# Patient Record
Sex: Female | Born: 1945 | Race: White | Hispanic: No | Smoking: Former smoker
Health system: Southern US, Community
[De-identification: ages and names within clinical notes are randomized; demographics above are authoritative.]

## PROBLEM LIST (undated history)

## (undated) ENCOUNTER — Emergency Department (HOSPITAL_COMMUNITY): Admission: EM | Discharge status: Against Medical Advice | Payer: Managed Care, Other (non HMO)

## (undated) DIAGNOSIS — I509 Heart failure, unspecified: Secondary | ICD-10-CM

## (undated) DIAGNOSIS — Z9889 Other specified postprocedural states: Secondary | ICD-10-CM

## (undated) DIAGNOSIS — E785 Hyperlipidemia, unspecified: Secondary | ICD-10-CM

## (undated) DIAGNOSIS — M1990 Unspecified osteoarthritis, unspecified site: Secondary | ICD-10-CM

## (undated) DIAGNOSIS — E559 Vitamin D deficiency, unspecified: Secondary | ICD-10-CM

## (undated) DIAGNOSIS — Z973 Presence of spectacles and contact lenses: Secondary | ICD-10-CM

## (undated) DIAGNOSIS — R112 Nausea with vomiting, unspecified: Secondary | ICD-10-CM

## (undated) DIAGNOSIS — K219 Gastro-esophageal reflux disease without esophagitis: Secondary | ICD-10-CM

## (undated) DIAGNOSIS — M25562 Pain in left knee: Secondary | ICD-10-CM

## (undated) DIAGNOSIS — Z86718 Personal history of other venous thrombosis and embolism: Principal | ICD-10-CM

## (undated) DIAGNOSIS — I10 Essential (primary) hypertension: Secondary | ICD-10-CM

## (undated) DIAGNOSIS — E538 Deficiency of other specified B group vitamins: Secondary | ICD-10-CM

## (undated) DIAGNOSIS — S83241A Other tear of medial meniscus, current injury, right knee, initial encounter: Secondary | ICD-10-CM

## (undated) DIAGNOSIS — I82409 Acute embolism and thrombosis of unspecified deep veins of unspecified lower extremity: Secondary | ICD-10-CM

## (undated) HISTORY — DX: Acute embolism and thrombosis of unspecified deep veins of unspecified lower extremity: I82.409

## (undated) HISTORY — DX: Heart failure, unspecified: I50.9

## (undated) HISTORY — DX: Hyperlipidemia, unspecified: E78.5

## (undated) HISTORY — DX: Other tear of medial meniscus, current injury, right knee, initial encounter: S83.241A

## (undated) HISTORY — PX: CYSTOSTOMY W/ BLADDER BIOPSY: SHX1431

## (undated) HISTORY — PX: BILATERAL SALPINGECTOMY: SHX5743

## (undated) HISTORY — PX: SKIN CANCER EXCISION: SHX779

## (undated) HISTORY — DX: Deficiency of other specified B group vitamins: E53.8

## (undated) HISTORY — PX: TONSILLECTOMY: SUR1361

## (undated) HISTORY — DX: Essential (primary) hypertension: I10

## (undated) HISTORY — DX: Pain in left knee: M25.562

## (undated) HISTORY — DX: Personal history of other venous thrombosis and embolism: Z86.718

## (undated) HISTORY — DX: Vitamin D deficiency, unspecified: E55.9

---

## 1973-07-05 HISTORY — PX: VAGINAL HYSTERECTOMY: SHX2639

## 1973-07-05 HISTORY — PX: APPENDECTOMY: SHX54

## 1973-07-05 HISTORY — PX: ABDOMINAL HYSTERECTOMY: SHX81

## 1987-07-06 HISTORY — PX: KNEE ARTHROSCOPY: SHX127

## 1997-12-18 ENCOUNTER — Encounter: Admission: RE | Admit: 1997-12-18 | Discharge: 1998-03-18 | Payer: Self-pay | Admitting: *Deleted

## 1998-01-17 ENCOUNTER — Ambulatory Visit (HOSPITAL_COMMUNITY): Admission: RE | Admit: 1998-01-17 | Discharge: 1998-01-17 | Payer: Self-pay | Admitting: Urology

## 1999-05-04 ENCOUNTER — Ambulatory Visit (HOSPITAL_COMMUNITY): Admission: RE | Admit: 1999-05-04 | Discharge: 1999-05-04 | Payer: Self-pay | Admitting: *Deleted

## 1999-12-25 ENCOUNTER — Other Ambulatory Visit: Admission: RE | Admit: 1999-12-25 | Discharge: 1999-12-25 | Payer: Self-pay | Admitting: Family Medicine

## 2000-01-22 ENCOUNTER — Encounter: Payer: Self-pay | Admitting: Family Medicine

## 2000-01-22 ENCOUNTER — Encounter: Admission: RE | Admit: 2000-01-22 | Discharge: 2000-01-22 | Payer: Self-pay

## 2000-03-29 ENCOUNTER — Ambulatory Visit (HOSPITAL_COMMUNITY): Admission: RE | Admit: 2000-03-29 | Discharge: 2000-03-29 | Payer: Self-pay | Admitting: Family Medicine

## 2000-03-29 ENCOUNTER — Encounter: Payer: Self-pay | Admitting: Family Medicine

## 2001-05-05 ENCOUNTER — Encounter: Admission: RE | Admit: 2001-05-05 | Discharge: 2001-05-05 | Payer: Self-pay | Admitting: Family Medicine

## 2001-05-05 ENCOUNTER — Encounter: Payer: Self-pay | Admitting: Family Medicine

## 2002-07-05 HISTORY — PX: REFRACTIVE SURGERY: SHX103

## 2003-12-24 ENCOUNTER — Emergency Department (HOSPITAL_COMMUNITY): Admission: EM | Admit: 2003-12-24 | Discharge: 2003-12-24 | Payer: Self-pay | Admitting: *Deleted

## 2003-12-25 ENCOUNTER — Ambulatory Visit (HOSPITAL_COMMUNITY): Admission: RE | Admit: 2003-12-25 | Discharge: 2003-12-25 | Payer: Self-pay | Admitting: *Deleted

## 2004-02-17 ENCOUNTER — Emergency Department (HOSPITAL_COMMUNITY): Admission: AC | Admit: 2004-02-17 | Discharge: 2004-02-17 | Payer: Self-pay

## 2004-03-03 ENCOUNTER — Ambulatory Visit (HOSPITAL_COMMUNITY): Admission: RE | Admit: 2004-03-03 | Discharge: 2004-03-03 | Payer: Self-pay | Admitting: Orthopedic Surgery

## 2004-12-21 ENCOUNTER — Other Ambulatory Visit: Admission: RE | Admit: 2004-12-21 | Discharge: 2004-12-21 | Payer: Self-pay | Admitting: Family Medicine

## 2005-07-05 DIAGNOSIS — I82409 Acute embolism and thrombosis of unspecified deep veins of unspecified lower extremity: Secondary | ICD-10-CM

## 2005-07-05 HISTORY — DX: Acute embolism and thrombosis of unspecified deep veins of unspecified lower extremity: I82.409

## 2005-07-05 HISTORY — PX: LEG SURGERY: SHX1003

## 2006-03-21 ENCOUNTER — Ambulatory Visit: Payer: Self-pay | Admitting: Gastroenterology

## 2006-12-29 ENCOUNTER — Encounter: Admission: RE | Admit: 2006-12-29 | Discharge: 2006-12-29 | Payer: Self-pay | Admitting: Family Medicine

## 2007-07-06 HISTORY — PX: CARPAL TUNNEL RELEASE: SHX101

## 2008-01-01 ENCOUNTER — Encounter: Admission: RE | Admit: 2008-01-01 | Discharge: 2008-01-01 | Payer: Self-pay | Admitting: Family Medicine

## 2008-07-23 ENCOUNTER — Ambulatory Visit: Payer: Self-pay | Admitting: Gastroenterology

## 2008-08-09 ENCOUNTER — Ambulatory Visit: Payer: Self-pay | Admitting: Gastroenterology

## 2009-12-23 ENCOUNTER — Encounter: Admission: RE | Admit: 2009-12-23 | Discharge: 2009-12-23 | Payer: Self-pay | Admitting: Family Medicine

## 2010-03-30 ENCOUNTER — Encounter
Admission: RE | Admit: 2010-03-30 | Discharge: 2010-06-28 | Payer: Self-pay | Source: Home / Self Care | Attending: Orthopedic Surgery | Admitting: Orthopedic Surgery

## 2010-07-26 ENCOUNTER — Encounter: Payer: Self-pay | Admitting: Family Medicine

## 2010-10-08 ENCOUNTER — Encounter (HOSPITAL_BASED_OUTPATIENT_CLINIC_OR_DEPARTMENT_OTHER)
Admission: RE | Admit: 2010-10-08 | Discharge: 2010-10-08 | Disposition: A | Discharge status: Home / Self Care | Payer: Managed Care, Other (non HMO) | Source: Ambulatory Visit | Attending: Specialist | Admitting: Specialist

## 2010-10-13 ENCOUNTER — Ambulatory Visit (HOSPITAL_BASED_OUTPATIENT_CLINIC_OR_DEPARTMENT_OTHER)
Admission: RE | Admit: 2010-10-13 | Discharge: 2010-10-13 | Disposition: A | Discharge status: Home / Self Care | Payer: Managed Care, Other (non HMO) | Source: Ambulatory Visit | Attending: Specialist | Admitting: Specialist

## 2010-10-13 DIAGNOSIS — E669 Obesity, unspecified: Secondary | ICD-10-CM | POA: Insufficient documentation

## 2010-10-13 DIAGNOSIS — G5600 Carpal tunnel syndrome, unspecified upper limb: Secondary | ICD-10-CM | POA: Insufficient documentation

## 2010-10-13 DIAGNOSIS — Z01812 Encounter for preprocedural laboratory examination: Secondary | ICD-10-CM | POA: Insufficient documentation

## 2010-10-13 LAB — POCT HEMOGLOBIN-HEMACUE: Hemoglobin: 14.7 g/dL (ref 12.0–15.0)

## 2010-10-24 NOTE — Op Note (Signed)
Kaitlin Watkins, Kaitlin Watkins NO.:  [id number]  MEDICAL RECORD NO.:  [id number]          PATIENT TYPE:  REC  LOCATION:  [facility]H                         FACILITY:  [facility]  PHYSICIAN:  Kerrin Champagne, M.D.   DATE OF BIRTH:  [birth year]  DATE OF PROCEDURE:  10/13/2010 DATE OF DISCHARGE:  06/28/2010                              OPERATIVE REPORT   PREOPERATIVE DIAGNOSIS:  Left moderately severe carpal tunnel syndrome.  POSTOPERATIVE DIAGNOSIS:  Left moderately severe carpal tunnel syndrome.  PROCEDURE:  Left open carpal tunnel release.  SURGEON:  Kerrin Champagne, M.D.  ASSISTANT:  None.  ANESTHESIA:  Regional using a Bier block at the upper forearm level. Dr. Krista Blue was the anesthesiologist of record.  Local anesthetic, Marcaine 0.5% plain, 7 mL.  FINDINGS:  Waist-like constriction of left median nerve and transverse carpal ligament.  ESTIMATED BLOOD LOSS:  10 mL.  COMPLICATIONS:  None.  The patient returned to the PACU in good condition.  HISTORY OF PRESENT ILLNESS:  A 65 year old right-handed female.  She has been experiencing worsening severe pain in the left arm, particularly at nighttime.  Pain was repetitive usually to the left hand when driving. She also is experiencing neck pain.  She underwent an evaluation of her neck, left upper extremity EMGs, nerve conduction studies demonstrating moderately severe left carpal tunnel syndrome.  She has not responded to use of splint.  She reports that after a period of 6 weeks of attempts of using the splint, whenever she removes the splint, the pain into her left hand, numbness and paresthesias returned.  She is finding the pain is keeping her awake at nighttime.  She has elected to undergo left open carpal tunnel release.  DESCRIPTION OF PROCEDURE:  This patient was seen in the preoperative holding area.  All questions were answered.  The patient has signed informed consent regarding the left open carpal tunnel  release.  She was taken to the OR via stretcher to OR room #6 at the [facility].  There, she underwent a left upper extremity Bier block at the upper forearm level with tourniquet intact.  Exsanguinating, the left arm was then instilled with the anesthetic agent.  Dr. Krista Blue is the anesthesiologist of record.  Following this, she underwent prep with DuraPrep solution in the left upper extremity from fingertips to the left upper forearm draped in the usual manner.  The loupe magnifications were used during this procedure.  The patient had testing of the skin to ensure that she was insensate over the area of the expected incision, an inch and a half length incision over the left volar wrist crease just ulnar to the thenar crease.  The incision was curved at the wrist creases to prevent scarring.  Incision through the skin and subcu layers using a 15 blade scalpel.  The deeper subcu layers were spread using a Stevens scissors to the volar fascia overlying the distal forearm and the transverse carpal ligament.  Initially, Ragnell retractors were used and then later a small Weitlaner was placed to open the incision widely. The distal forearm fascia was grasped over the ulnar aspect  of the palmaris longus tendon and then incised using the [employer].  The median nerve identified.  Then the volar fascia of the forearm was then released from proximal and distal holding the skin up, elevating away from the fascia while using loupe magnification, the incision of the fascia proximally from the incision of the wrist.  Next, the transverse carpal ligament was identified and a Therapist, nutritional passed beneath the transverse carpal ligament.  The transverse carpal ligament was then divided using a 15 blade scalpel distally.  The transverse carpal ligament was then released.  A tenotomy scissors were then used to further divide the superficial fascia and palmaris fascia distally  until the palmar arch was identified.  Motor branch of the median nerve was identified to be intact.  The median artery filled nicely following release.  The median nerve itself observed to be waist like constricted beneath the transverse carpal ligament that was found to be hypertrophic.  At this point, then a tourniquet was released and small bleeders were cauterized using bipolar electrocautery.  Bleeding was completely hemostased and the incision was closed following irrigation with copious amounts of normal saline solution.  Incision closed with a single layer of 4-0 nylon suture using simple sutures and a horizontal mattress suture flap stitch at the point of the incision.  Adaptic, 4x4's affixed to the skin with sterile Webril, a well-padded volar splint applied, the wrist at 30 degrees dorsiflexion.  All instrument and sponge counts were correct.  Note that prior to our incision, prior to infiltration of the skin with Marcaine 0.5% plain, the patient had standard time-out protocol carried out identifying the patient's side of the procedure, expected length of time, and blood loss.  POSTSURGICAL CARE:  The patient will be discharged from the [facility].  She will elevate the left wrist and hand for a period of 48 hours,  use ice 2 hours on the hand for 1 day.  Norco 5/325, #40 with one free refill, 1 or 2 every 4-6 hours p.r.n. pain.  Will be seen back in my office at [facility] with Dr. Otelia Sergeant, phone number listed at [phone].     Kerrin Champagne, M.D.     JEN/MEDQ  D:  10/13/2010  T:  10/13/2010  Job:  [id number]  Electronically Signed by Vira Browns M.D. on 10/24/2010 07:33:35 AM

## 2010-11-13 ENCOUNTER — Ambulatory Visit: Payer: Managed Care, Other (non HMO) | Attending: Specialist | Admitting: Physical Therapy

## 2010-11-13 DIAGNOSIS — M25569 Pain in unspecified knee: Secondary | ICD-10-CM | POA: Insufficient documentation

## 2010-11-13 DIAGNOSIS — IMO0001 Reserved for inherently not codable concepts without codable children: Secondary | ICD-10-CM | POA: Insufficient documentation

## 2010-11-13 DIAGNOSIS — R5381 Other malaise: Secondary | ICD-10-CM | POA: Insufficient documentation

## 2010-11-13 DIAGNOSIS — M6281 Muscle weakness (generalized): Secondary | ICD-10-CM | POA: Insufficient documentation

## 2010-11-18 ENCOUNTER — Ambulatory Visit: Payer: Managed Care, Other (non HMO) | Admitting: Physical Therapy

## 2010-11-25 ENCOUNTER — Ambulatory Visit: Payer: Managed Care, Other (non HMO) | Admitting: Physical Therapy

## 2010-12-02 ENCOUNTER — Encounter: Payer: Managed Care, Other (non HMO) | Admitting: Physical Therapy

## 2010-12-03 ENCOUNTER — Ambulatory Visit: Payer: Managed Care, Other (non HMO) | Admitting: Physical Therapy

## 2010-12-09 ENCOUNTER — Ambulatory Visit: Payer: Managed Care, Other (non HMO) | Attending: Specialist | Admitting: Physical Therapy

## 2010-12-09 DIAGNOSIS — IMO0001 Reserved for inherently not codable concepts without codable children: Secondary | ICD-10-CM | POA: Insufficient documentation

## 2010-12-09 DIAGNOSIS — M6281 Muscle weakness (generalized): Secondary | ICD-10-CM | POA: Insufficient documentation

## 2010-12-09 DIAGNOSIS — R5381 Other malaise: Secondary | ICD-10-CM | POA: Insufficient documentation

## 2010-12-09 DIAGNOSIS — M25569 Pain in unspecified knee: Secondary | ICD-10-CM | POA: Insufficient documentation

## 2011-06-23 ENCOUNTER — Ambulatory Visit (HOSPITAL_COMMUNITY)
Admission: RE | Admit: 2011-06-23 | Discharge: 2011-06-23 | Disposition: A | Discharge status: Home / Self Care | Payer: Managed Care, Other (non HMO) | Source: Ambulatory Visit | Attending: Family Medicine | Admitting: Family Medicine

## 2011-06-23 DIAGNOSIS — M79609 Pain in unspecified limb: Secondary | ICD-10-CM | POA: Insufficient documentation

## 2011-06-23 DIAGNOSIS — M7989 Other specified soft tissue disorders: Secondary | ICD-10-CM | POA: Insufficient documentation

## 2011-06-23 DIAGNOSIS — R52 Pain, unspecified: Secondary | ICD-10-CM

## 2011-06-23 NOTE — Progress Notes (Signed)
PRELIMINARY  PRELIMINARY  PRELIMINARY  PRELIMINARY  Left lower extremity venous duplex completed.    Preliminary report:  Left:  No evidence of DVT, superficial thrombosis or Baker's cyst.   Terance Hart, RVT 06/23/2011 6:29 PM

## 2011-10-12 ENCOUNTER — Ambulatory Visit (HOSPITAL_COMMUNITY)
Admission: RE | Admit: 2011-10-12 | Discharge: 2011-10-12 | Disposition: A | Discharge status: Home / Self Care | Payer: Managed Care, Other (non HMO) | Source: Ambulatory Visit | Attending: Family Medicine | Admitting: Family Medicine

## 2011-10-12 ENCOUNTER — Other Ambulatory Visit: Payer: Self-pay | Admitting: Family Medicine

## 2011-10-12 DIAGNOSIS — M79661 Pain in right lower leg: Secondary | ICD-10-CM

## 2011-10-12 DIAGNOSIS — M79609 Pain in unspecified limb: Secondary | ICD-10-CM | POA: Insufficient documentation

## 2011-10-12 DIAGNOSIS — M79662 Pain in left lower leg: Secondary | ICD-10-CM

## 2011-10-12 DIAGNOSIS — M7989 Other specified soft tissue disorders: Secondary | ICD-10-CM | POA: Insufficient documentation

## 2012-05-10 ENCOUNTER — Other Ambulatory Visit: Payer: Self-pay | Admitting: Otolaryngology

## 2012-05-10 DIAGNOSIS — J309 Allergic rhinitis, unspecified: Secondary | ICD-10-CM

## 2012-05-10 DIAGNOSIS — R0989 Other specified symptoms and signs involving the circulatory and respiratory systems: Secondary | ICD-10-CM

## 2012-05-10 DIAGNOSIS — J342 Deviated nasal septum: Secondary | ICD-10-CM

## 2012-05-10 DIAGNOSIS — J329 Chronic sinusitis, unspecified: Secondary | ICD-10-CM

## 2012-05-16 ENCOUNTER — Ambulatory Visit
Admission: RE | Admit: 2012-05-16 | Discharge: 2012-05-16 | Disposition: A | Discharge status: Home / Self Care | Payer: Managed Care, Other (non HMO) | Source: Ambulatory Visit | Attending: Otolaryngology | Admitting: Otolaryngology

## 2012-05-16 DIAGNOSIS — J309 Allergic rhinitis, unspecified: Secondary | ICD-10-CM

## 2012-05-16 DIAGNOSIS — R0989 Other specified symptoms and signs involving the circulatory and respiratory systems: Secondary | ICD-10-CM

## 2012-05-16 DIAGNOSIS — J329 Chronic sinusitis, unspecified: Secondary | ICD-10-CM

## 2012-05-16 DIAGNOSIS — J342 Deviated nasal septum: Secondary | ICD-10-CM

## 2012-05-23 ENCOUNTER — Ambulatory Visit (INDEPENDENT_AMBULATORY_CARE_PROVIDER_SITE_OTHER): Payer: Managed Care, Other (non HMO)

## 2012-05-23 ENCOUNTER — Other Ambulatory Visit: Payer: Self-pay | Admitting: Family Medicine

## 2012-05-23 DIAGNOSIS — R928 Other abnormal and inconclusive findings on diagnostic imaging of breast: Secondary | ICD-10-CM

## 2012-05-23 DIAGNOSIS — Z1231 Encounter for screening mammogram for malignant neoplasm of breast: Secondary | ICD-10-CM

## 2012-06-08 ENCOUNTER — Other Ambulatory Visit: Payer: Self-pay | Admitting: Family Medicine

## 2012-06-08 DIAGNOSIS — R928 Other abnormal and inconclusive findings on diagnostic imaging of breast: Secondary | ICD-10-CM

## 2012-06-19 ENCOUNTER — Ambulatory Visit
Admission: RE | Admit: 2012-06-19 | Discharge: 2012-06-19 | Disposition: A | Discharge status: Home / Self Care | Payer: Managed Care, Other (non HMO) | Source: Ambulatory Visit | Attending: Family Medicine | Admitting: Family Medicine

## 2012-06-19 ENCOUNTER — Other Ambulatory Visit: Payer: Self-pay | Admitting: Family Medicine

## 2012-06-19 DIAGNOSIS — R928 Other abnormal and inconclusive findings on diagnostic imaging of breast: Secondary | ICD-10-CM

## 2012-06-19 HISTORY — PX: BREAST BIOPSY: SHX20

## 2013-03-13 ENCOUNTER — Emergency Department (HOSPITAL_BASED_OUTPATIENT_CLINIC_OR_DEPARTMENT_OTHER)
Admission: EM | Admit: 2013-03-13 | Discharge: 2013-03-13 | Disposition: A | Discharge status: Home / Self Care | Payer: Medicare Other | Attending: Emergency Medicine | Admitting: Emergency Medicine

## 2013-03-13 ENCOUNTER — Encounter (HOSPITAL_BASED_OUTPATIENT_CLINIC_OR_DEPARTMENT_OTHER): Payer: Self-pay

## 2013-03-13 ENCOUNTER — Ambulatory Visit: Payer: Managed Care, Other (non HMO) | Admitting: Family Medicine

## 2013-03-13 ENCOUNTER — Emergency Department (HOSPITAL_BASED_OUTPATIENT_CLINIC_OR_DEPARTMENT_OTHER): Payer: Medicare Other

## 2013-03-13 DIAGNOSIS — M25561 Pain in right knee: Secondary | ICD-10-CM

## 2013-03-13 DIAGNOSIS — R52 Pain, unspecified: Secondary | ICD-10-CM | POA: Insufficient documentation

## 2013-03-13 DIAGNOSIS — K219 Gastro-esophageal reflux disease without esophagitis: Secondary | ICD-10-CM | POA: Insufficient documentation

## 2013-03-13 DIAGNOSIS — Z79899 Other long term (current) drug therapy: Secondary | ICD-10-CM | POA: Insufficient documentation

## 2013-03-13 DIAGNOSIS — M25569 Pain in unspecified knee: Secondary | ICD-10-CM | POA: Insufficient documentation

## 2013-03-13 HISTORY — DX: Gastro-esophageal reflux disease without esophagitis: K21.9

## 2013-03-13 MED ORDER — TRAMADOL HCL 50 MG PO TABS: 50.0000 mg | ORAL_TABLET | Freq: Four times a day (QID) | ORAL | Status: DC | PRN

## 2013-03-13 NOTE — ED Notes (Signed)
Pt c/o right knee pain started spontaneously last Thursday

## 2013-03-13 NOTE — ED Provider Notes (Signed)
CSN: [id number]     Arrival date & time 03/13/13  1529 History   First MD Initiated Contact with Patient 03/13/13 1550     Chief Complaint  Patient presents with  . Knee Pain   (Consider location/radiation/quality/duration/timing/severity/associated sxs/prior Treatment) HPI Comments: Patient presents with complaints of right knee pain. Patient reports that the pain began last week. She denies injury. She does have chronic problems with the other knee, however. Patient reports that she has no pain if she is sitting down, when she stands up , the pain is severe. She has not had redness or swelling.  Patient is a 67 y.o. female presenting with knee pain.  Knee Pain   Past Medical History  Diagnosis Date  . GERD (gastroesophageal reflux disease)    Past Surgical History  Procedure Laterality Date  . Knee arthroscopy    . Tonsillectomy    . Abdominal hysterectomy    . Appendectomy     No family history on file. History  Substance Use Topics  . Smoking status: Never Smoker   . Smokeless tobacco: Not on file  . Alcohol Use: No   OB History   Grav Para Term Preterm Abortions TAB SAB Ect Mult Living                 Review of Systems  Musculoskeletal: Positive for arthralgias.  Skin: Negative.     Allergies  Eggs or egg-derived products  Home Medications   Current Outpatient Rx  Name  Route  Sig  Dispense  Refill  . esomeprazole (NEXIUM) 40 MG capsule   Oral   Take 40 mg by mouth daily before breakfast.          BP 185/84  Pulse 75  Temp(Src) 98.5 F (36.9 C) (Oral)  Resp 16  Ht 5\' 6"  (1.676 m)  Wt 210 lb (95.255 kg)  BMI 33.91 kg/m2  SpO2 99% Physical Exam  Musculoskeletal:       Right knee: She exhibits normal range of motion, no swelling, no effusion, no ecchymosis, no deformity and no erythema. Tenderness found. Medial joint line tenderness noted.       Right lower leg: She exhibits no tenderness and no swelling.  Skin: Skin is warm, dry and intact. No  erythema.    ED Course  Procedures (including critical care time) Labs Review Labs Reviewed - No data to display Imaging Review Dg Knee 1-2 Views Right  03/13/2013   *RADIOLOGY REPORT*  Clinical Data: Right knee pain, no known injury  RIGHT KNEE - 1-2 VIEW  Comparison: None.  Findings: Two views of the right knee submitted.  No acute fracture or subluxation.  Minimal spurring of the tibial plateau.  Mild narrowing of patellofemoral joint space.  IMPRESSION: No acute fracture or subluxation.  Mild degenerative changes.   Original Report Authenticated By: Natasha Mead, M.D.    MDM  Diagnosis: Knee pain, arthirtis  Patient presents with complaints of atraumatic right knee pain. There is no appreciable joint effusion and overlying redness or warmth to suggest infection. X-ray shows mild degenerative changes, no acute pathology.    Gilda Crease, MD 03/13/13 [phone]

## 2013-03-19 ENCOUNTER — Ambulatory Visit (INDEPENDENT_AMBULATORY_CARE_PROVIDER_SITE_OTHER): Payer: Medicare Other | Admitting: Family Medicine

## 2013-03-19 ENCOUNTER — Encounter: Payer: Self-pay | Admitting: Family Medicine

## 2013-03-19 VITALS — BP 142/89 | HR 70 | Temp 97.7°F | Ht 66.0 in | Wt 204.2 lb

## 2013-03-19 DIAGNOSIS — M25561 Pain in right knee: Secondary | ICD-10-CM

## 2013-03-19 DIAGNOSIS — K219 Gastro-esophageal reflux disease without esophagitis: Secondary | ICD-10-CM | POA: Insufficient documentation

## 2013-03-19 DIAGNOSIS — M25569 Pain in unspecified knee: Secondary | ICD-10-CM

## 2013-03-19 MED ORDER — MELOXICAM 15 MG PO TABS: 15.0000 mg | ORAL_TABLET | Freq: Every day | ORAL | Status: DC

## 2013-03-19 NOTE — Progress Notes (Signed)
Patient ID: Kaitlin Watkins, female   DOB: 01-06-1946, 67 y.o.   MRN: [id number] SUBJECTIVE: CC: Chief Complaint  Patient presents with  . Acute Visit    riight knee pain was seen at [facility] had xr's     HPI: Right knee has been painful. Used an ACE wrap. Evaluated at [facility]. Xrays done. The Ed doctor thought that hse may need a MRI if not better.  Past Medical History  Diagnosis Date  . GERD (gastroesophageal reflux disease)    Past Surgical History  Procedure Laterality Date  . Knee arthroscopy    . Tonsillectomy    . Abdominal hysterectomy    . Appendectomy     History   Social History  . Marital Status: Married    Spouse Name: N/A    Number of Children: N/A  . Years of Education: N/A   Occupational History  . Not on file.   Social History Main Topics  . Smoking status: Never Smoker   . Smokeless tobacco: Not on file  . Alcohol Use: No  . Drug Use: No  . Sexual Activity: Not on file   Other Topics Concern  . Not on file   Social History Narrative  . No narrative on file   No family history on file. Current Outpatient Prescriptions on File Prior to Visit  Medication Sig Dispense Refill  . esomeprazole (NEXIUM) 40 MG capsule Take 40 mg by mouth daily before breakfast.      . traMADol (ULTRAM) 50 MG tablet Take 1 tablet (50 mg total) by mouth every 6 (six) hours as needed for pain.  15 tablet  0   No current facility-administered medications on file prior to visit.   Allergies  Allergen Reactions  . Eggs Or Egg-Derived Products     Patient can eat eggs but cannot have a flu shot    There is no immunization history on file for this patient. Prior to Admission medications   Medication Sig Start Date End Date Taking? Authorizing Provider  esomeprazole (NEXIUM) 40 MG capsule Take 40 mg by mouth daily before breakfast.    Historical Provider, MD  meloxicam (MOBIC) 15 MG tablet Take 1 tablet (15 mg total) by mouth daily. 03/19/13    Ileana Ladd, MD  traMADol (ULTRAM) 50 MG tablet Take 1 tablet (50 mg total) by mouth every 6 (six) hours as needed for pain. 03/13/13   Gilda Crease, MD     ROS: As above in the HPI. All other systems are stable or negative.  OBJECTIVE: APPEARANCE:  Patient in no acute distress.The patient appeared well nourished and normally developed. Acyanotic. Waist: VITAL SIGNS:BP 142/89  Pulse 70  Temp(Src) 97.7 F (36.5 C) (Oral)  Ht 5\' 6"  (1.676 m)  Wt 204 lb 3.2 oz (92.625 kg)  BMI 32.97 kg/m2 WF Limping obese  SKIN: warm and  Dry without overt rashes, tattoos and scars  HEAD and Neck: without JVD, Head and scalp: normal Eyes:No scleral icterus. Fundi normal, eye movements normal. Ears: Auricle normal, canal normal, Tympanic membranes normal, insufflation normal. Nose: normal Throat: normal Neck & thyroid: normal  CHEST & LUNGS: Chest wall: normal Lungs: Clear  CVS: Reveals the PMI to be normally located. Regular rhythm, First and Second Heart sounds are normal,  absence of murmurs, rubs or gallops. Peripheral vasculature: Radial pulses: normal Dorsal pedis pulses: normal Posterior pulses: normal  ABDOMEN:  Appearance: normal Benign, no organomegaly, no masses, no Abdominal  Aortic enlargement. No Guarding , no rebound. No Bruits. Bowel sounds: normal  RECTAL: N/A GU: N/A  EXTREMETIES: nonedematous.  MUSCULOSKELETAL:  Spine: normal Joints: right knee puffy. painfull on ROM. The grind test is positive and painful to extend. Ligaments intact. Tender along the joint line. Painful to weight bear.   NEUROLOGIC: oriented to time,place and person; nonfocal. Strength is normal Sensory is normal Reflexes are normal Cranial Nerves are normal.  Results for orders placed during the hospital encounter of 10/13/10  POCT HEMOGLOBIN-HEMACUE      Result Value Range   Hemoglobin 14.7  12.0 - 15.0 g/dL    ASSESSMENT:   PLAN:  Orders Placed This  Encounter  Procedures  . MR Knee Right Wo Contrast    Standing Status: Future     Number of Occurrences:      Standing Expiration Date: 05/19/2014    Order Specific Question:  Reason for Exam (SYMPTOM  OR DIAGNOSIS REQUIRED)    Answer:  right knee pain unable to bear weight, possible meniscus injury.    Order Specific Question:  Preferred imaging location?    Answer:  [facility]    Order Specific Question:  Does the patient have a pacemaker, internal devices, implants, aneury    Answer:  No  eviewed Xray report in EPIC.  Meds ordered this encounter  Medications  . meloxicam (MOBIC) 15 MG tablet    Sig: Take 1 tablet (15 mg total) by mouth daily.    Dispense:  30 tablet    Refill:  0    Use knee brace in meantime.  Note for work. Lifestyle changes dietary changes.  Follow up pending MRI  Nichole Neyer P. Modesto Charon, M.D.    Follow up pending MRI. ICe elevate  r

## 2013-03-26 ENCOUNTER — Telehealth: Payer: Self-pay | Admitting: Family Medicine

## 2013-03-26 ENCOUNTER — Encounter: Payer: Self-pay | Admitting: Family Medicine

## 2013-03-26 ENCOUNTER — Ambulatory Visit (INDEPENDENT_AMBULATORY_CARE_PROVIDER_SITE_OTHER): Payer: Medicare Other | Admitting: Family Medicine

## 2013-03-26 VITALS — BP 149/88 | HR 74 | Resp 16 | Ht 65.0 in | Wt 206.0 lb

## 2013-03-26 DIAGNOSIS — I10 Essential (primary) hypertension: Secondary | ICD-10-CM

## 2013-03-26 DIAGNOSIS — S83241D Other tear of medial meniscus, current injury, right knee, subsequent encounter: Secondary | ICD-10-CM

## 2013-03-26 DIAGNOSIS — Z5189 Encounter for other specified aftercare: Secondary | ICD-10-CM

## 2013-03-26 DIAGNOSIS — E669 Obesity, unspecified: Secondary | ICD-10-CM

## 2013-03-26 DIAGNOSIS — K219 Gastro-esophageal reflux disease without esophagitis: Secondary | ICD-10-CM

## 2013-03-26 NOTE — Progress Notes (Signed)
Subjective:    Patient ID: Kaitlin Watkins, female    DOB: [birth year], 67 y.o.   MRN: [id number]  HPI  Kaitlin Watkins is here today to establish care with our office. She was referred to [place] by one of her co-workers Kaitlin Watkins) who did the [facility] Ed program with our office.  She has been having a significant about of pain in her right knee and feels that if she could lose weight, she would feel a lot better.  She was recently seen by a practice in [place] and is awaiting to hear from them about when she can get a MRI.      Review of Systems  Constitutional: Negative.   HENT: Negative.   Eyes: Negative.   Respiratory: Negative.   Cardiovascular: Negative.   Gastrointestinal: Negative.   Endocrine: Negative.   Genitourinary: Negative.   Musculoskeletal:       Right Knee Pain  Skin: Negative.   Allergic/Immunologic: Negative.   Neurological: Negative.   Hematological: Negative.   Psychiatric/Behavioral: Negative.      Past Medical History  Diagnosis Date  . GERD (gastroesophageal reflux disease)   . Hyperlipidemia   . DVT (deep venous thrombosis) 1950    Left Leg     Family History  Problem Relation Age of Onset  . Heart disease Mother     CHF  . Stroke Mother 87  . Cancer Mother 63    Cerivcal Cancer  . Stroke Father 30  . Alcohol abuse Father   . Heart disease Sister 30    MI  . Heart disease Brother   . Stroke Brother   . Heart disease Brother 50    MI  . Cancer Brother 48    Prostate     History   Social History Narrative   Marital Status: Married Brewing technologist)    Children:  Daughter Kaitlin Watkins) Lives in [place]    Pets: None   Living Situation: Lives with husband   Occupation: Teacher, English as a foreign language Engineer, mining)   Education: Engineer, agricultural    Tobacco Use/Exposure:  Former Smoker (She started smoking at the age of 22 and quit at the age of 67).     Alcohol Use:  3-4 drinks / week   Drug Use:  None   Diet:  Regular   Exercise:  None   Hobbies: Motorcycle Riding     Objective:   Physical Exam  Vitals reviewed. Constitutional: She is oriented to person, place, and time.  Eyes: Conjunctivae are normal. No scleral icterus.  Neck: Neck supple. No thyromegaly present.  Cardiovascular: Normal rate, regular rhythm and normal heart sounds.   Pulmonary/Chest: Effort normal and breath sounds normal.  Musculoskeletal: She exhibits edema and tenderness (Right Knee ).  She is on crutches.  Lymphadenopathy:    She has no cervical adenopathy.  Neurological: She is alert and oriented to person, place, and time.  Skin: Skin is warm and dry.  Psychiatric: She has a normal mood and affect. Her behavior is normal. Judgment and thought content normal.      Assessment & Plan:    Kaitlin Watkins was seen today for establish care.  Diagnoses and associated orders for this visit:  Essential hypertension, benign Comments: Her BP is elevatd.  An EKG was checked with was WNL.  She was given samples of Benicar and a handout on the DASH diet.   - EKG 12-Lead  Obesity, unspecified Comments: We discussed the HCG program.    Medial meniscus tear, right, subsequent encounter  Comments: We sent for a MRI of her right knee which showed a medial meniscus tear.    GERD (gastroesophageal reflux disease) Comments: She is to contact [facility] for an appointment.

## 2013-03-26 NOTE — Progress Notes (Deleted)
  Subjective:    Patient ID: Kaitlin Watkins, female    DOB: [birth year], 67 y.o.   MRN: [id number]  HPI    Review of Systems     Objective:   Physical Exam        Assessment & Plan:

## 2013-03-26 NOTE — Patient Instructions (Addendum)
1)  BP - Take 1/2 - 1 tab of the Benicar every morning for your BP.    2)  Weight - Read the book "Pounds & Inches". We'll let you start the diet in a week assuming your BP is WNL.      DASH Diet The DASH diet stands for "Dietary Approaches to Stop Hypertension." It is a healthy eating plan that has been shown to reduce high blood pressure (hypertension) in as little as 14 days, while also possibly providing other significant health benefits. These other health benefits include reducing the risk of breast cancer after menopause and reducing the risk of type 2 diabetes, heart disease, colon cancer, and stroke. Health benefits also include weight loss and slowing kidney failure in patients with chronic kidney disease.  DIET GUIDELINES  Limit salt (sodium). Your diet should contain less than 1500 mg of sodium daily.  Limit refined or processed carbohydrates. Your diet should include mostly whole grains. Desserts and added sugars should be used sparingly.  Include small amounts of heart-healthy fats. These types of fats include nuts, oils, and tub margarine. Limit saturated and trans fats. These fats have been shown to be harmful in the body. CHOOSING FOODS  The following food groups are based on a 2000 calorie diet. See your Registered Dietitian for individual calorie needs. Grains and Grain Products (6 to 8 servings daily)  Eat More Often: Whole-wheat bread, brown rice, whole-grain or wheat pasta, quinoa, popcorn without added fat or salt (air popped).  Eat Less Often: White bread, white pasta, white rice, cornbread. Vegetables (4 to 5 servings daily)  Eat More Often: Fresh, frozen, and canned vegetables. Vegetables may be raw, steamed, roasted, or grilled with a minimal amount of fat.  Eat Less Often/Avoid: Creamed or fried vegetables. Vegetables in a cheese sauce. Fruit (4 to 5 servings daily)  Eat More Often: All fresh, canned (in natural juice), or frozen fruits. Dried fruits without  added sugar. One hundred percent fruit juice ( cup [237 mL] daily).  Eat Less Often: Dried fruits with added sugar. Canned fruit in light or heavy syrup. [employer], Fish, and Poultry (2 servings or less daily. One serving is 3 to 4 oz [85-114 g]).  Eat More Often: Ninety percent or leaner ground beef, tenderloin, sirloin. Round cuts of beef, chicken breast, [place] breast. All fish. Grill, bake, or broil your meat. Nothing should be fried.  Eat Less Often/Avoid: Fatty cuts of meat, [place], or chicken leg, thigh, or wing. Fried cuts of meat or fish. Dairy (2 to 3 servings)  Eat More Often: Low-fat or fat-free milk, low-fat plain or light yogurt, reduced-fat or part-skim cheese.  Eat Less Often/Avoid: Milk (whole, 2%).Whole milk yogurt. Full-fat cheeses. Nuts, Seeds, and Legumes (4 to 5 servings per week)  Eat More Often: All without added salt.  Eat Less Often/Avoid: Salted nuts and seeds, canned beans with added salt. Fats and Sweets (limited)  Eat More Often: Vegetable oils, tub margarines without trans fats, sugar-free gelatin. Mayonnaise and salad dressings.  Eat Less Often/Avoid: Coconut oils, palm oils, butter, stick margarine, cream, half and half, cookies, candy, pie. FOR MORE INFORMATION The Dash Diet Eating Plan: www.dashdiet.org Document Released: 06/10/2011 Document Revised: 09/13/2011 Document Reviewed: 06/10/2011 [facility] Patient Information 2014 [place], [place].

## 2013-03-27 ENCOUNTER — Ambulatory Visit (INDEPENDENT_AMBULATORY_CARE_PROVIDER_SITE_OTHER): Payer: Medicare Other

## 2013-03-27 ENCOUNTER — Other Ambulatory Visit: Payer: Self-pay | Admitting: Family Medicine

## 2013-03-27 DIAGNOSIS — M25561 Pain in right knee: Secondary | ICD-10-CM

## 2013-03-27 DIAGNOSIS — IMO0002 Reserved for concepts with insufficient information to code with codable children: Secondary | ICD-10-CM

## 2013-03-27 DIAGNOSIS — M25569 Pain in unspecified knee: Secondary | ICD-10-CM

## 2013-03-27 DIAGNOSIS — S83209A Unspecified tear of unspecified meniscus, current injury, unspecified knee, initial encounter: Secondary | ICD-10-CM

## 2013-03-27 DIAGNOSIS — M2240 Chondromalacia patellae, unspecified knee: Secondary | ICD-10-CM

## 2013-03-27 DIAGNOSIS — X58XXXA Exposure to other specified factors, initial encounter: Secondary | ICD-10-CM

## 2013-03-27 DIAGNOSIS — M2390 Unspecified internal derangement of unspecified knee: Secondary | ICD-10-CM

## 2013-03-27 NOTE — Telephone Encounter (Signed)
Pt notified and will put work note at front office

## 2013-03-27 NOTE — Progress Notes (Signed)
Quick Note:  Abnormal MRI Meniscus tear. Needs to see orthopedics. Done in EPIC ______

## 2013-03-27 NOTE — Telephone Encounter (Signed)
Okay to take her out until Thursday

## 2013-03-28 ENCOUNTER — Encounter: Payer: Self-pay | Admitting: Family Medicine

## 2013-03-28 DIAGNOSIS — I10 Essential (primary) hypertension: Secondary | ICD-10-CM | POA: Insufficient documentation

## 2013-03-28 DIAGNOSIS — E669 Obesity, unspecified: Secondary | ICD-10-CM | POA: Insufficient documentation

## 2013-03-28 DIAGNOSIS — S83249A Other tear of medial meniscus, current injury, unspecified knee, initial encounter: Secondary | ICD-10-CM | POA: Insufficient documentation

## 2013-03-28 NOTE — Assessment & Plan Note (Signed)
We had a long discussion about the HCG diet.  She understands that if she can follow it exactly she can lose 20 lbs in 30 days.  She is very interested in doing so.  She is to return in a week for a recheck of her BP before starting the plan.

## 2013-03-28 NOTE — Assessment & Plan Note (Signed)
Kaitlin Watkins really just came to me today for her weight but mentioned that she was awaiting word on her MRI.  I told her that I did not think it had to pre-certified if she had Medicare and this was confirmed by radiology so she was sent to [facility] for a MRI of the right knee.  It does show a tear in the medial meniscus.  She previously had knee surgery with Dr. Charlann Boxer.  His office is to contact her for an appointment.

## 2013-03-28 NOTE — Assessment & Plan Note (Addendum)
Kaitlin Watkins's BP is too high today for me to give her an appetite suppressant.  We checked an EKG which showed Q waves in the inferior leads.  I compared it to her EKG from 2012 which was done by Dr. [place] and it looked the same.  I gave her samples of Benicar to take for her BP and let her borrow our Omron machine so she can check her pressure at home.  She is to bring in her readings.  If her BP is down to normal then she can start on the Phendimetrazine.

## 2013-03-28 NOTE — Assessment & Plan Note (Signed)
Her GERD will improve with weight loss.

## 2013-03-29 ENCOUNTER — Telehealth: Payer: Self-pay | Admitting: Family Medicine

## 2013-04-02 ENCOUNTER — Ambulatory Visit (INDEPENDENT_AMBULATORY_CARE_PROVIDER_SITE_OTHER): Payer: Medicare Other | Admitting: Family Medicine

## 2013-04-02 ENCOUNTER — Encounter: Payer: Self-pay | Admitting: Family Medicine

## 2013-04-02 VITALS — BP 110/77 | HR 79 | Resp 16 | Wt 202.0 lb

## 2013-04-02 DIAGNOSIS — R7983 Abnormal findings of blood amino-acid level: Secondary | ICD-10-CM

## 2013-04-02 DIAGNOSIS — M25569 Pain in unspecified knee: Secondary | ICD-10-CM

## 2013-04-02 DIAGNOSIS — E7210 Disorders of sulfur-bearing amino-acid metabolism, unspecified: Secondary | ICD-10-CM

## 2013-04-02 DIAGNOSIS — K219 Gastro-esophageal reflux disease without esophagitis: Secondary | ICD-10-CM

## 2013-04-02 DIAGNOSIS — I10 Essential (primary) hypertension: Secondary | ICD-10-CM

## 2013-04-02 DIAGNOSIS — E559 Vitamin D deficiency, unspecified: Secondary | ICD-10-CM

## 2013-04-02 DIAGNOSIS — E785 Hyperlipidemia, unspecified: Secondary | ICD-10-CM

## 2013-04-02 DIAGNOSIS — R5381 Other malaise: Secondary | ICD-10-CM

## 2013-04-02 DIAGNOSIS — M25562 Pain in left knee: Secondary | ICD-10-CM

## 2013-04-02 LAB — CBC WITH DIFFERENTIAL/PLATELET
Basophils Absolute: 0 10*3/uL (ref 0.0–0.1)
Basophils Relative: 1 % (ref 0–1)
Eosinophils Absolute: 0.2 10*3/uL (ref 0.0–0.7)
Eosinophils Relative: 5 % (ref 0–5)
HCT: 43.7 % (ref 36.0–46.0)
Hemoglobin: 15 g/dL (ref 12.0–15.0)
Lymphocytes Relative: 29 % (ref 12–46)
Lymphs Abs: 1.3 10*3/uL (ref 0.7–4.0)
MCH: 28.8 pg (ref 26.0–34.0)
MCHC: 34.3 g/dL (ref 30.0–36.0)
MCV: 83.9 fL (ref 78.0–100.0)
Monocytes Absolute: 0.6 10*3/uL (ref 0.1–1.0)
Monocytes Relative: 12 % (ref 3–12)
Neutro Abs: 2.4 10*3/uL (ref 1.7–7.7)
Neutrophils Relative %: 53 % (ref 43–77)
Platelets: 276 10*3/uL (ref 150–400)
RBC: 5.21 MIL/uL — ABNORMAL HIGH (ref 3.87–5.11)
RDW: 14.8 % (ref 11.5–15.5)
WBC: 4.6 10*3/uL (ref 4.0–10.5)

## 2013-04-02 LAB — LIPID PANEL
Cholesterol: 369 mg/dL — ABNORMAL HIGH (ref 0–200)
HDL: 48 mg/dL (ref >39)
LDL Cholesterol: 294 mg/dL — ABNORMAL HIGH (ref 0–99)
Total CHOL/HDL Ratio: 7.7 Ratio
Triglycerides: 133 mg/dL (ref <150)
VLDL: 27 mg/dL (ref 0–40)

## 2013-04-02 LAB — COMPLETE METABOLIC PANEL WITH GFR
ALT: 13 U/L (ref 0–35)
AST: 18 U/L (ref 0–37)
Albumin: 4.4 g/dL (ref 3.5–5.2)
Alkaline Phosphatase: 85 U/L (ref 39–117)
BUN: 19 mg/dL (ref 6–23)
CO2: 28 mEq/L (ref 19–32)
Calcium: 9.9 mg/dL (ref 8.4–10.5)
Chloride: 103 mEq/L (ref 96–112)
Creat: 0.81 mg/dL (ref 0.50–1.10)
GFR, Est African American: 86 mL/min
GFR, Est Non African American: 74 mL/min
Glucose, Bld: 92 mg/dL (ref 70–99)
Potassium: 5 mEq/L (ref 3.5–5.3)
Sodium: 139 mEq/L (ref 135–145)
Total Bilirubin: 0.6 mg/dL (ref 0.3–1.2)
Total Protein: 7 g/dL (ref 6.0–8.3)

## 2013-04-02 MED ORDER — TRAMADOL HCL 50 MG PO TABS: 50.0000 mg | ORAL_TABLET | Freq: Four times a day (QID) | ORAL | Status: DC | PRN

## 2013-04-02 MED ORDER — RANITIDINE HCL 150 MG PO TABS: 150.0000 mg | ORAL_TABLET | Freq: Two times a day (BID) | ORAL | Status: DC

## 2013-04-02 MED ORDER — LOSARTAN POTASSIUM-HCTZ 50-12.5 MG PO TABS: 1.0000 | ORAL_TABLET | Freq: Every day | ORAL | Status: DC

## 2013-04-02 MED ORDER — SUCRALFATE 1 G PO TABS: 1.0000 g | ORAL_TABLET | Freq: Four times a day (QID) | ORAL | Status: DC

## 2013-04-02 MED ORDER — DEXLANSOPRAZOLE 60 MG PO CPDR: 60.0000 mg | DELAYED_RELEASE_CAPSULE | Freq: Every day | ORAL | Status: DC

## 2013-04-02 NOTE — Patient Instructions (Addendum)
1)  GERD - We are checking you for the bacteria that can cause ulcers etc in your stomach (H. Pylori).  I would recommend an EGD with Dr. Russella Dar to make sure you don't have an ulcer vs Barrett's esophagus.  In the meantime, try some Dexilant in place of Nexium and if that doesn't work then add it to the Nexium. You can also add the following:  Ranitidine 150 mg twice a day; 1 teaspoon of baking soda mixed in 8 oz of water 2 times per day and also add some Mylanta 2-3 times per day.   You may also try some Carafate for your stomach pain.     2)  BP - Take 1/2 of the Benicar till gone then switch over to the prescription I gave you.    3)  Weight - Take 1 -2 of the appetite suppressants daily.  Whenever your have recovered from your surgery then you can do the HCG diet.   Check in the junk box of your husband's email to see if the manuscript is there.  If not then here is the link:   RomanticInfo.[email or web address]   Diet for Gastroesophageal Reflux Disease, Adult Reflux (acid reflux) is when acid from your stomach flows up into the esophagus. When acid comes in contact with the esophagus, the acid causes irritation and soreness (inflammation) in the esophagus. When reflux happens often or so severely that it causes damage to the esophagus, it is called gastroesophageal reflux disease (GERD). Nutrition therapy can help ease the discomfort of GERD. FOODS OR DRINKS TO AVOID OR LIMIT  Smoking or chewing tobacco. Nicotine is one of the most potent stimulants to acid production in the gastrointestinal tract.  Caffeinated and decaffeinated coffee and black tea.  Regular or low-calorie carbonated beverages or energy drinks (caffeine-free carbonated beverages are allowed).   Strong spices, such as black pepper, white pepper, red pepper, cayenne, curry powder, and chili powder.  Peppermint or spearmint.  Chocolate.  High-fat foods, including meats and fried  foods. Extra added fats including oils, butter, salad dressings, and nuts. Limit these to less than 8 tsp per day.  Fruits and vegetables if they are not tolerated, such as citrus fruits or tomatoes.  Alcohol.  Any food that seems to aggravate your condition. If you have questions regarding your diet, call your caregiver or a registered dietitian. OTHER THINGS THAT MAY HELP GERD INCLUDE:   Eating your meals slowly, in a relaxed setting.  Eating 5 to 6 small meals per day instead of 3 large meals.  Eliminating food for a period of time if it causes distress.  Not lying down until 3 hours after eating a meal.  Keeping the head of your bed raised 6 to 9 inches (15 to 23 cm) by using a foam wedge or blocks under the legs of the bed. Lying flat may make symptoms worse.  Being physically active. Weight loss may be helpful in reducing reflux in overweight or obese adults.  Wear loose fitting clothing EXAMPLE MEAL PLAN This meal plan is approximately 2,000 calories based on https://www.bernard.org/ meal planning guidelines. Breakfast   cup cooked oatmeal.  1 cup strawberries.  1 cup low-fat milk.  1 oz almonds. Snack  1 cup cucumber slices.  6 oz yogurt (made from low-fat or fat-free milk). Lunch  2 slice whole-wheat bread.  2 oz sliced [place].  2 tsp mayonnaise.  1 cup blueberries.  1 cup snap peas. Snack  6 whole-wheat crackers.  1 oz  string cheese. Dinner   cup brown rice.  1 cup mixed veggies.  1 tsp olive oil.  3 oz grilled fish. Document Released: 06/21/2005 Document Revised: 09/13/2011 Document Reviewed: 05/07/2011 [facility] Patient Information 2014 [place], [place].  Helicobacter Pylori and Ulcer Disease An ulcer may be in your stomach (gastric ulcer) or in the first part of your small bowel, which is called the duodenum (duodenal ulcer). An ulcer is a break in the stomach or duodenum lining. The break wears down into the deeper tissue. Helicobacter  pylori (H. pylori) is a type of germ (bacteria) that may cause the majority of gastric or duodenal ulcers. CAUSES   A germ (bacterium). H. pylori can weaken the protective mucous coating of the stomach and duodenum. This allows acid to get through to the sensitive lining of the stomach or duodenum and an ulcer can then form.  Certain medications.  Using substances that can bother the lining of the stomach (alcohol, tobacco or medications such as Advil or Motrin) in the presence of H.pylori infection. This can increase the chances of getting an ulcer.  Cancer (rarely). Most people infected with H. pylori do not get ulcers. It is not known how people catch H. pylori. It may be through food or water. H. pylori has been found in the saliva of some infected people. Therefore, the bacteria may also spread through mouth-to-mouth contact such as kissing. SYMPTOMS  The problems (symptoms) of ulcer disease are usually:  A burning or gnawing of the mid-upper belly (abdomen). This is often worse on an empty stomach. It may get better with food. This may be associated with feeling sick to your stomach (nausea), bloating and vomiting.  If the ulcer results in bleeding, it can cause:  Black, tarry stools.  Throwing up bright red blood.  Throwing up coffee ground looking materials. With severe bleeding, there may be loss of consciousness and shock. Besides ulcer disease, H. pylori can also cause chronic gastritis (irritation of the lining of the stomach without ulcer) or stomach acid-type discomfort. You may not have symptoms even though you have an H. pylori infection. Although this is an infection, you may not have usual infection symptoms (such as fever). DIAGNOSIS  Ulcer disease can be diagnosed in many different ways. If you have an ulcer, it is important to know whether or not it is caused by H. Pylori. Treatment for an ulcer caused by H. pylori is different from that for an ulcer with other causes.  The best way to detect H. pylori is taking tissue directly from the ulcer during an endoscopy test.   An endoscopy is an exam that uses an endoscope. This is a thin, lighted tube with a small camera on the end. It is like a flexible telescope. The patient is given a drug to make them calm (sedative). The caregiver eases the endoscope into the mouth and down the throat to the stomach and duodenum. This allows the doctor to see the lining of the esophagus, stomach and duodenum.  If an endoscopy is not needed, then H. pylori can be detected with tests of the blood, stool or even breath. TREATMENT   H. pylori peptic ulcer treatment usually involves a combination of:  Medicines that kill germs (antibiotics).  Acid suppressors.  Stomach protectors.  The use of only one medication to treat H. pylori is not recommended. The most proven treatment is a 2 week course of treatment called triple therapy. It involves taking two antibiotics to kill the bacteria  and either an acid suppressor or stomach-lining shield. Two-week triple therapy reduces ulcer symptoms, kills the bacteria, and prevents ulcers from coming back in many patients.  Unfortunately, patients may find triple therapy hard to do. This is because it involves taking as many as 20 pills a day. Also, the antibiotics used in triple therapy may cause mild side effects. These include nausea, vomiting, diarrhea, dark stools, a metallic taste in the mouth, dizziness, headache and yeast infections in women. Talk to your caregiver if you have any of these side effects. HOME CARE INSTRUCTIONS   Take your medications as directed and for as long as prescribed. Contact your caregiver if you have problems or side effects from your medications.  Continue regular work and usual activities unless told otherwise by your caregiver.  Avoid tobacco, alcohol and caffeine. Tobacco use will decrease and slow healing.  Avoid medications that are harmful. This  includes aspirin and NSAIDS such as ibuprofen and naproxen.  Avoid foods that seem to aggravate or cause discomfort.  There are many over-the-counter products available to control stomach acid and other symptoms. Discuss these with your caregiver before using them. Do not  stop taking prescription medications for over-the-counter medications without talking with your caregiver.  Special diets are not usually needed.  Keep any follow-up appointments and blood tests as directed. SEEK MEDICAL CARE IF:   Your pain or other ulcer symptoms do not improve within a few days of starting treatment.  You develop diarrhea. This can be a problem related to certain treatments.  You have ongoing indigestion or heartburn even if your main ulcer symptoms are improved.  You think you have any side effects from your medications or if you do not understand how to use your medications right. SEEK IMMEDIATE MEDICAL CARE IF:  Any of the following happen:  You develop bright red, rectal bleeding.  You develop dark black, tarry stools.  You throw up (vomit) blood.  You become light-headed, weak, have fainting episodes, or become sweaty, cold and clammy.  You have severe abdominal pain not controlled by medications. Do not take pain medications unless ordered by your caregiver. MAKE SURE YOU:   Understand these instructions.  Will watch your condition.  Will get help right away if you are not doing well or get worse. Document Released: 09/11/2003 Document Revised: 09/13/2011 Document Reviewed: 02/08/2008 [facility] Patient Information 2013 [place], [place].

## 2013-04-02 NOTE — Progress Notes (Signed)
Subjective:    Patient ID: Kaitlin Watkins, female    DOB: May 29, 1946, 67 y.o.   MRN: [id number]  HPI  Kaitlin Watkins is here today to follow up on the conditions listed below:   1)  Knee Pain:  Kaitlin Watkins is still struggling with this problem.  Kaitlin Watkins needs a refill of her tramadol 50 mg. Kaitlin Watkins has an appointment to see her orthopedist Dr. Charlann Boxer. Kaitlin Watkins needs a work note.    2)  Weight:  Kaitlin Watkins does not feel that this is a good time to start her weight loss problem.    3)  GERD:  Kaitlin Watkins has been having increased trouble with her GERD.     Review of Systems  Constitutional: Negative.   HENT: Negative.   Eyes: Negative.   Respiratory: Negative.   Cardiovascular: Negative.   Gastrointestinal: Positive for abdominal pain (GERD ).  Endocrine: Negative.   Genitourinary: Negative.   Musculoskeletal:       Right knee pain.    Skin: Negative.   Allergic/Immunologic: Negative.   Neurological: Negative.   Hematological: Negative.   Psychiatric/Behavioral: Negative.     Past Medical History  Diagnosis Date  . GERD (gastroesophageal reflux disease)   . Hyperlipidemia   . DVT (deep venous thrombosis) 2007    Left Leg  . Hypertension   . Knee pain, left anterior   . Tear of medial meniscus of right knee   . Arthritis   . PONV (postoperative nausea and vomiting)     Family History  Problem Relation Age of Onset  . Heart disease Mother     CHF  . Stroke Mother 81  . Cancer Mother 74    Cerivcal Cancer  . Stroke Father 78  . Alcohol abuse Father   . Heart disease Sister 55    MI  . Heart disease Brother   . Stroke Brother   . Heart disease Brother 50    MI  . Cancer Brother 58    Prostate    History   Social History Narrative   Marital Status: Married Brewing technologist)    Children:  Daughter Britta Mccreedy) Lives in [place]    Pets: None   Living Situation: Lives with husband   Occupation: Teacher, English as a foreign language Engineer, mining)   Education: Engineer, agricultural    Tobacco Use/Exposure:  Former Smoker (Kaitlin Watkins  started smoking at the age of 60 and quit at the age of 51).     Alcohol Use:  3-4 drinks / week   Drug Use:  None   Diet:  Regular   Exercise:  None   Hobbies: Motorcycle Riding       Objective:   Physical Exam  Vitals reviewed. Constitutional: Kaitlin Watkins is oriented to person, place, and time. Kaitlin Watkins appears well-developed and well-nourished. No distress.  HENT:  Head: Normocephalic.  Eyes: No scleral icterus.  Neck: No thyromegaly present.  Cardiovascular: Normal rate, regular rhythm and normal heart sounds.   Pulmonary/Chest: Effort normal and breath sounds normal.  Abdominal: There is no tenderness.  Musculoskeletal: Kaitlin Watkins exhibits no edema and no tenderness.  Neurological: Kaitlin Watkins is alert and oriented to person, place, and time.  Skin: Skin is warm and dry.  Psychiatric: Kaitlin Watkins has a normal mood and affect. Her behavior is normal. Judgment and thought content normal.      Assessment & Plan:    Kaitlin Watkins was seen today to discuss several medical issues:    Diagnoses and associated orders for this visit:  GERD (gastroesophageal reflux disease) Kaitlin Watkins was  given several medications to try for her worsening GERD symptoms.   - H. pylori antibody, IgG - ranitidine (ZANTAC) 150 MG tablet; Take 1 tablet (150 mg total) by mouth 2 (two) times daily. - dexlansoprazole (DEXILANT) 60 MG capsule; Take 1 capsule (60 mg total) by mouth daily. - sucralfate (CARAFATE) 1 G tablet; Take 1 tablet (1 g total) by mouth 4 (four) times daily.  Other malaise and fatigue - CBC w/Diff - TSH  Unspecified vitamin D deficiency  Homocysteinemia - Homocysteine  Other and unspecified hyperlipidemia - COMPLETE METABOLIC PANEL WITH GFR - Lipid panel  Knee pain, acute, left - traMADol (ULTRAM) 50 MG tablet; Take 1 tablet (50 mg total) by mouth every 6 (six) hours as needed for pain.  Essential hypertension, benign -    losartan-hydrochlorothiazide (HYZAAR) 50-12.5 MG per tablet; Take 1 tablet by mouth  daily.

## 2013-04-03 LAB — H. PYLORI ANTIBODY, IGG: H Pylori IgG: 0.73 {ISR}

## 2013-04-03 LAB — TSH: TSH: 1.105 u[IU]/mL (ref 0.350–4.500)

## 2013-04-03 LAB — HOMOCYSTEINE: Homocysteine: 11.8 umol/L (ref 4.0–15.4)

## 2013-04-04 ENCOUNTER — Ambulatory Visit (INDEPENDENT_AMBULATORY_CARE_PROVIDER_SITE_OTHER): Payer: Medicare Other | Admitting: Gastroenterology

## 2013-04-04 ENCOUNTER — Encounter: Payer: Self-pay | Admitting: Gastroenterology

## 2013-04-04 VITALS — BP 100/62 | HR 80 | Ht 65.0 in | Wt 202.0 lb

## 2013-04-04 DIAGNOSIS — K5900 Constipation, unspecified: Secondary | ICD-10-CM

## 2013-04-04 DIAGNOSIS — R1013 Epigastric pain: Secondary | ICD-10-CM

## 2013-04-04 NOTE — Patient Instructions (Addendum)
Please start a daily fiber supplement and Milk of Magnesia as needed for constipation as needed.  Stop taking Carafate.  You have been scheduled for an endoscopy with propofol. Please follow written instructions given to you at your visit today. If you use inhalers (even only as needed), please bring them with you on the day of your procedure. Your physician has requested that you go to www.startemmi.com and enter the access code given to you at your visit today. This web site gives a general overview about your procedure. However, you should still follow specific instructions given to you by our office regarding your preparation for the procedure.  Thank you for choosing me and [facility].  Venita Lick. Pleas Koch., MD., Clementeen Graham  cc: Birdena Jubilee, MD

## 2013-04-04 NOTE — Progress Notes (Signed)
History of Present Illness: This is a 67 year old female with a history of chronic epigastric pain that has worsened over the past 2 weeks. She states she has had epigastric pain for 40 years. Pain improved with meals. Chronically on Nexium. Changed from Nexium to Dexilant 2 days ago with no help. Chronic constipation has worsened. Notes black stools for 2 weeks. Denies Fe or Peptobismol use. Injured her knee 2 weeks ago and she has been less mobile. She was started on tramadol for knee pain. CBC, CMET, TSH, H. pylori Ab all normal this week. Denies weight loss, diarrhea, change in stool caliber, melena, hematochezia, nausea, vomiting, dysphagia, reflux symptoms, chest pain.  Review of Systems: Pertinent positive and negative review of systems were noted in the above HPI section. All other review of systems were otherwise negative.  Current Medications, Allergies, Past Medical History, Past Surgical History, Family History and Social History were reviewed in [employer] record.  Physical Exam: General: Well developed , well nourished, in a wheelchair, no acute distress Head: Normocephalic and atraumatic Eyes:  sclerae anicteric, EOMI Ears: Normal auditory acuity Mouth: No deformity or lesions Neck: Supple, no masses or thyromegaly Lungs: Clear throughout to auscultation Heart: Regular rate and rhythm; no murmurs, rubs or bruits Abdomen: Soft, non tender and non distended. No masses, hepatosplenomegaly or hernias noted. Normal Bowel sounds Rectal: dark heme negative stool, external hemorrhoids, no other lesions Musculoskeletal: Symmetrical with no gross deformities  Skin: No lesions on visible extremities Pulses:  Normal pulses noted Extremities: No clubbing, cyanosis, edema or deformities noted Neurological: Alert oriented x 4, grossly nonfocal Cervical Nodes:  No significant cervical adenopathy Inguinal Nodes: No significant inguinal adenopathy Psychological:  Alert  and cooperative. Normal mood and affect  Assessment and Recommendations:  1. Chronic epigastric pain, worsening over the past 2 weeks. No tenderness on exam. Dark stool that is heme negative. R/O ulcer, gastritis, neoplasm. Schedule EGD and if it is not diagnostic proceed with CT. The risks, benefits, and alternatives to endoscopy with possible biopsy and possible dilation were discussed with the patient and they consent to proceed.   2. Chronic constipation, worsening. Add a daily fiber supplement, increased daily water intake and take MOM daily as needed.  3. Colorectal cancer screening, average risk. 10 year screening colonoscopy is due in 08/2018.

## 2013-04-05 ENCOUNTER — Encounter: Payer: Self-pay | Admitting: Gastroenterology

## 2013-04-05 ENCOUNTER — Ambulatory Visit (AMBULATORY_SURGERY_CENTER): Payer: Medicare Other | Admitting: Gastroenterology

## 2013-04-05 VITALS — BP 125/79 | HR 81 | Temp 97.5°F | Resp 18 | Ht 65.0 in | Wt 202.0 lb

## 2013-04-05 DIAGNOSIS — R1013 Epigastric pain: Secondary | ICD-10-CM

## 2013-04-05 DIAGNOSIS — K2960 Other gastritis without bleeding: Secondary | ICD-10-CM

## 2013-04-05 DIAGNOSIS — K2970 Gastritis, unspecified, without bleeding: Secondary | ICD-10-CM

## 2013-04-05 MED ORDER — SODIUM CHLORIDE 0.9 % IV SOLN: 500.0000 mL | INTRAVENOUS | Status: DC

## 2013-04-05 NOTE — Progress Notes (Signed)
Patient did not experience any of the following events: a burn prior to discharge; a fall within the facility; wrong site/side/patient/procedure/implant event; or a hospital transfer or hospital admission upon discharge from the facility. (G8907) Patient did not have preoperative order for IV antibiotic SSI prophylaxis. (G8918)  

## 2013-04-05 NOTE — Patient Instructions (Signed)
YOU HAD AN ENDOSCOPIC PROCEDURE TODAY AT THE [facility]: Refer to the procedure report that was given to you for any specific questions about what was found during the examination.  If the procedure report does not answer your questions, please call your gastroenterologist to clarify.  If you requested that your care partner not be given the details of your procedure findings, then the procedure report has been included in a sealed envelope for you to review at your convenience later.  YOU SHOULD EXPECT: Some feelings of bloating in the abdomen. Passage of more gas than usual.  Walking can help get rid of the air that was put into your GI tract during the procedure and reduce the bloating. If you had a lower endoscopy (such as a colonoscopy or flexible sigmoidoscopy) you may notice spotting of blood in your stool or on the toilet paper. If you underwent a bowel prep for your procedure, then you may not have a normal bowel movement for a few days.  DIET: Your first meal following the procedure should be a light meal and then it is ok to progress to your normal diet.  A half-sandwich or bowl of soup is an example of a good first meal.  Heavy or fried foods are harder to digest and may make you feel nauseous or bloated.  Likewise meals heavy in dairy and vegetables can cause extra gas to form and this can also increase the bloating.  Drink plenty of fluids but you should avoid alcoholic beverages for 24 hours.  ACTIVITY: Your care partner should take you home directly after the procedure.  You should plan to take it easy, moving slowly for the rest of the day.  You can resume normal activity the day after the procedure however you should NOT DRIVE or use heavy machinery for 24 hours (because of the sedation medicines used during the test).    SYMPTOMS TO REPORT IMMEDIATELY: A gastroenterologist can be reached at any hour.  During normal business hours, 8:30 AM to 5:00 PM Monday through Friday,  call [phone].  After hours and on weekends, please call the GI answering service at [phone] who will take a message and have the physician on call contact you.     Following upper endoscopy (EGD)  Vomiting of blood or coffee ground material  New chest pain or pain under the shoulder blades  Painful or persistently difficult swallowing  New shortness of breath  Fever of 100F or higher  Black, tarry-looking stools  FOLLOW UP: If any biopsies were taken you will be contacted by phone or by letter within the next 1-3 weeks.  Call your gastroenterologist if you have not heard about the biopsies in 3 weeks.  Our staff will call the home number listed on your records the next business day following your procedure to check on you and address any questions or concerns that you may have at that time regarding the information given to you following your procedure. This is a courtesy call and so if there is no answer at the home number and we have not heard from you through the emergency physician on call, we will assume that you have returned to your regular daily activities without incident.  SIGNATURES/CONFIDENTIALITY: You and/or your care partner have signed paperwork which will be entered into your electronic medical record.  These signatures attest to the fact that that the information above on your After Visit Summary has been reviewed and is understood.    Full responsibility of the confidentiality of this discharge information lies with you and/or your care-partner.   Information on gastritis given to you today  Dr. Ardell Isaacs office nurse will call you to give you time & date & details of CT scan he is ordering for you to have done. Contrast was given to you today and that nurse will give you instructions on how to use this whrn she calls you.

## 2013-04-05 NOTE — Op Note (Signed)
[facility] [address] [place], [address]   ENDOSCOPY PROCEDURE REPORT  PATIENT: Kaitlin Watkins, Kaitlin Watkins  MR#: [id number] BIRTHDATE: 1946/05/05 , 67  yrs. old GENDER: Female ENDOSCOPIST: Meryl Dare, MD, Clementeen Graham REFERRED BY:  Birdena Jubilee, M.D. PROCEDURE DATE:  04/05/2013 PROCEDURE:  EGD, diagnostic ASA CLASS:     Class II INDICATIONS:  Epigastric pain. MEDICATIONS: MAC sedation, administered by CRNA and propofol (Diprivan) 60mg  IV TOPICAL ANESTHETIC: Cetacaine Spray DESCRIPTION OF PROCEDURE: After the risks benefits and alternatives of the procedure were thoroughly explained, informed consent was obtained.  The LB [id number] endoscope was introduced through the mouth and advanced to the second portion of the duodenum  without limitations.  The instrument was slowly withdrawn as the mucosa was fully examined.  STOMACH: Mild gastritis (inflammation) was found in the gastric antrum and gastric body.  Multiple biopsies were performed.   The stomach otherwise appeared normal. ESOPHAGUS: The mucosa of the esophagus appeared normal. DUODENUM: The duodenal mucosa showed no abnormalities in the bulb and second portion of the duodenum.  Retroflexed views revealed no abnormalities.   The scope was then withdrawn from the patient and the procedure completed.  COMPLICATIONS: There were no complications.  ENDOSCOPIC IMPRESSION: 1.   Mild gastritis in the gastric antrum and gastric body; multiple biopsies 2.   The EGD otherwise appeared normal  RECOMMENDATIONS: 1.  Await pathology results 2.  No findings to explain epigastric pain 2.  My office will arrange for you to have a CT scan of your abdomen and pelvis.   eSigned:  Meryl Dare, MD, [facility] 04/05/2013 8:21 AM

## 2013-04-05 NOTE — Progress Notes (Signed)
Lidocaine-40mg IV prior to Propofol InductionPropofol given over incremental dosages 

## 2013-04-05 NOTE — Progress Notes (Signed)
Called to room to assist during endoscopic procedure.  Patient ID and intended procedure confirmed with present staff. Received instructions for my participation in the procedure from the performing physician.  

## 2013-04-06 ENCOUNTER — Telehealth: Payer: Self-pay

## 2013-04-06 ENCOUNTER — Encounter: Payer: Self-pay | Admitting: Family Medicine

## 2013-04-06 ENCOUNTER — Ambulatory Visit (INDEPENDENT_AMBULATORY_CARE_PROVIDER_SITE_OTHER): Payer: Medicare Other | Admitting: Family Medicine

## 2013-04-06 VITALS — BP 121/83 | HR 69 | Resp 16 | Wt 207.0 lb

## 2013-04-06 DIAGNOSIS — S83241S Other tear of medial meniscus, current injury, right knee, sequela: Secondary | ICD-10-CM

## 2013-04-06 DIAGNOSIS — E785 Hyperlipidemia, unspecified: Secondary | ICD-10-CM

## 2013-04-06 DIAGNOSIS — R109 Unspecified abdominal pain: Secondary | ICD-10-CM

## 2013-04-06 DIAGNOSIS — K219 Gastro-esophageal reflux disease without esophagitis: Secondary | ICD-10-CM

## 2013-04-06 DIAGNOSIS — IMO0002 Reserved for concepts with insufficient information to code with codable children: Secondary | ICD-10-CM

## 2013-04-06 MED ORDER — ATORVASTATIN CALCIUM 80 MG PO TABS: 80.0000 mg | ORAL_TABLET | Freq: Every day | ORAL | Status: DC

## 2013-04-06 NOTE — Progress Notes (Signed)
Subjective:    Patient ID: Kaitlin Watkins, female    DOB: 04-25-1946, 67 y.o.   MRN: [id number]  HPI  Kaitlin Watkins is here today to discuss her most recent lab results and to get her surgical clearance form completed.     1)  Knee Pain:  She continues to have severe pain in her right knee and is going to have surgery with Dr. Shelle Iron with [facility].  She needs her surgical clearance form completed.   2)  GERD:  She has seen Dr. Russella Dar since her last visit and has had an EGD.  She was told that she does not have an ulcer but her stomach lining is irritated.  Dr. Melene Muller did biopsies.  She thinks that he wants her to gave an abdominal CT.    3)  Hyperlipidemia:  She has had high cholesterol for a long time.  She used to take Baycol many years ago.        Review of Systems  Constitutional: Negative.   HENT: Negative.   Eyes: Negative.   Respiratory: Negative.   Cardiovascular: Negative.   Gastrointestinal:       Acid reflux  Endocrine: Negative.   Genitourinary: Negative.   Musculoskeletal: Positive for arthralgias.       Right knee pain  Allergic/Immunologic: Negative.   Neurological: Negative.   Hematological: Negative.   Psychiatric/Behavioral: Negative.     Past Medical History  Diagnosis Date  . GERD (gastroesophageal reflux disease)   . Hyperlipidemia   . DVT (deep venous thrombosis) 1950    Left Leg  . Hypertension   . Knee pain, left anterior   . Tear of medial meniscus of right knee     Past Surgical History  Procedure Laterality Date  . Knee arthroscopy Left 1989  . Tonsillectomy      Third Grade   . Appendectomy  1975  . Carpal tunnel release Left 2009  . Cystostomy w/ bladder biopsy    . Refractive surgery Bilateral 2004  . Vaginal hysterectomy  1975    Pain  . Bilateral salpingectomy      Family History  Problem Relation Age of Onset  . Heart disease Mother     CHF  . Stroke Mother 19  . Cancer Mother 94    Cerivcal Cancer  . Stroke  Father 26  . Alcohol abuse Father   . Heart disease Sister 74    MI  . Heart disease Brother   . Stroke Brother   . Heart disease Brother 50    MI  . Cancer Brother 78    Prostate    History   Social History Narrative   Marital Status: Married Brewing technologist)    Children:  Daughter Britta Mccreedy) Lives in [place]    Pets: None   Living Situation: Lives with husband   Occupation: Teacher, English as a foreign language Engineer, mining)   Education: Engineer, agricultural    Tobacco Use/Exposure:  Former Smoker (She started smoking at the age of 72 and quit at the age of 33).     Alcohol Use:  3-4 drinks / week   Drug Use:  None   Diet:  Regular   Exercise:  None   Hobbies: Motorcycle Riding       Objective:   Physical Exam  Vitals reviewed. Constitutional: She is oriented to person, place, and time.  Eyes: Conjunctivae are normal. No scleral icterus.  Neck: Neck supple. No thyromegaly present.  Cardiovascular: Normal rate, regular rhythm and normal heart  sounds.   Pulmonary/Chest: Effort normal and breath sounds normal.  Musculoskeletal: She exhibits no edema and no tenderness.  Lymphadenopathy:    She has no cervical adenopathy.  Neurological: She is alert and oriented to person, place, and time.  Skin: Skin is warm and dry.  Psychiatric: She has a normal mood and affect. Her behavior is normal. Judgment and thought content normal.     Assessment & Plan:

## 2013-04-06 NOTE — Telephone Encounter (Signed)
  Follow up Call-  Call back number 04/05/2013  Post procedure Call Back phone  # [phone]  Permission to leave phone message Yes     Patient questions:  Do you have a fever, pain , or abdominal swelling? no Pain Score  0 *  Have you tolerated food without any problems? yes  Have you been able to return to your normal activities? yes  Do you have any questions about your discharge instructions: Diet   no Medications  no Follow up visit  no  Do you have questions or concerns about your Care? no  Actions: * If pain score is 4 or above: No action needed, pain <4.

## 2013-04-06 NOTE — Telephone Encounter (Signed)
Patient advised of CT scan abd/pelvis scheduled for 04/09/13 3:00.  She verbalized understanding of all written and verbal instructions

## 2013-04-06 NOTE — Patient Instructions (Addendum)
1)  High Cholesterol - Take 1 Lipitor 80 mg daily.  Fat and Cholesterol Control Diet Cholesterol levels in your body are determined significantly by your diet. Cholesterol levels may also be related to heart disease. The following material helps to explain this relationship and discusses what you can do to help keep your heart healthy. Not all cholesterol is bad. Low-density lipoprotein (LDL) cholesterol is the "bad" cholesterol. It may cause fatty deposits to build up inside your arteries. High-density lipoprotein (HDL) cholesterol is "good." It helps to remove the "bad" LDL cholesterol from your blood. Cholesterol is a very important risk factor for heart disease. Other risk factors are high blood pressure, smoking, stress, heredity, and weight. The heart muscle gets its supply of blood through the coronary arteries. If your LDL cholesterol is high and your HDL cholesterol is low, you are at risk for having fatty deposits build up in your coronary arteries. This leaves less room through which blood can flow. Without sufficient blood and oxygen, the heart muscle cannot function properly and you may feel chest pains (angina pectoris). When a coronary artery closes up entirely, a part of the heart muscle may die causing a heart attack (myocardial infarction). CHECKING CHOLESTEROL When your caregiver sends your blood to a lab to be examined for cholesterol, a complete lipid (fat) profile may be done. With this test, the total amount of cholesterol and levels of LDL and HDL are determined. Triglycerides are a type of fat that circulates in the blood. They can also be used to determine heart disease risk. The list below describes what the numbers should be: Test: Total Cholesterol.  Less than 200 mg/dl. Test: LDL "bad cholesterol."  Less than 100 mg/dl.  Less than 70 mg/dl if you are at very high risk of a heart attack or sudden cardiac death. Test: HDL "good cholesterol."  Greater than 50 mg/dl for  women.  Greater than 40 mg/dl for men. Test: Triglycerides.  Less than 150 mg/dl. CONTROLLING CHOLESTEROL WITH DIET Although exercise and lifestyle factors are important, your diet is key. That is because certain foods are known to raise cholesterol and others to lower it. The goal is to balance foods for their effect on cholesterol and more importantly, to replace saturated and trans fat with other types of fat, such as monounsaturated fat, polyunsaturated fat, and omega-3 fatty acids. On average, a person should consume no more than 15 to 17 g of saturated fat daily. Saturated and trans fats are considered "bad" fats, and they will raise LDL cholesterol. Saturated fats are primarily found in animal products such as meats, butter, and cream. However, that does not mean you need to give up all your favorite foods. Today, there are good tasting, low-fat, low-cholesterol substitutes for most of the things you like to eat. Choose low-fat or nonfat alternatives. Choose round or loin cuts of red meat. These types of cuts are lowest in fat and cholesterol. Chicken (without the skin), fish, veal, and ground [place] breast are great choices. Eliminate fatty meats, such as hot dogs and salami. Even shellfish have little or no saturated fat. Have a 3 oz (85 g) portion when you eat lean meat, poultry, or fish. Trans fats are also called "partially hydrogenated oils." They are oils that have been scientifically manipulated so that they are solid at room temperature resulting in a longer shelf life and improved taste and texture of foods in which they are added. Trans fats are found in stick margarine, some tub  margarines, cookies, crackers, and baked goods.  When baking and cooking, oils are a great substitute for butter. The monounsaturated oils are especially beneficial since it is believed they lower LDL and raise HDL. The oils you should avoid entirely are saturated tropical oils, such as coconut and palm.   Remember to eat a lot from food groups that are naturally free of saturated and trans fat, including fish, fruit, vegetables, beans, grains (barley, rice, couscous, bulgur wheat), and pasta (without cream sauces).  IDENTIFYING FOODS THAT LOWER CHOLESTEROL  Soluble fiber may lower your cholesterol. This type of fiber is found in fruits such as apples, vegetables such as broccoli, potatoes, and carrots, legumes such as beans, peas, and lentils, and grains such as barley. Foods fortified with plant sterols (phytosterol) may also lower cholesterol. You should eat at least 2 g per day of these foods for a cholesterol lowering effect.  Read package labels to identify low-saturated fats, trans fat free, and low-fat foods at the supermarket. Select cheeses that have only 2 to 3 g saturated fat per ounce. Use a heart-healthy tub margarine that is free of trans fats or partially hydrogenated oil. When buying baked goods (cookies, crackers), avoid partially hydrogenated oils. Breads and muffins should be made from whole grains (whole-wheat or whole oat flour, instead of "flour" or "enriched flour"). Buy non-creamy canned soups with reduced salt and no added fats.  FOOD PREPARATION TECHNIQUES  Never deep-fry. If you must fry, either stir-fry, which uses very little fat, or use non-stick cooking sprays. When possible, broil, bake, or roast meats, and steam vegetables. Instead of putting butter or margarine on vegetables, use lemon and herbs, applesauce, and cinnamon (for squash and sweet potatoes), nonfat yogurt, salsa, and low-fat dressings for salads.  LOW-SATURATED FAT / LOW-FAT FOOD SUBSTITUTES Meats / Saturated Fat (g)  Avoid: Steak, marbled (3 oz/85 g) / 11 g  Choose: Steak, lean (3 oz/85 g) / 4 g  Avoid: Hamburger (3 oz/85 g) / 7 g  Choose: Hamburger, lean (3 oz/85 g) / 5 g  Avoid: Ham (3 oz/85 g) / 6 g  Choose: Ham, lean cut (3 oz/85 g) / 2.4 g  Avoid: Chicken, with skin, dark meat (3 oz/85 g) / 4  g  Choose: Chicken, skin removed, dark meat (3 oz/85 g) / 2 g  Avoid: Chicken, with skin, light meat (3 oz/85 g) / 2.5 g  Choose: Chicken, skin removed, light meat (3 oz/85 g) / 1 g Dairy / Saturated Fat (g)  Avoid: Whole milk (1 cup) / 5 g  Choose: Low-fat milk, 2% (1 cup) / 3 g  Choose: Low-fat milk, 1% (1 cup) / 1.5 g  Choose: Skim milk (1 cup) / 0.3 g  Avoid: Hard cheese (1 oz/28 g) / 6 g  Choose: Skim milk cheese (1 oz/28 g) / 2 to 3 g  Avoid: Cottage cheese, 4% fat (1 cup) / 6.5 g  Choose: Low-fat cottage cheese, 1% fat (1 cup) / 1.5 g  Avoid: Ice cream (1 cup) / 9 g  Choose: Sherbet (1 cup) / 2.5 g  Choose: Nonfat frozen yogurt (1 cup) / 0.3 g  Choose: Frozen fruit bar / trace  Avoid: Whipped cream (1 tbs) / 3.5 g  Choose: Nondairy whipped topping (1 tbs) / 1 g Condiments / Saturated Fat (g)  Avoid: Mayonnaise (1 tbs) / 2 g  Choose: Low-fat mayonnaise (1 tbs) / 1 g  Avoid: Butter (1 tbs) / 7 g  Choose: Extra  light margarine (1 tbs) / 1 g  Avoid: Coconut oil (1 tbs) / 11.8 g  Choose: Olive oil (1 tbs) / 1.8 g  Choose: Corn oil (1 tbs) / 1.7 g  Choose: Safflower oil (1 tbs) / 1.2 g  Choose: Sunflower oil (1 tbs) / 1.4 g  Choose: Soybean oil (1 tbs) / 2.4 g  Choose: Canola oil (1 tbs) / 1 g Document Released: 06/21/2005 Document Revised: 09/13/2011 Document Reviewed: 12/10/2010 Exit[employer] Patient Information 2014 [place], [place].

## 2013-04-07 ENCOUNTER — Encounter: Payer: Self-pay | Admitting: Family Medicine

## 2013-04-07 DIAGNOSIS — E785 Hyperlipidemia, unspecified: Secondary | ICD-10-CM | POA: Insufficient documentation

## 2013-04-07 NOTE — Assessment & Plan Note (Signed)
She was negative for H. Pylori.  She is going to take the Ranitidine along with the Carafate to see if it will help her symptoms.

## 2013-04-07 NOTE — Assessment & Plan Note (Signed)
She is going to proceed with surgery.

## 2013-04-07 NOTE — Assessment & Plan Note (Signed)
She is going to start on Lipitor 80 mg.  F/U in 3 months for a recheck of labs.

## 2013-04-09 ENCOUNTER — Ambulatory Visit (INDEPENDENT_AMBULATORY_CARE_PROVIDER_SITE_OTHER)
Admission: RE | Admit: 2013-04-09 | Discharge: 2013-04-09 | Disposition: A | Discharge status: Home / Self Care | Payer: Medicare Other | Source: Ambulatory Visit | Attending: Gastroenterology | Admitting: Gastroenterology

## 2013-04-09 DIAGNOSIS — R109 Unspecified abdominal pain: Secondary | ICD-10-CM

## 2013-04-09 MED ORDER — IOHEXOL 300 MG/ML  SOLN
100.0000 mL | Freq: Once | INTRAMUSCULAR | Status: AC | PRN
  Administered 2013-04-09: 80 mL via INTRAVENOUS

## 2013-04-10 ENCOUNTER — Telehealth: Payer: Self-pay | Admitting: Gastroenterology

## 2013-04-10 NOTE — Telephone Encounter (Signed)
Patient advised that no acute findings on the CT.  Will call with final results when Dr. Russella Dar has reviewed

## 2013-04-11 ENCOUNTER — Other Ambulatory Visit: Payer: Self-pay | Admitting: Orthopedic Surgery

## 2013-04-11 NOTE — Progress Notes (Signed)
Need orders please - pt coming for preop TUES 04/17/13 - Thank You

## 2013-04-12 ENCOUNTER — Encounter (HOSPITAL_COMMUNITY): Payer: Self-pay | Admitting: Pharmacy Technician

## 2013-04-17 ENCOUNTER — Ambulatory Visit (HOSPITAL_COMMUNITY)
Admission: RE | Admit: 2013-04-17 | Discharge: 2013-04-17 | Disposition: A | Discharge status: Home / Self Care | Payer: Medicare Other | Source: Ambulatory Visit | Attending: Specialist | Admitting: Specialist

## 2013-04-17 ENCOUNTER — Encounter (INDEPENDENT_AMBULATORY_CARE_PROVIDER_SITE_OTHER): Payer: Self-pay

## 2013-04-17 ENCOUNTER — Encounter (HOSPITAL_COMMUNITY)
Admission: RE | Admit: 2013-04-17 | Discharge: 2013-04-17 | Disposition: A | Discharge status: Home / Self Care | Payer: Medicare Other | Source: Ambulatory Visit | Attending: Specialist | Admitting: Specialist

## 2013-04-17 ENCOUNTER — Encounter (HOSPITAL_COMMUNITY): Payer: Self-pay

## 2013-04-17 DIAGNOSIS — M2240 Chondromalacia patellae, unspecified knee: Secondary | ICD-10-CM | POA: Insufficient documentation

## 2013-04-17 DIAGNOSIS — Z01818 Encounter for other preprocedural examination: Secondary | ICD-10-CM | POA: Insufficient documentation

## 2013-04-17 DIAGNOSIS — Z01812 Encounter for preprocedural laboratory examination: Secondary | ICD-10-CM | POA: Insufficient documentation

## 2013-04-17 DIAGNOSIS — IMO0002 Reserved for concepts with insufficient information to code with codable children: Secondary | ICD-10-CM | POA: Insufficient documentation

## 2013-04-17 DIAGNOSIS — X58XXXA Exposure to other specified factors, initial encounter: Secondary | ICD-10-CM | POA: Insufficient documentation

## 2013-04-17 HISTORY — DX: Other specified postprocedural states: Z98.890

## 2013-04-17 HISTORY — DX: Nausea with vomiting, unspecified: R11.2

## 2013-04-17 HISTORY — DX: Unspecified osteoarthritis, unspecified site: M19.90

## 2013-04-17 LAB — CBC
HCT: 42.1 % (ref 36.0–46.0)
MCHC: 34 g/dL (ref 30.0–36.0)
MCV: 85.4 fL (ref 78.0–100.0)
Platelets: 235 10*3/uL (ref 150–400)
RBC: 4.93 MIL/uL (ref 3.87–5.11)
RDW: 13.5 % (ref 11.5–15.5)
WBC: 5.6 10*3/uL (ref 4.0–10.5)

## 2013-04-17 LAB — BASIC METABOLIC PANEL
CO2: 27 mEq/L (ref 19–32)
Calcium: 9.6 mg/dL (ref 8.4–10.5)
Chloride: 102 mEq/L (ref 96–112)
GFR calc Af Amer: 72 mL/min — ABNORMAL LOW (ref >90)
GFR calc non Af Amer: 62 mL/min — ABNORMAL LOW (ref >90)
Potassium: 4.5 mEq/L (ref 3.5–5.1)
Sodium: 138 mEq/L (ref 135–145)

## 2013-04-17 NOTE — Progress Notes (Signed)
Pt states that she needs a blood thinner after surgery to make sure to avoid blood clots.

## 2013-04-17 NOTE — Patient Instructions (Addendum)
20 BRENLYNN FAKE  04/17/2013   Your procedure is scheduled on: 04/19/13  Report to [facility] at 7:30 AM.  Call this number if you have problems the morning of surgery 336-: [phone]   Remember:   Do not eat food or drink liquids After Midnight.     Take these medicines the morning of surgery with A SIP OF WATER: nexium, zantac, dexilant   Do not wear jewelry, make-up or nail polish.  Do not wear lotions, powders, or perfumes. You may wear deodorant.  Do not shave 48 hours prior to surgery. Men may shave face and neck.  Do not bring valuables to the hospital.  Contacts, dentures or bridgework may not be worn into surgery.    Patients discharged the day of surgery will not be allowed to drive home.  Name and phone number of your driver: Maurine Minister (husband) [phone]    Please read over the following fact sheets that you were given: incentive spirometry fact sheet Birdie Sons, RN  pre op nurse call if needed [phone]    FAILURE TO FOLLOW THESE INSTRUCTIONS MAY RESULT IN CANCELLATION OF YOUR SURGERY   Patient Signature: ___________________________________________

## 2013-04-17 NOTE — Progress Notes (Signed)
EKG 03/26/13 on chart

## 2013-04-18 ENCOUNTER — Encounter: Payer: Self-pay | Admitting: Gastroenterology

## 2013-04-18 ENCOUNTER — Other Ambulatory Visit: Payer: Self-pay | Admitting: Orthopedic Surgery

## 2013-04-18 NOTE — H&P (Signed)
Kaitlin Watkins is an 67 y.o. female.   Chief Complaint: right knee pain HPI: The patient is a 67 year old female who presents with knee complaints which began 6 weeks ago without any known injury. The patient describes the severity of the symptoms as severe. The patient feels that the symptoms are unchanging. Prior to being seen today the patient was previously evaluated by a primary care provider. Previous work-up for this problem has included knee x-rays and knee MRI. Past treatment for this problem has included application of ice, restricted activities, knee immobilizer, crutches (partial weight bearing), nonsteroidal anti-inflammatory drugs and non-opioid analgesics (ultram). Symptoms are reported to be located in the right medial knee and include knee pain, swelling, stiffness, decreased range of motion, difficulty bearing weight and difficulty ambulating, while symptoms reported today do not include redness, ecchymosis or locking. The patient does not report any radiation of symptoms. The patient describes their pain as sharp. Symptoms are exacerbated by motion at the knee, weight bearing, walking and squatting. Symptoms are relieved by rest and ice, while symptoms are not relieved by non-opioid analgesics. She denies prior hx of R knee pain, in fact, she has prior hx of L knee injury with post-traumatic arthritis, is s/p prior L knee scope by Dr Charlann Boxer in the past with post-op DVT requiring anticoagulation for a year. She reports her right knee had been doing well when suddenly a month ago she started having pain, swelling, difficulty walking. She has now had to rely more on the left knee which has increased its pain, although she has been c/o pain and giving way in the left for some time now. She does note improvement of the right knee with rest but reports pain with any flexion or weightbearing activity. She denies instability but reports a constant pain. She is very limited in her activity due to this.  She has tried taking NSAIDs and ultram without significant relief.  Past Medical History  Diagnosis Date  . GERD (gastroesophageal reflux disease)   . Hyperlipidemia   . DVT (deep venous thrombosis) 2007    Left Leg  . Hypertension   . Knee pain, left anterior   . Tear of medial meniscus of right knee   . Arthritis   . PONV (postoperative nausea and vomiting)     Past Surgical History  Procedure Laterality Date  . Knee arthroscopy Left 1989  . Appendectomy  1975  . Carpal tunnel release Left 2009  . Cystostomy w/ bladder biopsy    . Refractive surgery Bilateral 2004  . Vaginal hysterectomy  1975    Pain  . Bilateral salpingectomy    . Leg surgery  2007    left leg DVT  . Abdominal hysterectomy  1975  . Skin cancer excision Left     foot  . Breast biopsy Left   . Tonsillectomy      Third Grade     Family History  Problem Relation Age of Onset  . Heart disease Mother     CHF  . Stroke Mother 22  . Cancer Mother 49    Cerivcal Cancer  . Stroke Father 5  . Alcohol abuse Father   . Heart disease Sister 79    MI  . Heart disease Brother   . Stroke Brother   . Heart disease Brother 50    MI  . Cancer Brother 19    Prostate   Social History:  reports that she quit smoking about 48 years ago.  Her smoking use included Cigarettes. She has a 10 pack-year smoking history. She has never used smokeless tobacco. She reports that she drinks alcohol. She reports that she does not use illicit drugs.  Allergies:  Allergies  Allergen Reactions  . Eggs Or Egg-Derived Products     Patient can eat eggs but cannot have a flu shot     (Not in a hospital admission)  Results for orders placed during the hospital encounter of 04/17/13 (from the past 48 hour(s))  CBC     Status: None   Collection Time    04/17/13  2:20 PM      Result Value Range   WBC 5.6  4.0 - 10.5 K/uL   RBC 4.93  3.87 - 5.11 MIL/uL   Hemoglobin 14.3  12.0 - 15.0 g/dL   HCT 16.1  09.6 - 04.5 %   MCV  85.4  78.0 - 100.0 fL   MCH 29.0  26.0 - 34.0 pg   MCHC 34.0  30.0 - 36.0 g/dL   RDW 40.9  81.1 - 91.4 %   Platelets 235  150 - 400 K/uL  BASIC METABOLIC PANEL     Status: Abnormal   Collection Time    04/17/13  2:20 PM      Result Value Range   Sodium 138  135 - 145 mEq/L   Potassium 4.5  3.5 - 5.1 mEq/L   Chloride 102  96 - 112 mEq/L   CO2 27  19 - 32 mEq/L   Glucose, Bld 97  70 - 99 mg/dL   BUN 13  6 - 23 mg/dL   Creatinine, Ser 7.82  0.50 - 1.10 mg/dL   Calcium 9.6  8.4 - 95.6 mg/dL   GFR calc non Af Amer 62 (*) >90 mL/min   GFR calc Af Amer 72 (*) >90 mL/min   Comment: (NOTE)     The eGFR has been calculated using the CKD EPI equation.     This calculation has not been validated in all clinical situations.     eGFR's persistently <90 mL/min signify possible Chronic Kidney     Disease.   Dg Chest 2 View  04/17/2013   CLINICAL DATA:  Preoperative respiratory films.  EXAM: CHEST  2 VIEW  COMPARISON:  None.  FINDINGS: Heart size and mediastinal contours are within normal limits. Both lungs are clear. Visualized skeletal structures are unremarkable.  IMPRESSION: No active cardiopulmonary disease.   Electronically Signed   By: Drusilla Kanner M.D.   On: 04/17/2013 14:43    Review of Systems  Constitutional: Negative.   HENT: Negative.   Eyes: Negative.   Respiratory: Negative.   Cardiovascular: Negative.   Gastrointestinal: Negative.   Genitourinary: Negative.   Musculoskeletal: Positive for joint pain.  Skin: Negative.   Neurological: Negative.   Psychiatric/Behavioral: Negative.     There were no vitals taken for this visit. Physical Exam  Constitutional: She is oriented to person, place, and time. She appears well-developed and well-nourished.  HENT:  Head: Normocephalic and atraumatic.  Eyes: Conjunctivae and EOM are normal. Pupils are equal, round, and reactive to light.  Neck: Normal range of motion. Neck supple.  Cardiovascular: Normal rate and regular  rhythm.   Respiratory: Effort normal and breath sounds normal.  GI: Soft. Bowel sounds are normal.  Musculoskeletal:  Right Knee: Inspection and Palpation:Tenderness- mid 1/3 of the medial joint line tender to palpation and anterior 1/3 of the lateral joint line tender to palpation. no tenderness  to palpation of the superior calf, no tenderness to palpation of the pes anserine bursa, no tenderness to palpation of the quadriceps tendon, no tenderness to palpation of the patellar tendon, no tenderness to palpation of the fibular head and no tenderness to palpation of the peroneal nerve. Effusion- trace. Tissue tension/texture is - soft. Pulses- 2+. Sensation- intact to light touch. Skin- Color- no ecchymosis and no erythema. Strength and Tone:Quadriceps- 5/5. Hamstrings- 5/5. ROM: Flexion:AROM- 110 . Note: painful Extension:AROM- 0 . Stability- Valgus Laxity at 30- None. Valgus Laxity at 0- None. Varus Laxity at 30- None. Varus Laxity at 0- None. Lachman- Negative. Anterior Drawer Test - Negative. Posterior Drawer Test- Negative. Deformities/Malalignments/Discrepancies- no deformities noted. Special Tests:McMurray Test (lateral) - negative. McMurray Test (medial)- painful. Patellar Compression Pain- no pain.  Neurological: She is alert and oriented to person, place, and time. She has normal reflexes.  Skin: Skin is warm and dry.  Psychiatric: She has a normal mood and affect.    xrays ordered, obtained, reviewed today by Dr. Shelle Iron with no fx, subluxation, dislocation, lytic or blastic lesions. There are mild degenerative changes noted in the right knee but her joint space is maintained without collapse. The left knee shows severe medial degenerative changes, bone-on-bone end-stage arthrosis with periarticular osteophytes.  Prior R knee NWB xrays with mild degenerative changes ([place] system)  Prior R knee MRI 9/23 report and images reviewed today by Dr.  Shelle Iron with complex MMT posterior horn, degeneration lateral meniscus with no distinct tear. 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 There is a full-thickness oblique fissure medial femoral condyle, grade 2 changes patella, grade 4 changes in small focal areas posterolateral lateral compartment.  Assessment/Plan R knee MMT, chondromalacia  Pt with R knee pain x 1 month, ongoing swelling and pain without injury, MRI positive for MMT which is likely causing her symptoms. She does have mild degenerative changes as well noted both on the xray and the MRI. We discussed tx options. Given her pain and meniscal tear on MRI would recommend proceeding with R knee arthroscopy, partial medial meniscectomy, and debridement. We discussed the procedure itself as well as risks, complications, alternatives including but not limited to DVT, PE, infx, bleeding, failure of procedure, need for secondary procedure, anesthesia risk, even death. We discussed possible exacerbation of arthritic symptoms, increased stiffness and pain. Discussed possible need for future steroid injections, viscosupplementation, repeat arthroscopy, even total knee. Reviewed post-op protocols, activity limitations, ice and elevation, expected outcome. She desires to proceed with right knee arthroscopy and all her questions were answered. She does have prior hx of DVT LLE following L knee scope and reportedly was anticoagulated for a year following that. She also has a family hx of DVT. Given her hx we will plan to place her on Xarelto post-operatively as DVT ppx for 2 weeks before switching to ASA 325mg  for continued DVT ppx. I discussed the importance of ice and elevation to reduce swelling as well as compression stockings, mechanical DVT ppx post-op. She does have a job where she can sit or stand but last knee scope she returned immediately to work and noted increased swelling and calf pain. I did advise her that a short time out of  work would be more appropriate to keep swelling under control.  Preop clearance obtained by Dr. Alberteen Sam.  Plan right knee arthroscopy, partial medial meniscectomy, debridement   Makaleigh Reinard M. for Dr. Shelle Iron 04/18/2013, 9:37 PM

## 2013-04-19 ENCOUNTER — Ambulatory Visit (HOSPITAL_COMMUNITY)
Admission: RE | Admit: 2013-04-19 | Discharge: 2013-04-19 | Disposition: A | Discharge status: Home / Self Care | Payer: Medicare Other | Source: Ambulatory Visit | Attending: Specialist | Admitting: Specialist

## 2013-04-19 ENCOUNTER — Ambulatory Visit (HOSPITAL_COMMUNITY): Payer: Medicare Other | Admitting: Anesthesiology

## 2013-04-19 ENCOUNTER — Encounter (HOSPITAL_COMMUNITY): Payer: Self-pay | Admitting: *Deleted

## 2013-04-19 ENCOUNTER — Encounter (HOSPITAL_COMMUNITY)
Admission: RE | Disposition: A | Discharge status: Home / Self Care | Payer: Self-pay | Source: Ambulatory Visit | Attending: Specialist

## 2013-04-19 ENCOUNTER — Encounter (HOSPITAL_COMMUNITY): Payer: Medicare Other | Admitting: Anesthesiology

## 2013-04-19 DIAGNOSIS — M2240 Chondromalacia patellae, unspecified knee: Secondary | ICD-10-CM | POA: Insufficient documentation

## 2013-04-19 DIAGNOSIS — M1710 Unilateral primary osteoarthritis, unspecified knee: Secondary | ICD-10-CM | POA: Insufficient documentation

## 2013-04-19 DIAGNOSIS — Z87891 Personal history of nicotine dependence: Secondary | ICD-10-CM | POA: Insufficient documentation

## 2013-04-19 DIAGNOSIS — M94261 Chondromalacia, right knee: Secondary | ICD-10-CM

## 2013-04-19 DIAGNOSIS — IMO0002 Reserved for concepts with insufficient information to code with codable children: Secondary | ICD-10-CM | POA: Insufficient documentation

## 2013-04-19 DIAGNOSIS — Z86718 Personal history of other venous thrombosis and embolism: Secondary | ICD-10-CM | POA: Insufficient documentation

## 2013-04-19 DIAGNOSIS — E785 Hyperlipidemia, unspecified: Secondary | ICD-10-CM | POA: Insufficient documentation

## 2013-04-19 DIAGNOSIS — X58XXXA Exposure to other specified factors, initial encounter: Secondary | ICD-10-CM | POA: Insufficient documentation

## 2013-04-19 DIAGNOSIS — K219 Gastro-esophageal reflux disease without esophagitis: Secondary | ICD-10-CM | POA: Insufficient documentation

## 2013-04-19 DIAGNOSIS — S83241A Other tear of medial meniscus, current injury, right knee, initial encounter: Secondary | ICD-10-CM

## 2013-04-19 DIAGNOSIS — I10 Essential (primary) hypertension: Secondary | ICD-10-CM | POA: Insufficient documentation

## 2013-04-19 DIAGNOSIS — S83289A Other tear of lateral meniscus, current injury, unspecified knee, initial encounter: Secondary | ICD-10-CM | POA: Insufficient documentation

## 2013-04-19 HISTORY — PX: KNEE ARTHROSCOPY WITH LATERAL MENISECTOMY: SHX6193

## 2013-04-19 SURGERY — ARTHROSCOPY, KNEE, WITH LATERAL MENISCECTOMY
Anesthesia: General | Site: Knee | Laterality: Right | Wound class: Clean

## 2013-04-19 MED ORDER — MIDAZOLAM HCL 5 MG/5ML IJ SOLN
INTRAMUSCULAR | Status: DC | PRN
  Administered 2013-04-19: 2 mg via INTRAVENOUS

## 2013-04-19 MED ORDER — OXYCODONE HCL 5 MG PO TABS: 5.0000 mg | ORAL_TABLET | Freq: Once | ORAL | Status: DC | PRN

## 2013-04-19 MED ORDER — MEPERIDINE HCL 50 MG/ML IJ SOLN: 6.2500 mg | INTRAMUSCULAR | Status: DC | PRN

## 2013-04-19 MED ORDER — HYDROCODONE-ACETAMINOPHEN 7.5-325 MG PO TABS: 1.0000 | ORAL_TABLET | ORAL | Status: DC | PRN

## 2013-04-19 MED ORDER — ONDANSETRON HCL 4 MG/2ML IJ SOLN
INTRAMUSCULAR | Status: DC | PRN
  Administered 2013-04-19: 4 mg via INTRAMUSCULAR

## 2013-04-19 MED ORDER — DEXAMETHASONE SODIUM PHOSPHATE 10 MG/ML IJ SOLN
INTRAMUSCULAR | Status: DC | PRN
  Administered 2013-04-19: 10 mg via INTRAVENOUS

## 2013-04-19 MED ORDER — PROMETHAZINE HCL 25 MG/ML IJ SOLN
6.2500 mg | INTRAMUSCULAR | Status: DC | PRN
  Administered 2013-04-19: 6.25 mg via INTRAVENOUS
  Filled 2013-04-19: qty 1

## 2013-04-19 MED ORDER — CEFAZOLIN SODIUM-DEXTROSE 2-3 GM-% IV SOLR
INTRAVENOUS | Status: AC
  Filled 2013-04-19: qty 50

## 2013-04-19 MED ORDER — OXYCODONE HCL 5 MG/5ML PO SOLN
5.0000 mg | Freq: Once | ORAL | Status: DC | PRN
  Filled 2013-04-19: qty 5

## 2013-04-19 MED ORDER — ONDANSETRON HCL 4 MG/2ML IJ SOLN
4.0000 mg | Freq: Once | INTRAMUSCULAR | Status: AC
  Administered 2013-04-19: 4 mg via INTRAVENOUS

## 2013-04-19 MED ORDER — LACTATED RINGERS IV SOLN
INTRAVENOUS | Status: DC
  Administered 2013-04-19: 1000 mL via INTRAVENOUS

## 2013-04-19 MED ORDER — LACTATED RINGERS IR SOLN
Status: DC | PRN
  Administered 2013-04-19: 1000 mL

## 2013-04-19 MED ORDER — HYDROMORPHONE HCL PF 1 MG/ML IJ SOLN: 0.2500 mg | INTRAMUSCULAR | Status: DC | PRN

## 2013-04-19 MED ORDER — EPINEPHRINE HCL 1 MG/ML IJ SOLN
INTRAMUSCULAR | Status: AC
  Filled 2013-04-19: qty 2

## 2013-04-19 MED ORDER — LACTATED RINGERS IV SOLN
INTRAVENOUS | Status: DC
  Administered 2013-04-19 (×2): via INTRAVENOUS

## 2013-04-19 MED ORDER — CEFAZOLIN SODIUM-DEXTROSE 2-3 GM-% IV SOLR
2.0000 g | INTRAVENOUS | Status: AC
  Administered 2013-04-19: 2 g via INTRAVENOUS

## 2013-04-19 MED ORDER — FENTANYL CITRATE 0.05 MG/ML IJ SOLN
INTRAMUSCULAR | Status: DC | PRN
  Administered 2013-04-19 (×2): 50 ug via INTRAVENOUS

## 2013-04-19 MED ORDER — EPINEPHRINE HCL 1 MG/ML IJ SOLN
INTRAMUSCULAR | Status: DC | PRN
  Administered 2013-04-19: 2 mL

## 2013-04-19 MED ORDER — BUPIVACAINE-EPINEPHRINE 0.5% -1:200000 IJ SOLN
INTRAMUSCULAR | Status: DC | PRN
  Administered 2013-04-19: 15 mL

## 2013-04-19 MED ORDER — RIVAROXABAN 10 MG PO TABS: 10.0000 mg | ORAL_TABLET | Freq: Every day | ORAL | Status: DC

## 2013-04-19 MED ORDER — EPHEDRINE SULFATE 50 MG/ML IJ SOLN
INTRAMUSCULAR | Status: DC | PRN
  Administered 2013-04-19: 10 mg via INTRAVENOUS
  Administered 2013-04-19: 5 mg via INTRAVENOUS

## 2013-04-19 MED ORDER — BUPIVACAINE-EPINEPHRINE PF 0.5-1:200000 % IJ SOLN
INTRAMUSCULAR | Status: AC
  Filled 2013-04-19: qty 30

## 2013-04-19 SURGICAL SUPPLY — 22 items
BANDAGE ELASTIC 6 VELCRO ST LF (GAUZE/BANDAGES/DRESSINGS) ×1 IMPLANT
BLADE 4.2CUDA (BLADE) IMPLANT
BLADE CUDA SHAVER 3.5 (BLADE) ×2 IMPLANT
CLOTH 2% CHLOROHEXIDINE 3PK (PERSONAL CARE ITEMS) ×2 IMPLANT
CLOTH BEACON ORANGE TIMEOUT ST (SAFETY) ×1 IMPLANT
DRSG ADAPTIC 3X8 NADH LF (GAUZE/BANDAGES/DRESSINGS) ×1 IMPLANT
DRSG EMULSION OIL 3X3 NADH (GAUZE/BANDAGES/DRESSINGS) ×2 IMPLANT
DRSG PAD ABDOMINAL 8X10 ST (GAUZE/BANDAGES/DRESSINGS) ×1 IMPLANT
DURAFORM SPONGE 2X2 SINGLE (Neuro Prosthesis/Implant) ×2 IMPLANT
DURAPREP 26ML APPLICATOR (WOUND CARE) ×2 IMPLANT
GLOVE BIOGEL PI IND STRL 8 (GLOVE) ×1 IMPLANT
GLOVE BIOGEL PI INDICATOR 8 (GLOVE) ×1
GLOVE SURG SS PI 8.0 STRL IVOR (GLOVE) ×4 IMPLANT
GOWN STRL REIN XL XLG (GOWN DISPOSABLE) ×4 IMPLANT
MANIFOLD NEPTUNE II (INSTRUMENTS) ×3 IMPLANT
PACK ARTHROSCOPY WL (CUSTOM PROCEDURE TRAY) ×2 IMPLANT
PADDING CAST COTTON 6X4 STRL (CAST SUPPLIES) ×2 IMPLANT
SET ARTHROSCOPY TUBING (MISCELLANEOUS) ×2
SET ARTHROSCOPY TUBING LN (MISCELLANEOUS) ×1 IMPLANT
SPONGE GAUZE 4X4 12PLY (GAUZE/BANDAGES/DRESSINGS) ×1 IMPLANT
SUT ETHILON 4 0 PS 2 18 (SUTURE) ×2 IMPLANT
WRAP KNEE MAXI GEL POST OP (GAUZE/BANDAGES/DRESSINGS) ×2 IMPLANT

## 2013-04-19 NOTE — Anesthesia Postprocedure Evaluation (Signed)
Anesthesia Post Note  Patient: Kaitlin Watkins  Procedure(s) Performed: Procedure(s) (LRB): ARTHROSCOPY RITH KNEE BILATERAL PARTIAL MENISECTOMY  (Right)  Anesthesia type: General  Patient location: PACU  Post pain: Pain level controlled  Post assessment: Post-op Vital signs reviewed  Last Vitals: BP 143/91  Pulse 62  Temp(Src) 35.9 C (Oral)  Resp 16  SpO2 100%  Post vital signs: Reviewed  Level of consciousness: sedated  Complications: No apparent anesthesia complications

## 2013-04-19 NOTE — Interval H&P Note (Signed)
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 After consideration of risks, benefits and other options for treatment, the patient has consented to  Procedure(s): KNEE ARTHROSCOPY WITH PARTIAL MEDIAL MENISECTOMY AND DEBRIDMENT (Right) as a surgical intervention .  The patient's history has been reviewed, patient examined, no change in status, stable for surgery.  I have reviewed the patient's chart and labs.  Questions were answered to the patient's satisfaction.     Kaitlin Watkins C

## 2013-04-19 NOTE — Interval H&P Note (Signed)
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 After consideration of risks, benefits and other options for treatment, the patient has consented to  Procedure(s): KNEE ARTHROSCOPY WITH PARTIAL MEDIAL MENISECTOMY AND DEBRIDMENT (Right) as a surgical intervention .  The patient's history has been reviewed, patient examined, no change in status, stable for surgery.  I have reviewed the patient's chart and labs.  Questions were answered to the patient's satisfaction.     [name]

## 2013-04-19 NOTE — Brief Op Note (Signed)
04/19/2013  12:50 PM  PATIENT:  Charissa Bash  67 y.o. 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  PROCEDURE:  Procedure(s): ARTHROSCOPY RITH KNEE BILATERAL PARTIAL MENISECTOMY  (Right)  SURGEON:  Surgeon(s) and Role:    * Javier Docker, MD - Primary  PHYSICIAN ASSISTANT:   ASSISTANTS: Bissell   ANESTHESIA:   general  EBL:  Total I/O In: 1000 [I.V.:1000] Out: -   BLOOD ADMINISTERED:none  DRAINS: none   LOCAL MEDICATIONS USED:  MARCAINE     SPECIMEN:  No Specimen  DISPOSITION OF SPECIMEN:  N/A  COUNTS:  YES  TOURNIQUET:  * No tourniquets in log *  DICTATION: .Other Dictation: Dictation Number [id number]  PLAN OF CARE: Discharge to home after PACU  PATIENT DISPOSITION:  PACU - hemodynamically stable.   Delay start of Pharmacological VTE agent (>24hrs) due to surgical blood loss or risk of bleeding: no

## 2013-04-19 NOTE — Anesthesia Preprocedure Evaluation (Addendum)
Anesthesia Evaluation  Patient identified by MRN, date of birth, ID band Patient awake    Reviewed: Allergy & Precautions, H&P , NPO status , Patient's Chart, lab work & pertinent test results  History of Anesthesia Complications (+) PONV and history of anesthetic complications  Airway       Dental  (+) Dental Advisory Given   Pulmonary neg pulmonary ROS,          Cardiovascular hypertension, Pt. on medications     Neuro/Psych negative neurological ROS  negative psych ROS   GI/Hepatic Neg liver ROS, GERD-  Medicated,  Endo/Other  negative endocrine ROS  Renal/GU negative Renal ROS     Musculoskeletal negative musculoskeletal ROS (+)   Abdominal (+) + obese,   Peds  Hematology negative hematology ROS (+)   Anesthesia Other Findings   Reproductive/Obstetrics negative OB ROS                          Anesthesia Physical Anesthesia Plan  ASA: II  Anesthesia Plan: General   Post-op Pain Management:    Induction: Intravenous  Airway Management Planned: LMA  Additional Equipment:   Intra-op Plan:   Post-operative Plan: Extubation in OR  Informed Consent: I have reviewed the patients History and Physical, chart, labs and discussed the procedure including the risks, benefits and alternatives for the proposed anesthesia with the patient or authorized representative who has indicated his/her understanding and acceptance.   Dental advisory given  Plan Discussed with: CRNA  Anesthesia Plan Comments:         Anesthesia Quick Evaluation

## 2013-04-19 NOTE — H&P (View-Only) (Signed)
Kaitlin Watkins is an 67 y.o. female.   Chief Complaint: right knee pain HPI: The patient is a 67 year old female who presents with knee complaints which began 6 weeks ago without any known injury. The patient describes the severity of the symptoms as severe. The patient feels that the symptoms are unchanging. Prior to being seen today the patient was previously evaluated by a primary care provider. Previous work-up for this problem has included knee x-rays and knee MRI. Past treatment for this problem has included application of ice, restricted activities, knee immobilizer, crutches (partial weight bearing), nonsteroidal anti-inflammatory drugs and non-opioid analgesics (ultram). Symptoms are reported to be located in the right medial knee and include knee pain, swelling, stiffness, decreased range of motion, difficulty bearing weight and difficulty ambulating, while symptoms reported today do not include redness, ecchymosis or locking. The patient does not report any radiation of symptoms. The patient describes their pain as sharp. Symptoms are exacerbated by motion at the knee, weight bearing, walking and squatting. Symptoms are relieved by rest and ice, while symptoms are not relieved by non-opioid analgesics. She denies prior hx of R knee pain, in fact, she has prior hx of L knee injury with post-traumatic arthritis, is s/p prior L knee scope by Dr Olin in the past with post-op DVT requiring anticoagulation for a year. She reports her right knee had been doing well when suddenly a month ago she started having pain, swelling, difficulty walking. She has now had to rely more on the left knee which has increased its pain, although she has been c/o pain and giving way in the left for some time now. She does note improvement of the right knee with rest but reports pain with any flexion or weightbearing activity. She denies instability but reports a constant pain. She is very limited in her activity due to this.  She has tried taking NSAIDs and ultram without significant relief.  Past Medical History  Diagnosis Date  . GERD (gastroesophageal reflux disease)   . Hyperlipidemia   . DVT (deep venous thrombosis) 2007    Left Leg  . Hypertension   . Knee pain, left anterior   . Tear of medial meniscus of right knee   . Arthritis   . PONV (postoperative nausea and vomiting)     Past Surgical History  Procedure Laterality Date  . Knee arthroscopy Left 1989  . Appendectomy  1975  . Carpal tunnel release Left 2009  . Cystostomy w/ bladder biopsy    . Refractive surgery Bilateral 2004  . Vaginal hysterectomy  1975    Pain  . Bilateral salpingectomy    . Leg surgery  2007    left leg DVT  . Abdominal hysterectomy  1975  . Skin cancer excision Left     foot  . Breast biopsy Left   . Tonsillectomy      Third Grade     Family History  Problem Relation Age of Onset  . Heart disease Mother     CHF  . Stroke Mother 75  . Cancer Mother 50    Cerivcal Cancer  . Stroke Father 58  . Alcohol abuse Father   . Heart disease Sister 59    MI  . Heart disease Brother   . Stroke Brother   . Heart disease Brother 50    MI  . Cancer Brother 55    Prostate   Social History:  reports that she quit smoking about 48 years ago.   Her smoking use included Cigarettes. She has a 10 pack-year smoking history. She has never used smokeless tobacco. She reports that she drinks alcohol. She reports that she does not use illicit drugs.  Allergies:  Allergies  Allergen Reactions  . Eggs Or Egg-Derived Products     Patient can eat eggs but cannot have a flu shot     (Not in a hospital admission)  Results for orders placed during the hospital encounter of 04/17/13 (from the past 48 hour(s))  CBC     Status: None   Collection Time    04/17/13  2:20 PM      Result Value Range   WBC 5.6  4.0 - 10.5 K/uL   RBC 4.93  3.87 - 5.11 MIL/uL   Hemoglobin 14.3  12.0 - 15.0 g/dL   HCT 42.1  36.0 - 46.0 %   MCV  85.4  78.0 - 100.0 fL   MCH 29.0  26.0 - 34.0 pg   MCHC 34.0  30.0 - 36.0 g/dL   RDW 13.5  11.5 - 15.5 %   Platelets 235  150 - 400 K/uL  BASIC METABOLIC PANEL     Status: Abnormal   Collection Time    04/17/13  2:20 PM      Result Value Range   Sodium 138  135 - 145 mEq/L   Potassium 4.5  3.5 - 5.1 mEq/L   Chloride 102  96 - 112 mEq/L   CO2 27  19 - 32 mEq/L   Glucose, Bld 97  70 - 99 mg/dL   BUN 13  6 - 23 mg/dL   Creatinine, Ser 0.92  0.50 - 1.10 mg/dL   Calcium 9.6  8.4 - 10.5 mg/dL   GFR calc non Af Amer 62 (*) >90 mL/min   GFR calc Af Amer 72 (*) >90 mL/min   Comment: (NOTE)     The eGFR has been calculated using the CKD EPI equation.     This calculation has not been validated in all clinical situations.     eGFR's persistently <90 mL/min signify possible Chronic Kidney     Disease.   Dg Chest 2 View  04/17/2013   CLINICAL DATA:  Preoperative respiratory films.  EXAM: CHEST  2 VIEW  COMPARISON:  None.  FINDINGS: Heart size and mediastinal contours are within normal limits. Both lungs are clear. Visualized skeletal structures are unremarkable.  IMPRESSION: No active cardiopulmonary disease.   Electronically Signed   By: Thomas  Dalessio M.D.   On: 04/17/2013 14:43    Review of Systems  Constitutional: Negative.   HENT: Negative.   Eyes: Negative.   Respiratory: Negative.   Cardiovascular: Negative.   Gastrointestinal: Negative.   Genitourinary: Negative.   Musculoskeletal: Positive for joint pain.  Skin: Negative.   Neurological: Negative.   Psychiatric/Behavioral: Negative.     There were no vitals taken for this visit. Physical Exam  Constitutional: She is oriented to person, place, and time. She appears well-developed and well-nourished.  HENT:  Head: Normocephalic and atraumatic.  Eyes: Conjunctivae and EOM are normal. Pupils are equal, round, and reactive to light.  Neck: Normal range of motion. Neck supple.  Cardiovascular: Normal rate and regular  rhythm.   Respiratory: Effort normal and breath sounds normal.  GI: Soft. Bowel sounds are normal.  Musculoskeletal:  Right Knee: Inspection and Palpation:Tenderness- mid 1/3 of the medial joint line tender to palpation and anterior 1/3 of the lateral joint line tender to palpation. no tenderness   to palpation of the superior calf, no tenderness to palpation of the pes anserine bursa, no tenderness to palpation of the quadriceps tendon, no tenderness to palpation of the patellar tendon, no tenderness to palpation of the fibular head and no tenderness to palpation of the peroneal nerve. Effusion- trace. Tissue tension/texture is - soft. Pulses- 2+. Sensation- intact to light touch. Skin- Color- no ecchymosis and no erythema. Strength and Tone:Quadriceps- 5/5. Hamstrings- 5/5. ROM: Flexion:AROM- 110 . Note: painful Extension:AROM- 0 . Stability- Valgus Laxity at 30- None. Valgus Laxity at 0- None. Varus Laxity at 30- None. Varus Laxity at 0- None. Lachman- Negative. Anterior Drawer Test - Negative. Posterior Drawer Test- Negative. Deformities/Malalignments/Discrepancies- no deformities noted. Special Tests:McMurray Test (lateral) - negative. McMurray Test (medial)- painful. Patellar Compression Pain- no pain.  Neurological: She is alert and oriented to person, place, and time. She has normal reflexes.  Skin: Skin is warm and dry.  Psychiatric: She has a normal mood and affect.    xrays ordered, obtained, reviewed today by Dr. Beane with no fx, subluxation, dislocation, lytic or blastic lesions. There are mild degenerative changes noted in the right knee but her joint space is maintained without collapse. The left knee shows severe medial degenerative changes, bone-on-bone end-stage arthrosis with periarticular osteophytes.  Prior R knee NWB xrays with mild degenerative changes ([place] system)  Prior R knee MRI 9/23 report and images reviewed today by Dr.  Beane with complex MMT posterior horn, degeneration lateral meniscus with no distinct tear. 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 There is a full-thickness oblique fissure medial femoral condyle, grade 2 changes patella, grade 4 changes in small focal areas posterolateral lateral compartment.  Assessment/Plan R knee MMT, chondromalacia  Pt with R knee pain x 1 month, ongoing swelling and pain without injury, MRI positive for MMT which is likely causing her symptoms. She does have mild degenerative changes as well noted both on the xray and the MRI. We discussed tx options. Given her pain and meniscal tear on MRI would recommend proceeding with R knee arthroscopy, partial medial meniscectomy, and debridement. We discussed the procedure itself as well as risks, complications, alternatives including but not limited to DVT, PE, infx, bleeding, failure of procedure, need for secondary procedure, anesthesia risk, even death. We discussed possible exacerbation of arthritic symptoms, increased stiffness and pain. Discussed possible need for future steroid injections, viscosupplementation, repeat arthroscopy, even total knee. Reviewed post-op protocols, activity limitations, ice and elevation, expected outcome. She desires to proceed with right knee arthroscopy and all her questions were answered. She does have prior hx of DVT LLE following L knee scope and reportedly was anticoagulated for a year following that. She also has a family hx of DVT. Given her hx we will plan to place her on Xarelto post-operatively as DVT ppx for 2 weeks before switching to ASA 325mg for continued DVT ppx. I discussed the importance of ice and elevation to reduce swelling as well as compression stockings, mechanical DVT ppx post-op. She does have a job where she can sit or stand but last knee scope she returned immediately to work and noted increased swelling and calf pain. I did advise her that a short time out of  work would be more appropriate to keep swelling under control.  Preop clearance obtained by Dr. Zanard.  Plan right knee arthroscopy, partial medial meniscectomy, debridement   Nicholl Onstott M. for Dr. Beane 04/18/2013, 9:37 PM    

## 2013-04-19 NOTE — Transfer of Care (Signed)
Immediate Anesthesia Transfer of Care Note  Patient: Kaitlin Watkins  Procedure(s) Performed: Procedure(s): ARTHROSCOPY RITH KNEE BILATERAL PARTIAL MENISECTOMY  (Right)  Patient Location: PACU  Anesthesia Type:General  Level of Consciousness: awake, alert , sedated and patient cooperative  Airway & Oxygen Therapy: Patient Spontanous Breathing and Patient connected to face mask oxygen  Post-op Assessment: Report given to PACU RN and Post -op Vital signs reviewed and stable  Post vital signs: Reviewed and stable  Complications: No apparent anesthesia complications

## 2013-04-20 ENCOUNTER — Encounter (HOSPITAL_COMMUNITY): Payer: Self-pay | Admitting: Specialist

## 2013-04-20 NOTE — Op Note (Signed)
NAMEJOANY, KHATIB NO.:  [id number]  MEDICAL RECORD NO.:  [id number]  LOCATION:  [facility]                         FACILITY:  [facility]  PHYSICIAN:  Jene Every, M.D.    DATE OF BIRTH:  [birth year]  DATE OF PROCEDURE:  04/19/2013 DATE OF DISCHARGE:  04/19/2013                              OPERATIVE REPORT   PREOPERATIVE DIAGNOSES:  Medial meniscus tear.  Degenerative joint disease, right knee.  POSTOPERATIVE DIAGNOSES: 1. Grade 4 medial femoral condyle arthrosis. 2. Complex tear of the medial meniscus. 3. Complex tear of the lateral meniscus. 4. Grade 3 chondromalacia of patella.  ANESTHESIA:  General.  PROCEDURE:  Partial medial and lateral meniscectomies, chondroplasty of patella, medial and lateral femoral condyles.  BRIEF HISTORY:  A 67 year old locking popping giving way.  MRI indicating meniscal tears, DJD, indicated for arthroscopic debridement. Failing conservative treatment.  Risks and benefits were discussed including bleeding, infection, damage to neurovascular structures, DVT, PE, anesthetic complications, etc.  TECHNIQUE:  With the patient in supine position, after induction of adequate general anesthesia and 2 g Kefzol, the right lower extremity was prepped and draped in usual sterile fashion.  A lateral parapatellar portal was fashioned with a #11 blade.  Ingress cannula was atraumatically placed.  Irrigant was utilized to insufflate the joint. Under direct visualization, medial parapatellar portal was fashioned with a #11 blade after localization with 18-gauge needle sparing the medial meniscus.  Noted there was extensive tearing of the posterior third, but the half of the medial meniscus with a radial tear and the junction of the middle and posterior third.  Introduced a Runner, broadcasting/film/video and resected approximately half of that involved segment to a stable base, further contoured with 3.5 Cuda shaver.  Larger grade 4 lesion of the femoral condyle,  light chondroplasty was performed with a grade 3 changes of the femoral condyle and of the tibial plateau.  ACL was unremarkable.  Lateral compartment revealed extensive tearing of the lateral meniscus.  I introduced a basket and resected approximately 1/3 of the medial aspect of the lateral meniscus, further contoured with 3.5 Cuda shaver.  Grade 3 changes of the femoral condyle, light chondroplasty performed here.  Remnant was stable to probe palpation.  Suprapatellar pouch, grade 3 changes of the patella and normal patellofemoral tracking.  Light chondroplasty performed here.  Gutters were unremarkable.  We re-visited all compartments, no further pathology amenable to arthroscopic intervention.  I, therefore, removed all instrumentations.  Portals were closed with 4-0 nylon simple sutures.  Marcaine 0.25% with epinephrine was infiltrated at the joint. The wound was dressed sterilely, woken without difficulty and transported to the recovery room in satisfactory condition.  The patient tolerated the procedure well.  No complications.  No assistant.  Minimal blood loss.     Jene Every, M.D.     Cordelia Pen  D:  04/19/2013  T:  04/20/2013  Job:  [id number]

## 2013-05-02 ENCOUNTER — Encounter: Payer: Self-pay | Admitting: Gastroenterology

## 2013-05-02 ENCOUNTER — Ambulatory Visit (INDEPENDENT_AMBULATORY_CARE_PROVIDER_SITE_OTHER): Payer: Medicare Other | Admitting: Gastroenterology

## 2013-05-02 VITALS — BP 96/60 | HR 80 | Ht 64.75 in | Wt 208.0 lb

## 2013-05-02 DIAGNOSIS — R1013 Epigastric pain: Secondary | ICD-10-CM

## 2013-05-02 DIAGNOSIS — K5900 Constipation, unspecified: Secondary | ICD-10-CM

## 2013-05-02 MED ORDER — GLYCOPYRROLATE 1 MG PO TABS: 1.0000 mg | ORAL_TABLET | Freq: Two times a day (BID) | ORAL | Status: DC

## 2013-05-02 NOTE — Progress Notes (Signed)
    History of Present Illness: This is a 67 year old female accompanied by her husband complains of ongoing epigastric pain and frequent abdominal bloating. She has constipation that is relieved using daily Colace and milk of magnesia. 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 She is currently taking 2 different PPIs and Carafate with no change in symptoms. Abdominal pelvic CT scan was unremarkable. Her symptoms do not appear to change with meals bowel movements or time of day.   Current Medications, Allergies, Past Medical History, Past Surgical History, Family History and Social History were reviewed in [employer] record.  Physical Exam: General: Well developed , well nourished, frustrated, no acute distress Head: Normocephalic and atraumatic Eyes:  sclerae anicteric, EOMI Ears: Normal auditory acuity Mouth: No deformity or lesions Lungs: Clear throughout to auscultation Heart: Regular rate and rhythm; no murmurs, rubs or bruits Abdomen: Soft, minimal epigastric tenderness and non distended. No masses, hepatosplenomegaly or hernias noted. Normal Bowel sounds Musculoskeletal: Symmetrical with no gross deformities  Pulses:  Normal pulses noted Extremities: No clubbing, cyanosis, edema or deformities noted Neurological: Alert oriented x 4, grossly nonfocal Psychological:  Alert and cooperative. Normal mood and affect  Assessment and Recommendations:  1. Epigastric pain. Gastritis with intestinal metaplasia noted however first pain has not responded at all to PPIs and Carafate. I am concerned about musculoskeletal and/or functional etiologies being the primary problem given that her pain is constant and does not change with any digestive function. Her symptoms are not typical for a biliary etiology or irritable bowel however will proceed with further evaluation to include a CCK HIDA and a trial of glycopyrrolate. Advised to take either Nexium or  Dexilant daily. Advised to discontinue black cohosh. Advised to discontinue Carafate if her symptoms worsen she can resume it.

## 2013-05-02 NOTE — Patient Instructions (Addendum)
Please pick either Nexium or Dexilant to take one tablet by mouth once daily.  Stop take your [facility] [place] for 7 days to see if this helps improve your symptoms.  Change Carafate to 4 times daily as needed  We have sent the following medications to your pharmacy for you to pick up at your convenience: Robinul.  You have been scheduled for a HIDA scan at [facility] (1st floor) on 05/14/13. Please arrive 15 minutes prior to your scheduled appointment on 1:19JY. Make certain not to have anything to eat or drink at least 6 hours prior to your test. Should this appointment date or time not work well for you, please call radiology scheduling at [phone]. Do not take your Norco atleast 6 hours prior to your test. __________________________________ ___________________________________ hepatobiliary (HIDA) scan is an imaging procedure used to diagnose problems in the liver, gallbladder and bile ducts. In the HIDA scan, a radioactive chemical or tracer is injected into a vein in your arm. The tracer is handled by the liver like bile. Bile is a fluid produced and excreted by your liver that helps your digestive system break down fats in the foods you eat. Bile is stored in your gallbladder and the gallbladder releases the bile when you eat a meal. A special nuclear medicine scanner (gamma camera) tracks the flow of the tracer from your liver into your gallbladder and small intestine.  During your HIDA scan  You'll be asked to change into a hospital gown before your HIDA scan begins. Your health care team will position you on a table, usually on your back. The radioactive tracer is then injected into a vein in your arm.The tracer travels through your bloodstream to your liver, where it's taken up by the bile-producing cells. The radioactive tracer travels with the bile from your liver into your gallbladder and through your bile ducts to your small intestine.You may feel some pressure while the  radioactive tracer is injected into your vein. As you lie on the table, a special gamma camera is positioned over your abdomen taking pictures of the tracer as it moves through your body. The gamma camera takes pictures continually for about an hour. You'll need to keep still during the HIDA scan. This can become uncomfortable, but you may find that you can lessen the discomfort by taking deep breaths and thinking about other things. Tell your health care team if you're uncomfortable. The radiologist will watch on a computer the progress of the radioactive tracer through your body. The HIDA scan may be stopped when the radioactive tracer is seen in the gallbladder and enters your small intestine. This typically takes about an hour. In some cases extra imaging will be performed if original images aren't satisfactory, if morphine is given to help visualize the gallbladder or if the medication CCK is given to look at the contraction of the gallbladder. This test typically takes 2 hours to complete. ________________________________________________________________________   Thank you for choosing me and [facility].  Venita Lick. Pleas Koch., MD., Clementeen Graham

## 2013-05-10 ENCOUNTER — Other Ambulatory Visit: Payer: Self-pay

## 2013-05-14 ENCOUNTER — Encounter (HOSPITAL_COMMUNITY)
Admission: RE | Admit: 2013-05-14 | Discharge: 2013-05-14 | Disposition: A | Discharge status: Home / Self Care | Payer: Medicare Other | Source: Ambulatory Visit | Attending: Gastroenterology | Admitting: Gastroenterology

## 2013-05-14 DIAGNOSIS — R109 Unspecified abdominal pain: Secondary | ICD-10-CM | POA: Insufficient documentation

## 2013-05-14 DIAGNOSIS — R1013 Epigastric pain: Secondary | ICD-10-CM

## 2013-05-14 MED ORDER — TECHNETIUM TC 99M MEBROFENIN IV KIT
5.1000 | PACK | Freq: Once | INTRAVENOUS | Status: AC | PRN
  Administered 2013-05-14: 5 via INTRAVENOUS

## 2013-06-04 ENCOUNTER — Encounter: Payer: Self-pay | Admitting: Family Medicine

## 2013-06-10 DIAGNOSIS — R5381 Other malaise: Secondary | ICD-10-CM | POA: Insufficient documentation

## 2013-06-10 DIAGNOSIS — M25569 Pain in unspecified knee: Secondary | ICD-10-CM | POA: Insufficient documentation

## 2013-06-10 DIAGNOSIS — E559 Vitamin D deficiency, unspecified: Secondary | ICD-10-CM | POA: Insufficient documentation

## 2013-06-10 DIAGNOSIS — R7983 Abnormal findings of blood amino-acid level: Secondary | ICD-10-CM | POA: Insufficient documentation

## 2013-06-22 ENCOUNTER — Encounter: Payer: Self-pay | Admitting: *Deleted

## 2013-07-06 ENCOUNTER — Other Ambulatory Visit: Payer: Self-pay | Admitting: *Deleted

## 2013-07-06 DIAGNOSIS — E785 Hyperlipidemia, unspecified: Secondary | ICD-10-CM

## 2013-07-10 ENCOUNTER — Other Ambulatory Visit: Payer: Medicare Other

## 2013-07-10 LAB — LIPID PANEL
Cholesterol: 191 mg/dL (ref 0–200)
HDL: 54 mg/dL (ref >39)
LDL Cholesterol: 102 mg/dL — ABNORMAL HIGH (ref 0–99)
Total CHOL/HDL Ratio: 3.5 Ratio
Triglycerides: 177 mg/dL — ABNORMAL HIGH (ref <150)
VLDL: 35 mg/dL (ref 0–40)

## 2013-07-10 LAB — COMPLETE METABOLIC PANEL WITH GFR
ALT: 28 U/L (ref 0–35)
AST: 26 U/L (ref 0–37)
Albumin: 3.9 g/dL (ref 3.5–5.2)
Alkaline Phosphatase: 109 U/L (ref 39–117)
BUN: 12 mg/dL (ref 6–23)
CO2: 30 mEq/L (ref 19–32)
Calcium: 8.8 mg/dL (ref 8.4–10.5)
Chloride: 103 mEq/L (ref 96–112)
Creat: 0.72 mg/dL (ref 0.50–1.10)
GFR, Est African American: >89 mL/min
GFR, Est Non African American: 86 mL/min
Glucose, Bld: 114 mg/dL — ABNORMAL HIGH (ref 70–99)
Potassium: 4.1 mEq/L (ref 3.5–5.3)
Sodium: 138 mEq/L (ref 135–145)
Total Bilirubin: 0.4 mg/dL (ref 0.3–1.2)
Total Protein: 6.5 g/dL (ref 6.0–8.3)

## 2013-07-12 ENCOUNTER — Ambulatory Visit (INDEPENDENT_AMBULATORY_CARE_PROVIDER_SITE_OTHER): Payer: Commercial Indemnity | Admitting: Family Medicine

## 2013-07-12 ENCOUNTER — Encounter: Payer: Self-pay | Admitting: Family Medicine

## 2013-07-12 VITALS — BP 123/82 | HR 74 | Resp 16 | Wt 218.0 lb

## 2013-07-12 DIAGNOSIS — E669 Obesity, unspecified: Secondary | ICD-10-CM

## 2013-07-12 DIAGNOSIS — E785 Hyperlipidemia, unspecified: Secondary | ICD-10-CM

## 2013-07-12 DIAGNOSIS — M25569 Pain in unspecified knee: Secondary | ICD-10-CM

## 2013-07-12 DIAGNOSIS — R7301 Impaired fasting glucose: Secondary | ICD-10-CM

## 2013-07-12 DIAGNOSIS — K219 Gastro-esophageal reflux disease without esophagitis: Secondary | ICD-10-CM

## 2013-07-12 LAB — POCT GLYCOSYLATED HEMOGLOBIN (HGB A1C): Hemoglobin A1C: 5.5

## 2013-07-12 MED ORDER — DICLOFENAC SODIUM 1 % TD GEL: 4.0000 g | Freq: Four times a day (QID) | TRANSDERMAL | Status: AC

## 2013-07-12 NOTE — Progress Notes (Signed)
Subjective:    Patient ID: Kaitlin Watkins, female    DOB: 1946-04-02, 68 y.o.   MRN: [id number]  HPI  Kaitlin Watkins is here today to go over her most recent lab results and to discuss the conditions listed below:   1)  Hyperlipidemia:  She is taking Lipitor daily.  She has done well on it and needs to have it refilled.     2)  Weight:  She continues to struggle with this issue and would like to do the HCG diet.    3)  Knees:  Her right knee is improved since her surgery.  Her left knee is what is bothering her the most now.  She is about to get what sounds like Synvisc injections into her left knee.    4)  GERD:  Her stomach problems seem to have improved.  She was previously taking OTC medications with [facility] for her hot flashes and was told by her GI doctor that this might aggravate her stomach so she stopped taking anything with this compound.  Her stomach problems did improve and she did not need any of the other medications for her stomach.  Recently, she has had some increased GERD which may be due to the holidays so she started taking OTC Nexium occasionally which she says works fine for her.     Review of Systems  Constitutional: Positive for unexpected weight change.  Respiratory: Negative for chest tightness and shortness of breath.   Cardiovascular: Negative for chest pain and palpitations.  Musculoskeletal: Positive for arthralgias (Left Knee Pain ). Negative for myalgias.  All other systems reviewed and are negative.    Past Medical History  Diagnosis Date  . GERD (gastroesophageal reflux disease)   . Hyperlipidemia   . DVT (deep venous thrombosis) 2007    Left Leg  . Hypertension   . Knee pain, left anterior   . Tear of medial meniscus of right knee   . Arthritis   . PONV (postoperative nausea and vomiting)      Past Surgical History  Procedure Laterality Date  . Knee arthroscopy Left 1989  . Appendectomy  1975  . Carpal tunnel release Left 2009  .  Cystostomy w/ bladder biopsy    . Refractive surgery Bilateral 2004  . Vaginal hysterectomy  1975    Pain  . Bilateral salpingectomy    . Leg surgery  2007    left leg DVT  . Abdominal hysterectomy  1975  . Skin cancer excision Left     foot  . Breast biopsy Left   . Tonsillectomy      Third Grade   . 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     Alcohol Use:  3-4 drinks / week   Drug Use:  None   Diet:  Regular   Exercise:  None   Hobbies: Motorcycle Riding     Family  History  Problem Relation Age of Onset  . Heart disease Mother     CHF  . Stroke Mother 40  . Cancer Mother 20    Cerivcal Cancer  . Stroke Father 61  . Alcohol abuse Father   . Heart disease Sister 22    MI  . Heart disease Brother   . Stroke Brother   . Heart disease Brother 29    MI  . Cancer Brother 69    Prostate     Current Outpatient Prescriptions on File Prior to Visit  Medication Sig Dispense Refill  . atorvastatin (LIPITOR) 80 MG tablet Take 80 mg by mouth every evening.      Marland Kitchen b complex vitamins tablet Take 1 tablet by mouth daily.      . Coenzyme Q10 (CO Q-10) 200 MG CAPS Take 200 mg by mouth daily.      Marland Kitchen docusate sodium (COLACE) 100 MG capsule Take 400 mg by mouth daily at 12 noon.       Marland Kitchen HYDROcodone-acetaminophen (NORCO) 7.5-325 MG per tablet Take 1-2 tablets by mouth every 4 (four) hours as needed for pain.  40 tablet  0  .  losartan-hydrochlorothiazide (HYZAAR) 50-12.5 MG per tablet Take 1 tablet by mouth every morning.       No current facility-administered medications on file prior to visit.     Allergies  Allergen Reactions  . Eggs Or Egg-Derived Products     Patient can eat eggs but cannot have a flu shot     Immunization History  Administered Date(s) Administered  . Zoster 07/05/2008      Objective:   Physical Exam  Constitutional: She appears well-nourished. No distress.  HENT:  Head: Normocephalic.  Eyes: No scleral icterus.  Neck: No thyromegaly present.  Cardiovascular: Normal rate, regular rhythm and normal heart sounds.   Pulmonary/Chest: Effort normal and breath sounds normal.  Musculoskeletal: She exhibits no edema and no tenderness.  Neurological: She is alert.  Skin: Skin is warm and dry.  Psychiatric: She has a normal mood and affect. Her behavior is normal. Judgment and thought content normal.      Assessment & Plan:    Kaitlin Watkins was seen today for medication management.  Diagnoses and associated orders for this visit:  IFG (impaired fasting glucose) Comments: Her FBS was elevated (114) so we checked an A1c which was perfect at 5.5%.   - POCT glycosylated hemoglobin (Hb A1C)  Other and unspecified hyperlipidemia Comments: She had a huge drop in her LDL (102 down from 294)!  She has done fine on the Lipitor 80 mg so she will remain on this medication.  She is getting her Lipitor from [facility].    GERD (gastroesophageal reflux disease) Comments: If she continues to need to take the OTC Nexium, she might elect to get omeprazole from [facility] for $37.    Knee pain, acute, unspecified laterality - diclofenac sodium (VOLTAREN) 1 % GEL; Apply 4 g topically 4 (four) times daily.  Obesity, unspecified Comments: Once she gets her left knee straight, she will come in for the HCG shots.  TIME SPENT "FACE TO FACE" WITH PATIENT -  30 MINS

## 2013-07-15 NOTE — Patient Instructions (Signed)
1)  Cholesterol - Your numbers are great on Lipitor so stay on it.    2)  Knee Pain - Apply the Voltaren Gel as needed.  3)  Weight - Return for the HCG diet once you are completed with your knee treatment and you have 60 days to commit to the program (500 calories then 1000 calories).    4)  GERD - If you continue to need the OTC Nexium, you might consider getting omeprazole 20 mg vs pantoprazole 40 mg from [facility] for $37.     Diet for Gastroesophageal Reflux Disease, Adult Reflux (acid reflux) is when acid from your stomach flows up into the esophagus. When acid comes in contact with the esophagus, the acid causes irritation and soreness (inflammation) in the esophagus. When reflux happens often or so severely that it causes damage to the esophagus, it is called gastroesophageal reflux disease (GERD). Nutrition therapy can help ease the discomfort of GERD. FOODS OR DRINKS TO AVOID OR LIMIT  Smoking or chewing tobacco. Nicotine is one of the most potent stimulants to acid production in the gastrointestinal tract.  Caffeinated and decaffeinated coffee and black tea.  Regular or low-calorie carbonated beverages or energy drinks (caffeine-free carbonated beverages are allowed).   Strong spices, such as black pepper, white pepper, red pepper, cayenne, curry powder, and chili powder.  Peppermint or spearmint.  Chocolate.  High-fat foods, including meats and fried foods. Extra added fats including oils, butter, salad dressings, and nuts. Limit these to less than 8 tsp per day.  Fruits and vegetables if they are not tolerated, such as citrus fruits or tomatoes.  Alcohol.  Any food that seems to aggravate your condition. If you have questions regarding your diet, call your caregiver or a registered dietitian. OTHER THINGS THAT MAY HELP GERD INCLUDE:   Eating your meals slowly, in a relaxed setting.  Eating 5 to 6 small meals per day instead of 3 large meals.  Eliminating food for  a period of time if it causes distress.  Not lying down until 3 hours after eating a meal.  Keeping the head of your bed raised 6 to 9 inches (15 to 23 cm) by using a foam wedge or blocks under the legs of the bed. Lying flat may make symptoms worse.  Being physically active. Weight loss may be helpful in reducing reflux in overweight or obese adults.  Wear loose fitting clothing EXAMPLE MEAL PLAN This meal plan is approximately 2,000 calories based on [email or web address] meal planning guidelines. Breakfast   cup cooked oatmeal.  1 cup strawberries.  1 cup low-fat milk.  1 oz almonds. Snack  1 cup cucumber slices.  6 oz yogurt (made from low-fat or fat-free milk). Lunch  2 slice whole-wheat bread.  2 oz sliced [place].  2 tsp mayonnaise.  1 cup blueberries.  1 cup snap peas. Snack  6 whole-wheat crackers.  1 oz string cheese. Dinner   cup brown rice.  1 cup mixed veggies.  1 tsp olive oil.  3 oz grilled fish. Document Released: 06/21/2005 Document Revised: 09/13/2011 Document Reviewed: 05/07/2011 [facility] Patient Information 2014 [place], [place].

## 2013-08-15 ENCOUNTER — Ambulatory Visit: Payer: Commercial Indemnity

## 2013-08-16 ENCOUNTER — Other Ambulatory Visit: Payer: Self-pay | Admitting: Family Medicine

## 2013-08-16 MED ORDER — PHENDIMETRAZINE TARTRATE 35 MG PO TABS: 1.0000 | ORAL_TABLET | Freq: Three times a day (TID) | ORAL | Status: DC

## 2013-08-22 ENCOUNTER — Other Ambulatory Visit: Payer: Commercial Indemnity

## 2013-09-11 ENCOUNTER — Ambulatory Visit (INDEPENDENT_AMBULATORY_CARE_PROVIDER_SITE_OTHER): Payer: Managed Care, Other (non HMO) | Admitting: Family Medicine

## 2013-09-11 ENCOUNTER — Ambulatory Visit: Payer: Commercial Indemnity | Admitting: Family Medicine

## 2013-09-11 ENCOUNTER — Encounter: Payer: Self-pay | Admitting: Family Medicine

## 2013-09-11 VITALS — BP 106/75 | HR 75 | Resp 16 | Ht 64.75 in | Wt 204.0 lb

## 2013-09-11 DIAGNOSIS — M7989 Other specified soft tissue disorders: Secondary | ICD-10-CM

## 2013-09-11 DIAGNOSIS — Z23 Encounter for immunization: Secondary | ICD-10-CM

## 2013-09-11 DIAGNOSIS — E669 Obesity, unspecified: Secondary | ICD-10-CM

## 2013-09-11 MED ORDER — PHENTERMINE HCL 37.5 MG PO TABS: 37.5000 mg | ORAL_TABLET | Freq: Every day | ORAL | Status: DC
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
## 2013-09-11 NOTE — Patient Instructions (Signed)
1)  Weight - You have  done well on Phase II of the HCG diet. After 5 more days of 500 calories, move on to Phase III where you will limit her intake of starch and sugar and calories to 1000.     Exercise to Lose Weight Exercise and a healthy diet may help you lose weight. Your doctor may suggest specific exercises. EXERCISE IDEAS AND TIPS  Choose low-cost things you enjoy doing, such as walking, bicycling, or exercising to workout videos.  Take stairs instead of the elevator.  Walk during your lunch break.  Park your car further away from work or school.  Go to a gym or an exercise class.  Start with 5 to 10 minutes of exercise each day. Build up to 30 minutes of exercise 4 to 6 days a week.  Wear shoes with good support and comfortable clothes.  Stretch before and after working out.  Work out until you breathe harder and your heart beats faster.  Drink extra water when you exercise.  Do not do so much that you hurt yourself, feel dizzy, or get very short of breath. Exercises that burn about 150 calories:  Running 1  miles in 15 minutes.  Playing volleyball for 45 to 60 minutes.  Washing and waxing a car for 45 to 60 minutes.  Playing touch football for 45 minutes.  Walking 1  miles in 35 minutes.  Pushing a stroller 1  miles in 30 minutes.  Playing basketball for 30 minutes.  Raking leaves for 30 minutes.  Bicycling 5 miles in 30 minutes.  Walking 2 miles in 30 minutes.  Dancing for 30 minutes.  Shoveling snow for 15 minutes.  Swimming laps for 20 minutes.  Walking up stairs for 15 minutes.  Bicycling 4 miles in 15 minutes.  Gardening for 30 to 45 minutes.  Jumping rope for 15 minutes.  Washing windows or floors for 45 to 60 minutes. Document Released: 07/24/2010 Document Revised: 09/13/2011 Document Reviewed: 07/24/2010 [facility] Patient Information 2014 [place], [place].

## 2013-09-13 MED ORDER — FUROSEMIDE 40 MG PO TABS: ORAL_TABLET | ORAL | Status: DC

## 2013-09-26 ENCOUNTER — Telehealth (INDEPENDENT_AMBULATORY_CARE_PROVIDER_SITE_OTHER): Payer: Self-pay

## 2013-09-26 NOTE — Telephone Encounter (Signed)
Called to request medical records for patient appointment on 09/27/13 w/Dr. Hassell Done.  Unable to leave a message.

## 2013-09-27 ENCOUNTER — Encounter (INDEPENDENT_AMBULATORY_CARE_PROVIDER_SITE_OTHER): Payer: Self-pay | Admitting: Surgery

## 2013-09-27 ENCOUNTER — Ambulatory Visit (INDEPENDENT_AMBULATORY_CARE_PROVIDER_SITE_OTHER): Payer: Commercial Indemnity | Admitting: Surgery

## 2013-09-27 VITALS — BP 110/70 | HR 88 | Temp 98.5°F | Resp 12 | Ht 66.0 in | Wt 197.6 lb

## 2013-09-27 DIAGNOSIS — D1779 Benign lipomatous neoplasm of other sites: Secondary | ICD-10-CM

## 2013-09-27 DIAGNOSIS — D171 Benign lipomatous neoplasm of skin and subcutaneous tissue of trunk: Secondary | ICD-10-CM | POA: Insufficient documentation

## 2013-09-27 NOTE — Progress Notes (Signed)
Chief Complaint:  Mass on the right of the midline  History of Present Illness:  Kaitlin Watkins is an 68 y.o. female with a mass in the middle of her back referred by Dr. Allyn Kenner.  This feels like a lipoma of the mid back.  Would excise under general at [facility].  Procedure explained to patient.   Past Medical History  Diagnosis Date  . GERD (gastroesophageal reflux disease)   . Hyperlipidemia   . DVT (deep venous thrombosis) 2007    Left Leg  . Hypertension   . Knee pain, left anterior   . Tear of medial meniscus of right knee   . Arthritis   . PONV (postoperative nausea and vomiting)     Past Surgical History  Procedure Laterality Date  . Knee arthroscopy Left 1989  . Appendectomy  1975  . Carpal tunnel release Left 2009  . Cystostomy w/ bladder biopsy    . Refractive surgery Bilateral 2004  . Vaginal hysterectomy  1975    Pain  . Bilateral salpingectomy    . Leg surgery  2007    left leg DVT  . Abdominal hysterectomy  1975  . Skin cancer excision Left     foot  . Breast biopsy Left   . Tonsillectomy      Third Grade   . Knee arthroscopy with lateral menisectomy Right 04/19/2013    Procedure: ARTHROSCOPY RITH KNEE BILATERAL PARTIAL MENISECTOMY ;  Surgeon: Johnn Hai, MD;  Location: [facility];  Service: Orthopedics;  Laterality: Right;    Current Outpatient Prescriptions  Medication Sig Dispense Refill  . atorvastatin (LIPITOR) 80 MG tablet Take 80 mg by mouth every evening.      . Coenzyme Q10 (CO Q-10) 200 MG CAPS Take 200 mg by mouth daily.      Marland Kitchen losartan-hydrochlorothiazide (HYZAAR) 50-12.5 MG per tablet Take 1 tablet by mouth every morning.      . Ascorbic Acid (VITAMIN C) 1000 MG tablet Take 1,000 mg by mouth daily.      Marland Kitchen b complex vitamins tablet Take 1 tablet by mouth daily.      Marland Kitchen BIOTIN 5000 PO Take by mouth.      . docusate sodium (COLACE) 100 MG capsule Take 400 mg by mouth daily at 12 noon.       Marland Kitchen esomeprazole (NEXIUM) 20 MG capsule Take 40 mg by  mouth daily at 12 noon.      . furosemide (LASIX) 40 MG tablet Take 1/2 - 1 tab daily for increased fluid  90 tablet  3  . HYDROcodone-acetaminophen (NORCO) 7.5-325 MG per tablet Take 1-2 tablets by mouth every 4 (four) hours as needed for pain.  40 tablet  0  . Misc Natural Products (LUTEIN 20 PO) Take by mouth.      . Misc Natural Products (OSTEO BI-FLEX JOINT SHIELD PO) Take by mouth.      . Phendimetrazine Tartrate 35 MG TABS Take 1 tablet (35 mg total) by mouth 3 (three) times daily before meals.  90 each  0  . phentermine (ADIPEX-P) 37.5 MG tablet Take 1 tablet (37.5 mg total) by mouth daily before breakfast.  30 tablet  0   No current facility-administered medications for this visit.   Eggs or egg-derived products Family History  Problem Relation Age of Onset  . Heart disease Mother     CHF  . Stroke Mother 68  . Cancer Mother 82    Cerivcal Cancer  . Stroke  Father 14  . Alcohol abuse Father   . Heart disease Sister 73    MI  . Heart disease Brother   . Stroke Brother   . Heart disease Brother 7    MI  . Cancer Brother 39    Prostate   Social History:   reports that she quit smoking about 49 years ago. Her smoking use included Cigarettes. She has a 10 pack-year smoking history. She has never used smokeless tobacco. She reports that she drinks alcohol. She reports that she does not use illicit drugs.   REVIEW OF SYSTEMS - PERTINENT POSITIVES ONLY: Neg for DVT-just getting over gi bug  Physical Exam:   Blood pressure 110/70, pulse 88, temperature 98.5 F (36.9 C), temperature source Oral, resp. rate 12, height 5\' 6"  (1.676 m), weight 197 lb 9.6 oz (89.631 kg). Body mass index is 31.91 kg/(m^2).  Gen:  WDWN WF NAD  Neurological: Alert and oriented to person, place, and time. Motor and sensory function is grossly intact  Head: Normocephalic and atraumatic.  Eyes: Conjunctivae are normal. Pupils are equal, round, and reactive to light. No scleral icterus.  Neck: Normal  range of motion. Neck supple. No tracheal deviation or thyromegaly present.  Cardiovascular:  SR without murmurs or gallops.  No carotid bruits Respiratory: Effort normal.  No respiratory distress. No chest wall tenderness. Breath sounds normal.  No wheezes, rales or rhonchi.  Back:  3 cm soft mobile mass to the right of the midline about the mid back adjacent the scapula.   Abdomen:  nontender GU: Musculoskeletal: Normal range of motion. Extremities are nontender. No cyanosis, edema or clubbing noted Lymphadenopathy: No cervical, preauricular, postauricular or axillary adenopathy is present Skin: Skin is warm and dry. No rash noted. No diaphoresis. No erythema. No pallor. Pscyh: Normal mood and affect. Behavior is normal. Judgment and thought content normal.   LABORATORY RESULTS: No results found for this or any previous visit (from the past 48 hour(s)).  RADIOLOGY RESULTS: No results found.  Problem List: Patient Active Problem List   Diagnosis Date Noted  . Other malaise and fatigue 06/10/2013  . Unspecified vitamin D deficiency 06/10/2013  . 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 Knee pain, acute 06/10/2013  . 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 Tear of medial meniscus of right knee 04/19/2013  . Other and unspecified hyperlipidemia 04/07/2013  . Essential hypertension, benign 03/28/2013  . Obesity, unspecified 03/28/2013  . Medial meniscus tear 03/28/2013  . GERD (gastroesophageal reflux disease)     Assessment & Plan: Mid back lipoma-for excision under general    Matt B. Hassell Done, MD, [facility] [phone] beeper [phone]  09/27/2013 12:05 PM

## 2013-09-27 NOTE — Patient Instructions (Signed)

## 2013-10-11 ENCOUNTER — Other Ambulatory Visit (HOSPITAL_COMMUNITY): Payer: Self-pay | Admitting: Specialist

## 2013-10-11 DIAGNOSIS — Z86718 Personal history of other venous thrombosis and embolism: Secondary | ICD-10-CM

## 2013-10-11 DIAGNOSIS — I82409 Acute embolism and thrombosis of unspecified deep veins of unspecified lower extremity: Secondary | ICD-10-CM

## 2013-10-12 ENCOUNTER — Ambulatory Visit (HOSPITAL_COMMUNITY)
Admission: RE | Admit: 2013-10-12 | Discharge: 2013-10-12 | Disposition: A | Discharge status: Home / Self Care | Payer: Managed Care, Other (non HMO) | Source: Ambulatory Visit | Attending: Specialist | Admitting: Specialist

## 2013-10-12 DIAGNOSIS — Z01810 Encounter for preprocedural cardiovascular examination: Secondary | ICD-10-CM

## 2013-10-12 DIAGNOSIS — Z86718 Personal history of other venous thrombosis and embolism: Secondary | ICD-10-CM | POA: Insufficient documentation

## 2013-10-12 DIAGNOSIS — I82409 Acute embolism and thrombosis of unspecified deep veins of unspecified lower extremity: Secondary | ICD-10-CM

## 2013-10-12 DIAGNOSIS — Z01818 Encounter for other preprocedural examination: Secondary | ICD-10-CM | POA: Insufficient documentation

## 2013-10-12 NOTE — Progress Notes (Signed)
VASCULAR LAB PRELIMINARY  PRELIMINARY  PRELIMINARY  PRELIMINARY  Left lower extremity venous duplex completed.    Preliminary report:  Left:  No evidence of DVT, superficial thrombosis, or Baker's cyst.  Nani Ravens, RVT 10/12/2013, 9:56 AM

## 2013-10-22 ENCOUNTER — Encounter (HOSPITAL_BASED_OUTPATIENT_CLINIC_OR_DEPARTMENT_OTHER): Payer: Self-pay | Admitting: *Deleted

## 2013-10-22 NOTE — Progress Notes (Signed)
Will come in for bmet-told to stop phentermine, Hx dvt just had doppler studies-neg

## 2013-10-23 ENCOUNTER — Encounter (HOSPITAL_BASED_OUTPATIENT_CLINIC_OR_DEPARTMENT_OTHER)
Admission: RE | Admit: 2013-10-23 | Discharge: 2013-10-23 | Disposition: A | Discharge status: Home / Self Care | Payer: Managed Care, Other (non HMO) | Source: Ambulatory Visit | Attending: Surgery | Admitting: Surgery

## 2013-10-23 DIAGNOSIS — Z87891 Personal history of nicotine dependence: Secondary | ICD-10-CM | POA: Diagnosis not present

## 2013-10-23 DIAGNOSIS — Z86718 Personal history of other venous thrombosis and embolism: Secondary | ICD-10-CM | POA: Diagnosis not present

## 2013-10-23 DIAGNOSIS — D1779 Benign lipomatous neoplasm of other sites: Secondary | ICD-10-CM | POA: Diagnosis not present

## 2013-10-23 DIAGNOSIS — E785 Hyperlipidemia, unspecified: Secondary | ICD-10-CM | POA: Diagnosis not present

## 2013-10-23 DIAGNOSIS — I10 Essential (primary) hypertension: Secondary | ICD-10-CM | POA: Diagnosis not present

## 2013-10-23 DIAGNOSIS — R229 Localized swelling, mass and lump, unspecified: Secondary | ICD-10-CM | POA: Diagnosis present

## 2013-10-23 DIAGNOSIS — K219 Gastro-esophageal reflux disease without esophagitis: Secondary | ICD-10-CM | POA: Diagnosis not present

## 2013-10-23 DIAGNOSIS — Z79899 Other long term (current) drug therapy: Secondary | ICD-10-CM | POA: Diagnosis not present

## 2013-10-23 LAB — BASIC METABOLIC PANEL
BUN: 15 mg/dL (ref 6–23)
CALCIUM: 9.6 mg/dL (ref 8.4–10.5)
CO2: 26 mEq/L (ref 19–32)
CREATININE: 0.83 mg/dL (ref 0.50–1.10)
Chloride: 103 mEq/L (ref 96–112)
GFR calc Af Amer: 82 mL/min — ABNORMAL LOW (ref >90)
GFR, EST NON AFRICAN AMERICAN: 70 mL/min — AB (ref >90)
Glucose, Bld: 97 mg/dL (ref 70–99)
Potassium: 4.2 mEq/L (ref 3.7–5.3)
SODIUM: 142 meq/L (ref 137–147)

## 2013-10-26 ENCOUNTER — Encounter (HOSPITAL_BASED_OUTPATIENT_CLINIC_OR_DEPARTMENT_OTHER): Payer: Managed Care, Other (non HMO) | Admitting: Certified Registered"

## 2013-10-26 ENCOUNTER — Ambulatory Visit (HOSPITAL_BASED_OUTPATIENT_CLINIC_OR_DEPARTMENT_OTHER)
Admission: RE | Admit: 2013-10-26 | Discharge: 2013-10-26 | Disposition: A | Discharge status: Home / Self Care | Payer: Managed Care, Other (non HMO) | Source: Ambulatory Visit | Attending: Surgery | Admitting: Surgery

## 2013-10-26 ENCOUNTER — Encounter (HOSPITAL_BASED_OUTPATIENT_CLINIC_OR_DEPARTMENT_OTHER): Payer: Self-pay | Admitting: Certified Registered"

## 2013-10-26 ENCOUNTER — Encounter (HOSPITAL_BASED_OUTPATIENT_CLINIC_OR_DEPARTMENT_OTHER)
Admission: RE | Disposition: A | Discharge status: Home / Self Care | Payer: Self-pay | Source: Ambulatory Visit | Attending: Surgery

## 2013-10-26 ENCOUNTER — Ambulatory Visit (HOSPITAL_BASED_OUTPATIENT_CLINIC_OR_DEPARTMENT_OTHER): Payer: Managed Care, Other (non HMO) | Admitting: Certified Registered"

## 2013-10-26 DIAGNOSIS — Z87891 Personal history of nicotine dependence: Secondary | ICD-10-CM | POA: Insufficient documentation

## 2013-10-26 DIAGNOSIS — K219 Gastro-esophageal reflux disease without esophagitis: Secondary | ICD-10-CM | POA: Insufficient documentation

## 2013-10-26 DIAGNOSIS — D1739 Benign lipomatous neoplasm of skin and subcutaneous tissue of other sites: Secondary | ICD-10-CM

## 2013-10-26 DIAGNOSIS — E785 Hyperlipidemia, unspecified: Secondary | ICD-10-CM | POA: Insufficient documentation

## 2013-10-26 DIAGNOSIS — I10 Essential (primary) hypertension: Secondary | ICD-10-CM | POA: Insufficient documentation

## 2013-10-26 DIAGNOSIS — D1779 Benign lipomatous neoplasm of other sites: Secondary | ICD-10-CM | POA: Diagnosis not present

## 2013-10-26 DIAGNOSIS — Z79899 Other long term (current) drug therapy: Secondary | ICD-10-CM | POA: Insufficient documentation

## 2013-10-26 DIAGNOSIS — Z86718 Personal history of other venous thrombosis and embolism: Secondary | ICD-10-CM | POA: Insufficient documentation

## 2013-10-26 HISTORY — DX: Presence of spectacles and contact lenses: Z97.3

## 2013-10-26 HISTORY — PX: LIPOMA EXCISION: SHX5283

## 2013-10-26 LAB — POCT HEMOGLOBIN-HEMACUE: HEMOGLOBIN: 14.3 g/dL (ref 12.0–15.0)

## 2013-10-26 SURGERY — EXCISION LIPOMA
Anesthesia: General | Site: Back

## 2013-10-26 MED ORDER — BUPIVACAINE HCL (PF) 0.5 % IJ SOLN
INTRAMUSCULAR | Status: DC | PRN
Start: 2013-10-26 — End: 2013-10-26
  Administered 2013-10-26: 7 mL

## 2013-10-26 MED ORDER — SUCCINYLCHOLINE CHLORIDE 20 MG/ML IJ SOLN
INTRAMUSCULAR | Status: DC | PRN
  Administered 2013-10-26: 80 mg via INTRAVENOUS

## 2013-10-26 MED ORDER — FENTANYL CITRATE 0.05 MG/ML IJ SOLN
INTRAMUSCULAR | Status: DC | PRN
  Administered 2013-10-26 (×2): 25 ug via INTRAVENOUS
  Administered 2013-10-26: 50 ug via INTRAVENOUS

## 2013-10-26 MED ORDER — KETOROLAC TROMETHAMINE 30 MG/ML IJ SOLN: 15.0000 mg | Freq: Once | INTRAMUSCULAR | Status: DC | PRN

## 2013-10-26 MED ORDER — BUPIVACAINE HCL (PF) 0.5 % IJ SOLN
INTRAMUSCULAR | Status: AC
  Filled 2013-10-26: qty 30

## 2013-10-26 MED ORDER — CEFAZOLIN SODIUM-DEXTROSE 2-3 GM-% IV SOLR
INTRAVENOUS | Status: AC
  Filled 2013-10-26: qty 50

## 2013-10-26 MED ORDER — FENTANYL CITRATE 0.05 MG/ML IJ SOLN: 50.0000 ug | INTRAMUSCULAR | Status: DC | PRN

## 2013-10-26 MED ORDER — ONDANSETRON HCL 4 MG/2ML IJ SOLN
INTRAMUSCULAR | Status: DC | PRN
  Administered 2013-10-26: 4 mg via INTRAVENOUS

## 2013-10-26 MED ORDER — LACTATED RINGERS IV SOLN
INTRAVENOUS | Status: DC
  Administered 2013-10-26 (×2): via INTRAVENOUS

## 2013-10-26 MED ORDER — LIDOCAINE HCL (CARDIAC) 20 MG/ML IV SOLN
INTRAVENOUS | Status: DC | PRN
  Administered 2013-10-26: 30 mg via INTRAVENOUS

## 2013-10-26 MED ORDER — MIDAZOLAM HCL 2 MG/2ML IJ SOLN: 0.5000 mg | Freq: Once | INTRAMUSCULAR | Status: DC | PRN

## 2013-10-26 MED ORDER — PROMETHAZINE HCL 25 MG/ML IJ SOLN: 6.2500 mg | INTRAMUSCULAR | Status: DC | PRN

## 2013-10-26 MED ORDER — HYDROCODONE-ACETAMINOPHEN 5-325 MG PO TABS: 1.0000 | ORAL_TABLET | ORAL | Status: DC | PRN

## 2013-10-26 MED ORDER — MEPERIDINE HCL 25 MG/ML IJ SOLN: 6.2500 mg | INTRAMUSCULAR | Status: DC | PRN

## 2013-10-26 MED ORDER — DEXAMETHASONE SODIUM PHOSPHATE 4 MG/ML IJ SOLN
INTRAMUSCULAR | Status: DC | PRN
  Administered 2013-10-26: 5 mg via INTRAVENOUS

## 2013-10-26 MED ORDER — MIDAZOLAM HCL 2 MG/2ML IJ SOLN: 1.0000 mg | INTRAMUSCULAR | Status: DC | PRN

## 2013-10-26 MED ORDER — FENTANYL CITRATE 0.05 MG/ML IJ SOLN: 25.0000 ug | INTRAMUSCULAR | Status: DC | PRN

## 2013-10-26 MED ORDER — EPHEDRINE SULFATE 50 MG/ML IJ SOLN
INTRAMUSCULAR | Status: DC | PRN
  Administered 2013-10-26 (×2): 10 mg via INTRAVENOUS

## 2013-10-26 MED ORDER — PROPOFOL 10 MG/ML IV BOLUS
INTRAVENOUS | Status: DC | PRN
  Administered 2013-10-26: 110 mg via INTRAVENOUS

## 2013-10-26 MED ORDER — FENTANYL CITRATE 0.05 MG/ML IJ SOLN
INTRAMUSCULAR | Status: AC
  Filled 2013-10-26: qty 2

## 2013-10-26 SURGICAL SUPPLY — 47 items
ADH SKN CLS APL DERMABOND .7 (GAUZE/BANDAGES/DRESSINGS) ×1
APL SKNCLS STERI-STRIP NONHPOA (GAUZE/BANDAGES/DRESSINGS)
BENZOIN TINCTURE PRP APPL 2/3 (GAUZE/BANDAGES/DRESSINGS) IMPLANT
BLADE 15 SAFETY STRL DISP (BLADE) ×3 IMPLANT
BLADE SURG ROTATE 9660 (MISCELLANEOUS) IMPLANT
CANISTER SUCT 1200ML W/VALVE (MISCELLANEOUS) IMPLANT
CLEANER CAUTERY TIP 5X5 PAD (MISCELLANEOUS) ×1 IMPLANT
CLOSURE WOUND 1/2 X4 (GAUZE/BANDAGES/DRESSINGS)
COVER MAYO STAND STRL (DRAPES) ×3 IMPLANT
COVER TABLE BACK 60X90 (DRAPES) ×3 IMPLANT
DECANTER SPIKE VIAL GLASS SM (MISCELLANEOUS) ×1 IMPLANT
DERMABOND ADVANCED (GAUZE/BANDAGES/DRESSINGS) ×2
DERMABOND ADVANCED .7 DNX12 (GAUZE/BANDAGES/DRESSINGS) IMPLANT
DRAPE PED LAPAROTOMY (DRAPES) ×2 IMPLANT
DRAPE U-SHAPE 76X120 STRL (DRAPES) IMPLANT
ELECT REM PT RETURN 9FT ADLT (ELECTROSURGICAL) ×3
ELECTRODE REM PT RTRN 9FT ADLT (ELECTROSURGICAL) ×1 IMPLANT
GLOVE BIO SURGEON STRL SZ8 (GLOVE) ×3 IMPLANT
GLOVE BIOGEL PI IND STRL 7.0 (GLOVE) IMPLANT
GLOVE BIOGEL PI INDICATOR 7.0 (GLOVE) ×2
GLOVE ECLIPSE 6.5 STRL STRAW (GLOVE) ×2 IMPLANT
GOWN STRL REUS W/ TWL LRG LVL3 (GOWN DISPOSABLE) ×1 IMPLANT
GOWN STRL REUS W/ TWL XL LVL3 (GOWN DISPOSABLE) ×1 IMPLANT
GOWN STRL REUS W/TWL LRG LVL3 (GOWN DISPOSABLE) ×3
GOWN STRL REUS W/TWL XL LVL3 (GOWN DISPOSABLE) ×3
NDL HYPO 25X1 1.5 SAFETY (NEEDLE) ×1 IMPLANT
NEEDLE 27GAX1X1/2 (NEEDLE) IMPLANT
NEEDLE HYPO 25X1 1.5 SAFETY (NEEDLE) ×3 IMPLANT
NS IRRIG 1000ML POUR BTL (IV SOLUTION) IMPLANT
PACK BASIN DAY SURGERY FS (CUSTOM PROCEDURE TRAY) ×3 IMPLANT
PAD CLEANER CAUTERY TIP 5X5 (MISCELLANEOUS) ×2
PENCIL BUTTON HOLSTER BLD 10FT (ELECTRODE) ×3 IMPLANT
SPONGE GAUZE 4X4 12PLY STER LF (GAUZE/BANDAGES/DRESSINGS) ×3 IMPLANT
STRIP CLOSURE SKIN 1/2X4 (GAUZE/BANDAGES/DRESSINGS) IMPLANT
SUT ETHILON 3 0 FSL (SUTURE) IMPLANT
SUT ETHILON 5 0 PS 2 18 (SUTURE) IMPLANT
SUT VIC AB 4-0 SH 18 (SUTURE) ×3 IMPLANT
SUT VIC AB 5-0 PS2 18 (SUTURE) IMPLANT
SUT VICRYL 3-0 CR8 SH (SUTURE) IMPLANT
SYR BULB 3OZ (MISCELLANEOUS) ×3 IMPLANT
SYR CONTROL 10ML LL (SYRINGE) ×3 IMPLANT
TOWEL OR 17X24 6PK STRL BLUE (TOWEL DISPOSABLE) ×3 IMPLANT
TRAY DSU PREP LF (CUSTOM PROCEDURE TRAY) ×3 IMPLANT
TUBE CONNECTING 20'X1/4 (TUBING)
TUBE CONNECTING 20X1/4 (TUBING) IMPLANT
UNDERPAD 30X30 INCONTINENT (UNDERPADS AND DIAPERS) IMPLANT
YANKAUER SUCT BULB TIP NO VENT (SUCTIONS) IMPLANT

## 2013-10-26 NOTE — Op Note (Signed)
Surgeon: Kaylyn Lim, MD, FACS  Asst:  none  Anes:  general  Procedure: Excision of lipoma from back3  Diagnosis: Lipoma on back  Complications: none  EBL:   minimal cc  Description of Procedure:  The patient was taken to OR 8 at [facility] and given general anesthesia and placed in bean bag on her side.  The previously marked area was prepped and draped and after a timeout, the incision was made.  This was carried down to the capsule of a lipoma that was just off of the midline.  A multilobulated lipoma was excised from the muscle fascia and sent to path.  Bleeding was controlled with electrocautery and the wound closed with 4-0 vicryl and Dermabond.  The patient was taken to PACU in stable condition.    Matt B. Hassell Done, [place], [facility], [place]

## 2013-10-26 NOTE — Interval H&P Note (Signed)
History and Physical Interval Note:  10/26/2013 1:18 PM  Kaitlin Watkins  has presented today for surgery, with the diagnosis of LIPOMA  The various methods of treatment have been discussed with the patient and family. After consideration of risks, benefits and other options for treatment, the patient has consented to  Procedure(s): EXCISION LIPOMA (N/A) as a surgical intervention .  The patient's history has been reviewed, patient examined, no change in status, stable for surgery.  I have reviewed the patient's chart and labs.  Questions were answered to the patient's satisfaction.     [name]

## 2013-10-26 NOTE — Transfer of Care (Signed)
Immediate Anesthesia Transfer of Care Note  Patient: Kaitlin Watkins  Procedure(s) Performed: Procedure(s): EXCISION LIPOMA OF MID BACK (N/A)  Patient Location: PACU  Anesthesia Type:General  Level of Consciousness: awake, alert , oriented and patient cooperative  Airway & Oxygen Therapy: Patient Spontanous Breathing and Patient connected to face mask oxygen  Post-op Assessment: Report given to PACU RN and Post -op Vital signs reviewed and stable  Post vital signs: Reviewed and stable  Complications: No apparent anesthesia complications

## 2013-10-26 NOTE — H&P (View-Only) (Signed)
Chief Complaint:  Mass on the right of the midline  History of Present Illness:  Kaitlin Watkins is an 68 y.o. female with a mass in the middle of her back referred by Dr. Allyn Kenner.  This feels like a lipoma of the mid back.  Would excise under general at [facility].  Procedure explained to patient.   Past Medical History  Diagnosis Date  . GERD (gastroesophageal reflux disease)   . Hyperlipidemia   . DVT (deep venous thrombosis) 2007    Left Leg  . Hypertension   . Knee pain, left anterior   . Tear of medial meniscus of right knee   . Arthritis   . PONV (postoperative nausea and vomiting)     Past Surgical History  Procedure Laterality Date  . Knee arthroscopy Left 1989  . Appendectomy  1975  . Carpal tunnel release Left 2009  . Cystostomy w/ bladder biopsy    . Refractive surgery Bilateral 2004  . Vaginal hysterectomy  1975    Pain  . Bilateral salpingectomy    . Leg surgery  2007    left leg DVT  . Abdominal hysterectomy  1975  . Skin cancer excision Left     foot  . Breast biopsy Left   . Tonsillectomy      [employer]   . Knee arthroscopy with lateral menisectomy Right 04/19/2013    Procedure: ARTHROSCOPY RITH KNEE BILATERAL PARTIAL MENISECTOMY ;  Surgeon: Johnn Hai, MD;  Location: [facility];  Service: Orthopedics;  Laterality: Right;    Current Outpatient Prescriptions  Medication Sig Dispense Refill  . atorvastatin (LIPITOR) 80 MG tablet Take 80 mg by mouth every evening.      . Coenzyme Q10 (CO Q-10) 200 MG CAPS Take 200 mg by mouth daily.      Marland Kitchen losartan-hydrochlorothiazide (HYZAAR) 50-12.5 MG per tablet Take 1 tablet by mouth every morning.      . Ascorbic Acid (VITAMIN C) 1000 MG tablet Take 1,000 mg by mouth daily.      Marland Kitchen b complex vitamins tablet Take 1 tablet by mouth daily.      Marland Kitchen BIOTIN 5000 PO Take by mouth.      . docusate sodium (COLACE) 100 MG capsule Take 400 mg by mouth daily at 12 noon.       Marland Kitchen esomeprazole (NEXIUM) 20 MG capsule Take 40 mg by  mouth daily at 12 noon.      . furosemide (LASIX) 40 MG tablet Take 1/2 - 1 tab daily for increased fluid  90 tablet  3  . HYDROcodone-acetaminophen (NORCO) 7.5-325 MG per tablet Take 1-2 tablets by mouth every 4 (four) hours as needed for pain.  40 tablet  0  . Misc Natural Products (LUTEIN 20 PO) Take by mouth.      . Misc Natural Products (OSTEO BI-FLEX JOINT SHIELD PO) Take by mouth.      . Phendimetrazine Tartrate 35 MG TABS Take 1 tablet (35 mg total) by mouth 3 (three) times daily before meals.  90 each  0  . phentermine (ADIPEX-P) 37.5 MG tablet Take 1 tablet (37.5 mg total) by mouth daily before breakfast.  30 tablet  0   No current facility-administered medications for this visit.   Eggs or egg-derived products Family History  Problem Relation Age of Onset  . Heart disease Mother     CHF  . Stroke Mother 71  . Cancer Mother 32    Cerivcal Cancer  . Stroke  Father 14  . Alcohol abuse Father   . Heart disease Sister 73    MI  . Heart disease Brother   . Stroke Brother   . Heart disease Brother 7    MI  . Cancer Brother 39    Prostate   Social History:   reports that she quit smoking about 49 years ago. Her smoking use included Cigarettes. She has a 10 pack-year smoking history. She has never used smokeless tobacco. She reports that she drinks alcohol. She reports that she does not use illicit drugs.   REVIEW OF SYSTEMS - PERTINENT POSITIVES ONLY: Neg for DVT-just getting over gi bug  Physical Exam:   Blood pressure 110/70, pulse 88, temperature 98.5 F (36.9 C), temperature source Oral, resp. rate 12, height 5\' 6"  (1.676 m), weight 197 lb 9.6 oz (89.631 kg). Body mass index is 31.91 kg/(m^2).  Gen:  WDWN WF NAD  Neurological: Alert and oriented to person, place, and time. Motor and sensory function is grossly intact  Head: Normocephalic and atraumatic.  Eyes: Conjunctivae are normal. Pupils are equal, round, and reactive to light. No scleral icterus.  Neck: Normal  range of motion. Neck supple. No tracheal deviation or thyromegaly present.  Cardiovascular:  SR without murmurs or gallops.  No carotid bruits Respiratory: Effort normal.  No respiratory distress. No chest wall tenderness. Breath sounds normal.  No wheezes, rales or rhonchi.  Back:  3 cm soft mobile mass to the right of the midline about the mid back adjacent the scapula.   Abdomen:  nontender GU: Musculoskeletal: Normal range of motion. Extremities are nontender. No cyanosis, edema or clubbing noted Lymphadenopathy: No cervical, preauricular, postauricular or axillary adenopathy is present Skin: Skin is warm and dry. No rash noted. No diaphoresis. No erythema. No pallor. Pscyh: Normal mood and affect. Behavior is normal. Judgment and thought content normal.   LABORATORY RESULTS: No results found for this or any previous visit (from the past 48 hour(s)).  RADIOLOGY RESULTS: No results found.  Problem List: Patient Active Problem List   Diagnosis Date Noted  . Other malaise and fatigue 06/10/2013  . Unspecified vitamin D deficiency 06/10/2013  . 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 Knee pain, acute 06/10/2013  . 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 Tear of medial meniscus of right knee 04/19/2013  . Other and unspecified hyperlipidemia 04/07/2013  . Essential hypertension, benign 03/28/2013  . Obesity, unspecified 03/28/2013  . Medial meniscus tear 03/28/2013  . GERD (gastroesophageal reflux disease)     Assessment & Plan: Mid back lipoma-for excision under general    Matt B. Hassell Done, MD, [facility] [phone] beeper [phone]  09/27/2013 12:05 PM

## 2013-10-26 NOTE — Anesthesia Postprocedure Evaluation (Signed)
  Anesthesia Post-op Note  Patient: Kaitlin Watkins  Procedure(s) Performed: Procedure(s): EXCISION LIPOMA OF MID BACK (N/A)  Patient Location: PACU  Anesthesia Type:General  Level of Consciousness: awake  Airway and Oxygen Therapy: Patient Spontanous Breathing  Post-op Pain: mild  Post-op Assessment: Post-op Vital signs reviewed, Patient's Cardiovascular Status Stable, Respiratory Function Stable, Patent Airway, No signs of Nausea or vomiting and Pain level controlled  Post-op Vital Signs: Reviewed and stable  Last Vitals:  Filed Vitals:   10/26/13 1500  BP: 120/87  Pulse: 84  Temp:   Resp: 16    Complications: No apparent anesthesia complications

## 2013-10-26 NOTE — Anesthesia Procedure Notes (Signed)
Procedure Name: Intubation Date/Time: 10/26/2013 1:49 PM Performed by: Hadas Jessop Pre-anesthesia Checklist: Patient identified, Emergency Drugs available, Suction available and Patient being monitored Patient Re-evaluated:Patient Re-evaluated prior to inductionOxygen Delivery Method: Circle System Utilized Preoxygenation: Pre-oxygenation with 100% oxygen Intubation Type: IV induction Ventilation: Mask ventilation without difficulty Laryngoscope Size: Mac and 3 Grade View: Grade I Tube type: Oral Tube size: 7.0 mm Number of attempts: 1 Airway Equipment and Method: stylet and oral airway Placement Confirmation: ETT inserted through vocal cords under direct vision,  positive ETCO2 and breath sounds checked- equal and bilateral Secured at: 21 cm Tube secured with: Tape Dental Injury: Teeth and Oropharynx as per pre-operative assessment

## 2013-10-26 NOTE — Anesthesia Preprocedure Evaluation (Signed)
Anesthesia Evaluation  Patient identified by MRN, date of birth, ID band Patient awake    Reviewed: Allergy & Precautions, H&P , Patient's Chart, lab work & pertinent test results, reviewed documented beta blocker date and time   History of Anesthesia Complications Negative for: history of anesthetic complications  Airway Mallampati: II TM Distance: >3 FB Neck ROM: full    Dental   Pulmonary former smoker,  breath sounds clear to auscultation        Cardiovascular Exercise Tolerance: Good hypertension, Rhythm:regular Rate:Normal     Neuro/Psych negative psych ROS   GI/Hepatic GERD-  Controlled,  Endo/Other    Renal/GU      Musculoskeletal   Abdominal   Peds  Hematology   Anesthesia Other Findings   Reproductive/Obstetrics                           Anesthesia Physical Anesthesia Plan  ASA: II  Anesthesia Plan: General ETT   Post-op Pain Management:    Induction:   Airway Management Planned:   Additional Equipment:   Intra-op Plan:   Post-operative Plan:   Informed Consent: I have reviewed the patients History and Physical, chart, labs and discussed the procedure including the risks, benefits and alternatives for the proposed anesthesia with the patient or authorized representative who has indicated his/her understanding and acceptance.   Dental Advisory Given  Plan Discussed with: CRNA, Surgeon and Anesthesiologist  Anesthesia Plan Comments:         Anesthesia Quick Evaluation

## 2013-10-26 NOTE — Discharge Instructions (Signed)
Lipoma °A lipoma is a noncancerous (benign) tumor composed of fat cells. They are usually found under the skin (subcutaneous). A lipoma may occur in any tissue of the body that contains fat. Common areas for lipomas to appear include the back, shoulders, buttocks, and thighs. Lipomas are a very common soft tissue growth. They are soft and grow slowly. Most problems caused by a lipoma depend on where it is growing. °DIAGNOSIS  °A lipoma can be diagnosed with a physical exam. These tumors rarely become cancerous, but radiographic studies can help determine this for certain. Studies used may include: °· Computerized X-ray scans (CT or CAT scan). °· Computerized magnetic scans (MRI). °TREATMENT  °Small lipomas that are not causing problems may be watched. If a lipoma continues to enlarge or causes problems, removal is often the best treatment. Lipomas can also be removed to improve appearance. Surgery is done to remove the fatty cells and the surrounding capsule. Most often, this is done with medicine that numbs the area (local anesthetic). The removed tissue is examined under a microscope to make sure it is not cancerous. Keep all follow-up appointments with your caregiver. °SEEK MEDICAL CARE IF:  °· The lipoma becomes larger or hard. °· The lipoma becomes painful, red, or increasingly swollen. These could be signs of infection or a more serious condition. °Document Released: 06/11/2002 Document Revised: 09/13/2011 Document Reviewed: 11/21/2009 °ExitCare® Patient Information ©2014 [employer]. ° ° °Post Anesthesia Home Care Instructions ° °Activity: °Get plenty of rest for the remainder of the day. A responsible adult should stay with you for 24 hours following the procedure.  °For the next 24 hours, DO NOT: °-Drive a car °-Operate machinery °-Drink alcoholic beverages °-Take any medication unless instructed by your physician °-Make any legal decisions or sign important papers. ° °Meals: °Start with liquid foods such  as gelatin or soup. Progress to regular foods as tolerated. Avoid greasy, spicy, heavy foods. If nausea and/or vomiting occur, drink only clear liquids until the nausea and/or vomiting subsides. Call your physician if vomiting continues. ° °Special Instructions/Symptoms: °Your throat may feel dry or sore from the anesthesia or the breathing tube placed in your throat during surgery. If this causes discomfort, gargle with warm salt water. The discomfort should disappear within 24 hours. ° °

## 2013-10-29 ENCOUNTER — Other Ambulatory Visit: Payer: Self-pay | Admitting: *Deleted

## 2013-10-29 ENCOUNTER — Encounter (HOSPITAL_BASED_OUTPATIENT_CLINIC_OR_DEPARTMENT_OTHER): Payer: Self-pay | Admitting: Surgery

## 2013-10-29 DIAGNOSIS — E785 Hyperlipidemia, unspecified: Secondary | ICD-10-CM

## 2013-10-29 DIAGNOSIS — R5381 Other malaise: Secondary | ICD-10-CM

## 2013-10-29 DIAGNOSIS — R7301 Impaired fasting glucose: Secondary | ICD-10-CM

## 2013-10-29 DIAGNOSIS — R5383 Other fatigue: Secondary | ICD-10-CM

## 2013-10-30 ENCOUNTER — Ambulatory Visit (INDEPENDENT_AMBULATORY_CARE_PROVIDER_SITE_OTHER): Payer: Managed Care, Other (non HMO) | Admitting: Oncology

## 2013-10-30 ENCOUNTER — Encounter: Payer: Self-pay | Admitting: Oncology

## 2013-10-30 VITALS — BP 125/79 | HR 80 | Temp 97.1°F | Wt 197.8 lb

## 2013-10-30 DIAGNOSIS — Z86718 Personal history of other venous thrombosis and embolism: Secondary | ICD-10-CM

## 2013-10-30 HISTORY — DX: Personal history of other venous thrombosis and embolism: Z86.718

## 2013-10-30 LAB — ANTITHROMBIN III: AntiThromb III Func: 97 % (ref 76–126)

## 2013-10-30 NOTE — Patient Instructions (Addendum)
To lab today I recommend Xarelto 10 mg 1 tablet start 12-24 hours after surgery and continue one daily for 3 weeks then change to aspirin 81 mg 1 daily to take on a regular basis in view of you strong family history of heart problems Return to Dr Beryle Beams on as needed basis
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
## 2013-10-31 LAB — LUPUS ANTICOAGULANT PANEL
DRVVT: 30.4 secs (ref <42.9)
Lupus Anticoagulant: NOT DETECTED
PTT LA: 37.3 s (ref 28.0–43.0)

## 2013-10-31 LAB — CARDIOLIPIN ANTIBODIES, IGG, IGM, IGA
ANTICARDIOLIPIN IGA: 8 U/mL (ref <22)
Anticardiolipin IgG: 4 GPL U/mL (ref <23)
Anticardiolipin IgM: 2 MPL U/mL (ref <11)

## 2013-10-31 LAB — BETA-2 GLYCOPROTEIN ANTIBODIES
Beta-2 Glyco I IgG: 12 G Units (ref <20)
Beta-2-Glycoprotein I IgA: 7 A Units (ref <20)
Beta-2-Glycoprotein I IgM: 6 M Units (ref <20)

## 2013-11-05 ENCOUNTER — Other Ambulatory Visit: Payer: Commercial Indemnity

## 2013-11-06 LAB — COMPLETE METABOLIC PANEL WITH GFR
ALT: 15 U/L (ref 0–35)
AST: 16 U/L (ref 0–37)
Albumin: 3.6 g/dL (ref 3.5–5.2)
Alkaline Phosphatase: 88 U/L (ref 39–117)
BUN: 16 mg/dL (ref 6–23)
CO2: 29 mEq/L (ref 19–32)
Calcium: 9 mg/dL (ref 8.4–10.5)
Chloride: 104 mEq/L (ref 96–112)
Creat: 0.81 mg/dL (ref 0.50–1.10)
GFR, Est African American: 86 mL/min
GFR, Est Non African American: 74 mL/min
Glucose, Bld: 87 mg/dL (ref 70–99)
Potassium: 4.1 mEq/L (ref 3.5–5.3)
Sodium: 140 mEq/L (ref 135–145)
Total Bilirubin: 0.6 mg/dL (ref 0.2–1.2)
Total Protein: 6.1 g/dL (ref 6.0–8.3)

## 2013-11-06 LAB — CBC WITH DIFFERENTIAL/PLATELET
Basophils Absolute: 0 10*3/uL (ref 0.0–0.1)
Basophils Relative: 1 % (ref 0–1)
Eosinophils Absolute: 0.1 10*3/uL (ref 0.0–0.7)
Eosinophils Relative: 3 % (ref 0–5)
HCT: 39.3 % (ref 36.0–46.0)
Hemoglobin: 13.3 g/dL (ref 12.0–15.0)
Lymphocytes Relative: 27 % (ref 12–46)
Lymphs Abs: 1.3 10*3/uL (ref 0.7–4.0)
MCH: 29.2 pg (ref 26.0–34.0)
MCHC: 33.8 g/dL (ref 30.0–36.0)
MCV: 86.4 fL (ref 78.0–100.0)
Monocytes Absolute: 0.6 10*3/uL (ref 0.1–1.0)
Monocytes Relative: 12 % (ref 3–12)
Neutro Abs: 2.7 10*3/uL (ref 1.7–7.7)
Neutrophils Relative %: 57 % (ref 43–77)
Platelets: 201 10*3/uL (ref 150–400)
RBC: 4.55 MIL/uL (ref 3.87–5.11)
RDW: 13.9 % (ref 11.5–15.5)
WBC: 4.7 10*3/uL (ref 4.0–10.5)

## 2013-11-06 LAB — LIPID PANEL
Cholesterol: 207 mg/dL — ABNORMAL HIGH (ref 0–200)
HDL: 41 mg/dL (ref >39)
LDL Cholesterol: 127 mg/dL — ABNORMAL HIGH (ref 0–99)
Total CHOL/HDL Ratio: 5 Ratio
Triglycerides: 193 mg/dL — ABNORMAL HIGH (ref <150)
VLDL: 39 mg/dL (ref 0–40)

## 2013-11-06 LAB — HEMOGLOBIN A1C
Hgb A1c MFr Bld: 5.7 % — ABNORMAL HIGH (ref <5.7)
Mean Plasma Glucose: 117 mg/dL — ABNORMAL HIGH (ref <117)

## 2013-11-07 ENCOUNTER — Other Ambulatory Visit: Payer: Self-pay | Admitting: Oncology

## 2013-11-07 LAB — MICROALBUMIN, URINE: Microalb, Ur: 0.5 mg/dL (ref 0.00–1.89)

## 2013-11-09 ENCOUNTER — Encounter (INDEPENDENT_AMBULATORY_CARE_PROVIDER_SITE_OTHER): Payer: Commercial Indemnity | Admitting: Surgery

## 2013-11-12 ENCOUNTER — Ambulatory Visit (INDEPENDENT_AMBULATORY_CARE_PROVIDER_SITE_OTHER): Payer: Medicare Other | Admitting: Family Medicine

## 2013-11-12 ENCOUNTER — Encounter: Payer: Self-pay | Admitting: Family Medicine

## 2013-11-12 VITALS — BP 113/73 | HR 81 | Resp 16 | Ht 64.0 in | Wt 195.0 lb

## 2013-11-12 DIAGNOSIS — R5381 Other malaise: Secondary | ICD-10-CM

## 2013-11-12 DIAGNOSIS — I10 Essential (primary) hypertension: Secondary | ICD-10-CM

## 2013-11-12 DIAGNOSIS — Z01818 Encounter for other preprocedural examination: Secondary | ICD-10-CM

## 2013-11-12 DIAGNOSIS — R5383 Other fatigue: Secondary | ICD-10-CM

## 2013-11-12 DIAGNOSIS — Z0000 Encounter for general adult medical examination without abnormal findings: Secondary | ICD-10-CM

## 2013-11-12 DIAGNOSIS — M25569 Pain in unspecified knee: Secondary | ICD-10-CM

## 2013-11-12 LAB — EKG 12-LEAD
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
## 2013-11-12 NOTE — Patient Instructions (Signed)
1)  Preventative Issues - You need your mammogram, bone density, stool cards and eye exam.  You will get the second pneumonia shot in October (6 months after the Prevnar 13).   Preventive Care for Adults, Female A healthy lifestyle and preventive care can promote health and wellness. Preventive health guidelines for women include the following key practices.  A routine yearly physical is a good way to check with your health care provider about your health and preventive screening. It is a chance to share any concerns and updates on your health and to receive a thorough exam.  Visit your dentist for a routine exam and preventive care every 6 months. Brush your teeth twice a day and floss once a day. Good oral hygiene prevents tooth decay and gum disease.  The frequency of eye exams is based on your age, health, family medical history, use of contact lenses, and other factors. Follow your health care provider's recommendations for frequency of eye exams.  Eat a healthy diet. Foods like vegetables, fruits, whole grains, low-fat dairy products, and lean protein foods contain the nutrients you need without too many calories. Decrease your intake of foods high in solid fats, added sugars, and salt. Eat the right amount of calories for you.Get information about a proper diet from your health care provider, if necessary.  Regular physical exercise is one of the most important things you can do for your health. Most adults should get at least 150 minutes of moderate-intensity exercise (any activity that increases your heart rate and causes you to sweat) each week. In addition, most adults need muscle-strengthening exercises on 2 or more days a week.  Maintain a healthy weight. The body mass index (BMI) is a screening tool to identify possible weight problems. It provides an estimate of body fat based on height and weight. Your health care provider can find your BMI, and can help you achieve or maintain a  healthy weight.For adults 20 years and older:  A BMI below 18.5 is considered underweight.  A BMI of 18.5 to 24.9 is normal.  A BMI of 25 to 29.9 is considered overweight.  A BMI of 30 and above is considered obese.  Maintain normal blood lipids and cholesterol levels by exercising and minimizing your intake of saturated fat. Eat a balanced diet with plenty of fruit and vegetables. Blood tests for lipids and cholesterol should begin at age 75 and be repeated every 5 years. If your lipid or cholesterol levels are high, you are over 50, or you are at high risk for heart disease, you may need your cholesterol levels checked more frequently.Ongoing high lipid and cholesterol levels should be treated with medicines if diet and exercise are not working.  If you smoke, find out from your health care provider how to quit. If you do not use tobacco, do not start.  Lung cancer screening is recommended for adults aged 63 80 years who are at high risk for developing lung cancer because of a history of smoking. A yearly low-dose CT scan of the lungs is recommended for people who have at least a 30-pack-year history of smoking and are a current smoker or have quit within the past 15 years. A pack year of smoking is smoking an average of 1 pack of cigarettes a day for 1 year (for example: 1 pack a day for 30 years or 2 packs a day for 15 years). Yearly screening should continue until the smoker has stopped smoking for at least  15 years. Yearly screening should be stopped for people who develop a health problem that would prevent them from having lung cancer treatment.  If you are pregnant, do not drink alcohol. If you are breastfeeding, be very cautious about drinking alcohol. If you are not pregnant and choose to drink alcohol, do not have more than 1 drink per day. One drink is considered to be 12 ounces (355 mL) of beer, 5 ounces (148 mL) of wine, or 1.5 ounces (44 mL) of liquor.  Avoid use of street drugs.  Do not share needles with anyone. Ask for help if you need support or instructions about stopping the use of drugs.  High blood pressure causes heart disease and increases the risk of stroke. Your blood pressure should be checked at least every 1 to 2 years. Ongoing high blood pressure should be treated with medicines if weight loss and exercise do not work.  If you are 74 70 years old, ask your health care provider if you should take aspirin to prevent strokes.  Diabetes screening involves taking a blood sample to check your fasting blood sugar level. This should be done once every 3 years, after age 24, if you are within normal weight and without risk factors for diabetes. Testing should be considered at a younger age or be carried out more frequently if you are overweight and have at least 1 risk factor for diabetes.  Breast cancer screening is essential preventive care for women. You should practice "breast self-awareness." This means understanding the normal appearance and feel of your breasts and may include breast self-examination. Any changes detected, no matter how small, should be reported to a health care provider. Women in their 51s and 30s should have a clinical breast exam (CBE) by a health care provider as part of a regular health exam every 1 to 3 years. After age 61, women should have a CBE every year. Starting at age 53, women should consider having a mammogram (breast X-ray test) every year. Women who have a family history of breast cancer should talk to their health care provider about genetic screening. Women at a high risk of breast cancer should talk to their health care providers about having an MRI and a mammogram every year.  Breast cancer gene (BRCA)-related cancer risk assessment is recommended for women who have family members with BRCA-related cancers. BRCA-related cancers include breast, ovarian, tubal, and peritoneal cancers. Having family members with these cancers may be  associated with an increased risk for harmful changes (mutations) in the breast cancer genes BRCA1 and BRCA2. Results of the assessment will determine the need for genetic counseling and BRCA1 and BRCA2 testing.  The Pap test is a screening test for cervical cancer. A Pap test can show cell changes on the cervix that might become cervical cancer if left untreated. A Pap test is a procedure in which cells are obtained and examined from the lower end of the uterus (cervix).  Women should have a Pap test starting at age 33.  Between ages 80 and 21, Pap tests should be repeated every 2 years.  Beginning at age 97, you should have a Pap test every 3 years as long as the past 3 Pap tests have been normal.  Some women have medical problems that increase the chance of getting cervical cancer. Talk to your health care provider about these problems. It is especially important to talk to your health care provider if a new problem develops soon after your last  Pap test. In these cases, your health care provider may recommend more frequent screening and Pap tests.  The above recommendations are the same for women who have or have not gotten the vaccine for human papillomavirus (HPV).  If you had a hysterectomy for a problem that was not cancer or a condition that could lead to cancer, then you no longer need Pap tests. Even if you no longer need a Pap test, a regular exam is a good idea to make sure no other problems are starting.  If you are between ages 75 and 36 years, and you have had normal Pap tests going back 10 years, you no longer need Pap tests. Even if you no longer need a Pap test, a regular exam is a good idea to make sure no other problems are starting.  If you have had past treatment for cervical cancer or a condition that could lead to cancer, you need Pap tests and screening for cancer for at least 20 years after your treatment.  If Pap tests have been discontinued, risk factors (such as a  new sexual partner) need to be reassessed to determine if screening should be resumed.  The HPV test is an additional test that may be used for cervical cancer screening. The HPV test looks for the virus that can cause the cell changes on the cervix. The cells collected during the Pap test can be tested for HPV. The HPV test could be used to screen women aged 22 years and older, and should be used in women of any age who have unclear Pap test results. After the age of 15, women should have HPV testing at the same frequency as a Pap test.  Colorectal cancer can be detected and often prevented. Most routine colorectal cancer screening begins at the age of 67 years and continues through age 75 years. However, your health care provider may recommend screening at an earlier age if you have risk factors for colon cancer. On a yearly basis, your health care provider may provide home test kits to check for hidden blood in the stool. Use of a small camera at the end of a tube, to directly examine the colon (sigmoidoscopy or colonoscopy), can detect the earliest forms of colorectal cancer. Talk to your health care provider about this at age 53, when routine screening begins. Direct exam of the colon should be repeated every 5 10 years through age 1 years, unless early forms of pre-cancerous polyps or small growths are found.  People who are at an increased risk for hepatitis B should be screened for this virus. You are considered at high risk for hepatitis B if:  You were born in a country where hepatitis B occurs often. Talk with your health care provider about which countries are considered high risk.  Your parents were born in a high-risk country and you have not received a shot to protect against hepatitis B (hepatitis B vaccine).  You have HIV or AIDS.  You use needles to inject street drugs.  You live with, or have sex with, someone who has Hepatitis B.  You get hemodialysis treatment.  You take  certain medicines for conditions like cancer, organ transplantation, and autoimmune conditions.  Hepatitis C blood testing is recommended for all people born from [birth year] through 1965 and any individual with known risks for hepatitis C.  Practice safe sex. Use condoms and avoid high-risk sexual practices to reduce the spread of sexually transmitted infections (STIs). STIs include gonorrhea,  chlamydia, syphilis, trichomonas, herpes, HPV, and human immunodeficiency virus (HIV). Herpes, HIV, and HPV are viral illnesses that have no cure. They can result in disability, cancer, and death. Sexually active women aged 32 years and younger should be checked for chlamydia. Older women with new or multiple partners should also be tested for chlamydia. Testing for other STIs is recommended if you are sexually active and at increased risk.  Osteoporosis is a disease in which the bones lose minerals and strength with aging. This can result in serious bone fractures or breaks. The risk of osteoporosis can be identified using a bone density scan. Women ages 77 years and over and women at risk for fractures or osteoporosis should discuss screening with their health care providers. Ask your health care provider whether you should take a calcium supplement or vitamin D to reduce the rate of osteoporosis.  Menopause can be associated with physical symptoms and risks. Hormone replacement therapy is available to decrease symptoms and risks. You should talk to your health care provider about whether hormone replacement therapy is right for you.  Use sunscreen. Apply sunscreen liberally and repeatedly throughout the day. You should seek shade when your shadow is shorter than you. Protect yourself by wearing long sleeves, pants, a wide-brimmed hat, and sunglasses year round, whenever you are outdoors.  Once a month, do a whole body skin exam, using a mirror to look at the skin on your back. Tell your health care provider of new  moles, moles that have irregular borders, moles that are larger than a pencil eraser, or moles that have changed in shape or color.  Stay current with required vaccines (immunizations).  Influenza vaccine. All adults should be immunized every year.  Tetanus, diphtheria, and acellular pertussis (Td, Tdap) vaccine. Pregnant women should receive 1 dose of Tdap vaccine during each pregnancy. The dose should be obtained regardless of the length of time since the last dose. Immunization is preferred during the 27th 36th week of gestation. An adult who has not previously received Tdap or who does not know her vaccine status should receive 1 dose of Tdap. This initial dose should be followed by tetanus and diphtheria toxoids (Td) booster doses every 10 years. Adults with an unknown or incomplete history of completing a 3-dose immunization series with Td-containing vaccines should begin or complete a primary immunization series including a Tdap dose. Adults should receive a Td booster every 10 years.  Varicella vaccine. An adult without evidence of immunity to varicella should receive 2 doses or a second dose if she has previously received 1 dose. Pregnant females who do not have evidence of immunity should receive the first dose after pregnancy. This first dose should be obtained before leaving the health care facility. The second dose should be obtained 4 8 weeks after the first dose.  Human papillomavirus (HPV) vaccine. Females aged 69 26 years who have not received the vaccine previously should obtain the 3-dose series. The vaccine is not recommended for use in pregnant females. However, pregnancy testing is not needed before receiving a dose. If a female is found to be pregnant after receiving a dose, no treatment is needed. In that case, the remaining doses should be delayed until after the pregnancy. Immunization is recommended for any person with an immunocompromised condition through the age of 26 years if  she did not get any or all doses earlier. During the 3-dose series, the second dose should be obtained 4 8 weeks after the first dose. The  third dose should be obtained 24 weeks after the first dose and 16 weeks after the second dose.  Zoster vaccine. One dose is recommended for adults aged 60 years or older unless certain conditions are present.  Measles, mumps, and rubella (MMR) vaccine. Adults born before [birth year] generally are considered immune to measles and mumps. Adults born in [birth year] or later should have 1 or more doses of MMR vaccine unless there is a contraindication to the vaccine or there is laboratory evidence of immunity to each of the three diseases. A routine second dose of MMR vaccine should be obtained at least 28 days after the first dose for students attending postsecondary schools, health care workers, or international travelers. People who received inactivated measles vaccine or an unknown type of measles vaccine during 1963 1967 should receive 2 doses of MMR vaccine. People who received inactivated mumps vaccine or an unknown type of mumps vaccine before 1979 and are at high risk for mumps infection should consider immunization with 2 doses of MMR vaccine. For females of childbearing age, rubella immunity should be determined. If there is no evidence of immunity, females who are not pregnant should be vaccinated. If there is no evidence of immunity, females who are pregnant should delay immunization until after pregnancy. Unvaccinated health care workers born before [birth year] who lack laboratory evidence of measles, mumps, or rubella immunity or laboratory confirmation of disease should consider measles and mumps immunization with 2 doses of MMR vaccine or rubella immunization with 1 dose of MMR vaccine.  Pneumococcal 13-valent conjugate (PCV13) vaccine. When indicated, a person who is uncertain of her immunization history and has no record of immunization should receive the PCV13 vaccine. An adult  aged 31 years or older who has certain medical conditions and has not been previously immunized should receive 1 dose of PCV13 vaccine. This PCV13 should be followed with a dose of pneumococcal polysaccharide (PPSV23) vaccine. The PPSV23 vaccine dose should be obtained at least 8 weeks after the dose of PCV13 vaccine. An adult aged 5 years or older who has certain medical conditions and previously received 1 or more doses of PPSV23 vaccine should receive 1 dose of PCV13. The PCV13 vaccine dose should be obtained 1 or more years after the last PPSV23 vaccine dose.  Pneumococcal polysaccharide (PPSV23) vaccine. When PCV13 is also indicated, PCV13 should be obtained first. All adults aged 55 years and older should be immunized. An adult younger than age 84 years who has certain medical conditions should be immunized. Any person who resides in a nursing home or long-term care facility should be immunized. An adult smoker should be immunized. People with an immunocompromised condition and certain other conditions should receive both PCV13 and PPSV23 vaccines. People with human immunodeficiency virus (HIV) infection should be immunized as soon as possible after diagnosis. Immunization during chemotherapy or radiation therapy should be avoided. Routine use of PPSV23 vaccine is not recommended for American Indians, [place]atives, or people younger than 65 years unless there are medical conditions that require PPSV23 vaccine. When indicated, people who have unknown immunization and have no record of immunization should receive PPSV23 vaccine. One-time revaccination 5 years after the first dose of PPSV23 is recommended for people aged 75 64 years who have chronic kidney failure, nephrotic syndrome, asplenia, or immunocompromised conditions. People who received 1 2 doses of PPSV23 before age 68 years should receive another dose of PPSV23 vaccine at age 53 years or later if at least 5 years have passed since the  previous  dose. Doses of PPSV23 are not needed for people immunized with PPSV23 at or after age 37 years.  Meningococcal vaccine. Adults with asplenia or persistent complement component deficiencies should receive 2 doses of quadrivalent meningococcal conjugate (MenACWY-D) vaccine. The doses should be obtained at least 2 months apart. Microbiologists working with certain meningococcal bacteria, [employer] recruits, people at risk during an outbreak, and people who travel to or live in countries with a high rate of meningitis should be immunized. A first-year college student up through age 54 years who is living in a residence hall should receive a dose if she did not receive a dose on or after her 16th birthday. Adults who have certain high-risk conditions should receive one or more doses of vaccine.  Hepatitis A vaccine. Adults who wish to be protected from this disease, have certain high-risk conditions, work with hepatitis A-infected animals, work in hepatitis A research labs, or travel to or work in countries with a high rate of hepatitis A should be immunized. Adults who were previously unvaccinated and who anticipate close contact with an international adoptee during the first 60 days after arrival in the [place] from a country with a high rate of hepatitis A should be immunized.  Hepatitis B vaccine. Adults who wish to be protected from this disease, have certain high-risk conditions, may be exposed to blood or other infectious body fluids, are household contacts or sex partners of hepatitis B positive people, are clients or workers in certain care facilities, or travel to or work in countries with a high rate of hepatitis B should be immunized.  Haemophilus influenzae type b (Hib) vaccine. A previously unvaccinated person with asplenia or sickle cell disease or having a scheduled splenectomy should receive 1 dose of Hib vaccine. Regardless of previous immunization, a recipient of a hematopoietic stem cell  transplant should receive a 3-dose series 6 12 months after her successful transplant. Hib vaccine is not recommended for adults with HIV infection. Preventive Services / Frequency Ages 35 to 39years  Blood pressure check.** / Every 1 to 2 years.  Lipid and cholesterol check.** / Every 5 years beginning at age 34.  Clinical breast exam.** / Every 3 years for women in their 26s and 36s.  BRCA-related cancer risk assessment.** / For women who have family members with a BRCA-related cancer (breast, ovarian, tubal, or peritoneal cancers).  Pap test.** / Every 2 years from ages 34 through 70. Every 3 years starting at age 57 through age 36 or 78 with a history of 3 consecutive normal Pap tests.  HPV screening.** / Every 3 years from ages 31 through ages 5 to 80 with a history of 3 consecutive normal Pap tests.  Hepatitis C blood test.** / For any individual with known risks for hepatitis C.  Skin self-exam. / Monthly.  Influenza vaccine. / Every year.  Tetanus, diphtheria, and acellular pertussis (Tdap, Td) vaccine.** / Consult your health care provider. Pregnant women should receive 1 dose of Tdap vaccine during each pregnancy. 1 dose of Td every 10 years.  Varicella vaccine.** / Consult your health care provider. Pregnant females who do not have evidence of immunity should receive the first dose after pregnancy.  HPV vaccine. / 3 doses over 6 months, if 37 and younger. The vaccine is not recommended for use in pregnant females. However, pregnancy testing is not needed before receiving a dose.  Measles, mumps, rubella (MMR) vaccine.** / You need at least 1 dose of MMR if you  were born in [birth year] or later. You may also need a 2nd dose. For females of childbearing age, rubella immunity should be determined. If there is no evidence of immunity, females who are not pregnant should be vaccinated. If there is no evidence of immunity, females who are pregnant should delay immunization until after  pregnancy.  Pneumococcal 13-valent conjugate (PCV13) vaccine.** / Consult your health care provider.  Pneumococcal polysaccharide (PPSV23) vaccine.** / 1 to 2 doses if you smoke cigarettes or if you have certain conditions.  Meningococcal vaccine.** / 1 dose if you are age 59 to 15 years and a Market researcher living in a residence hall, or have one of several medical conditions, you need to get vaccinated against meningococcal disease. You may also need additional booster doses.  Hepatitis A vaccine.** / Consult your health care provider.  Hepatitis B vaccine.** / Consult your health care provider.  Haemophilus influenzae type b (Hib) vaccine.** / Consult your health care provider. Ages 81 to 64years  Blood pressure check.** / Every 1 to 2 years.  Lipid and cholesterol check.** / Every 5 years beginning at age 72 years.  Lung cancer screening. / Every year if you are aged 7 80 years and have a 30-pack-year history of smoking and currently smoke or have quit within the past 15 years. Yearly screening is stopped once you have quit smoking for at least 15 years or develop a health problem that would prevent you from having lung cancer treatment.  Clinical breast exam.** / Every year after age 11 years.  BRCA-related cancer risk assessment.** / For women who have family members with a BRCA-related cancer (breast, ovarian, tubal, or peritoneal cancers).  Mammogram.** / Every year beginning at age 24 years and continuing for as long as you are in good health. Consult with your health care provider.  Pap test.** / Every 3 years starting at age 86 years through age 74 or 52 years with a history of 3 consecutive normal Pap tests.  HPV screening.** / Every 3 years from ages 43 years through ages 32 to 56 years with a history of 3 consecutive normal Pap tests.  Fecal occult blood test (FOBT) of stool. / Every year beginning at age 28 years and continuing until age 11 years. You may  not need to do this test if you get a colonoscopy every 10 years.  Flexible sigmoidoscopy or colonoscopy.** / Every 5 years for a flexible sigmoidoscopy or every 10 years for a colonoscopy beginning at age 39 years and continuing until age 29 years.  Hepatitis C blood test.** / For all people born from [birth year] through 1967 and any individual with known risks for hepatitis C.  Skin self-exam. / Monthly.  Influenza vaccine. / Every year.  Tetanus, diphtheria, and acellular pertussis (Tdap/Td) vaccine.** / Consult your health care provider. Pregnant women should receive 1 dose of Tdap vaccine during each pregnancy. 1 dose of Td every 10 years.  Varicella vaccine.** / Consult your health care provider. Pregnant females who do not have evidence of immunity should receive the first dose after pregnancy.  Zoster vaccine.** / 1 dose for adults aged 5 years or older.  Measles, mumps, rubella (MMR) vaccine.** / You need at least 1 dose of MMR if you were born in 1957 or later. You may also need a 2nd dose. For females of childbearing age, rubella immunity should be determined. If there is no evidence of immunity, females who are not pregnant should be vaccinated. If there  is no evidence of immunity, females who are pregnant should delay immunization until after pregnancy.  Pneumococcal 13-valent conjugate (PCV13) vaccine.** / Consult your health care provider.  Pneumococcal polysaccharide (PPSV23) vaccine.** / 1 to 2 doses if you smoke cigarettes or if you have certain conditions.  Meningococcal vaccine.** / Consult your health care provider.  Hepatitis A vaccine.** / Consult your health care provider.  Hepatitis B vaccine.** / Consult your health care provider.  Haemophilus influenzae type b (Hib) vaccine.** / Consult your health care provider. Ages 12 years and over  Blood pressure check.** / Every 1 to 2 years.  Lipid and cholesterol check.** / Every 5 years beginning at age 82 years.  Lung  cancer screening. / Every year if you are aged 75 80 years and have a 30-pack-year history of smoking and currently smoke or have quit within the past 15 years. Yearly screening is stopped once you have quit smoking for at least 15 years or develop a health problem that would prevent you from having lung cancer treatment.  Clinical breast exam.** / Every year after age 71 years.  BRCA-related cancer risk assessment.** / For women who have family members with a BRCA-related cancer (breast, ovarian, tubal, or peritoneal cancers).  Mammogram.** / Every year beginning at age 69 years and continuing for as long as you are in good health. Consult with your health care provider.  Pap test.** / Every 3 years starting at age 28 years through age 58 or 67 years with 3 consecutive normal Pap tests. Testing can be stopped between 65 and 70 years with 3 consecutive normal Pap tests and no abnormal Pap or HPV tests in the past 10 years.  HPV screening.** / Every 3 years from ages 80 years through ages 19 or 60 years with a history of 3 consecutive normal Pap tests. Testing can be stopped between 65 and 70 years with 3 consecutive normal Pap tests and no abnormal Pap or HPV tests in the past 10 years.  Fecal occult blood test (FOBT) of stool. / Every year beginning at age 17 years and continuing until age 54 years. You may not need to do this test if you get a colonoscopy every 10 years.  Flexible sigmoidoscopy or colonoscopy.** / Every 5 years for a flexible sigmoidoscopy or every 10 years for a colonoscopy beginning at age 62 years and continuing until age 67 years.  Hepatitis C blood test.** / For all people born from [birth year] through 1967 and any individual with known risks for hepatitis C.  Osteoporosis screening.** / A one-time screening for women ages 21 years and over and women at risk for fractures or osteoporosis.  Skin self-exam. / Monthly.  Influenza vaccine. / Every year.  Tetanus, diphtheria, and  acellular pertussis (Tdap/Td) vaccine.** / 1 dose of Td every 10 years.  Varicella vaccine.** / Consult your health care provider.  Zoster vaccine.** / 1 dose for adults aged 90 years or older.  Pneumococcal 13-valent conjugate (PCV13) vaccine.** / Consult your health care provider.  Pneumococcal polysaccharide (PPSV23) vaccine.** / 1 dose for all adults aged 72 years and older.  Meningococcal vaccine.** / Consult your health care provider.  Hepatitis A vaccine.** / Consult your health care provider.  Hepatitis B vaccine.** / Consult your health care provider.  Haemophilus influenzae type b (Hib) vaccine.** / Consult your health care provider. ** Family history and personal history of risk and conditions may change your health care provider's recommendations. Document Released: 08/17/2001 Document Revised: 04/11/2013  Document Reviewed: 11/16/2010 [facility] Patient Information 2014 [place].

## 2013-11-14 ENCOUNTER — Ambulatory Visit (INDEPENDENT_AMBULATORY_CARE_PROVIDER_SITE_OTHER): Payer: Commercial Indemnity | Admitting: Surgery

## 2013-11-14 VITALS — BP 126/78 | HR 76 | Temp 97.2°F | Ht 64.0 in | Wt 192.0 lb

## 2013-11-14 DIAGNOSIS — D171 Benign lipomatous neoplasm of skin and subcutaneous tissue of trunk: Secondary | ICD-10-CM

## 2013-11-14 DIAGNOSIS — D1779 Benign lipomatous neoplasm of other sites: Secondary | ICD-10-CM

## 2013-11-14 NOTE — Progress Notes (Signed)
Kaitlin Watkins 68 y.o.  Body mass index is 32.94 kg/(m^2).  Patient Active Problem List   Diagnosis Date Noted  . History of DVT of lower extremity 10/30/2013  . Lipoma of back 09/27/2013  . Other malaise and fatigue 06/10/2013  . Unspecified vitamin D deficiency 06/10/2013  . 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 Knee pain, acute 06/10/2013  . 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 Tear of medial meniscus of right knee 04/19/2013  . Other and unspecified hyperlipidemia 04/07/2013  . Essential hypertension, benign 03/28/2013  . Obesity, unspecified 03/28/2013  . Medial meniscus tear 03/28/2013  . GERD (gastroesophageal reflux disease)     Allergies  Allergen Reactions  . Eggs Or Egg-Derived Products     Patient can eat eggs but cannot have a flu shot    Past Surgical History  Procedure Laterality Date  . Knee arthroscopy Left 1989  . Appendectomy  1975  . Carpal tunnel release Left 2009  . Cystostomy w/ bladder biopsy    . Refractive surgery Bilateral 2004  . Vaginal hysterectomy  1975    Pain  . Bilateral salpingectomy    . Leg surgery  2007    left leg DVT  . Abdominal hysterectomy  1975  . Skin cancer excision Left     foot  . Breast biopsy Left   . Tonsillectomy      [employer] Grade   . Knee arthroscopy with lateral menisectomy Right 04/19/2013    Procedure: ARTHROSCOPY RITH KNEE BILATERAL PARTIAL MENISECTOMY ;  Surgeon: Johnn Hai, MD;  Location: [facility];  Service: Orthopedics;  Laterality: Right;  . Lipoma excision N/A 10/26/2013    Procedure: EXCISION LIPOMA OF MID BACK;  Surgeon: [name], MD;  Location: [facility];  Service: General;  Laterality: N/A;   Ival Bible, MD No diagnosis found.  Site of lipoma healing OK.  There is some fluid in the cavity.  Will recheck for her in 2 months.  Path benign lipoma Matt B. Hassell Done, MD, [facility] [phone] beeper [phone]  11/14/2013 4:04 PM

## 2013-11-14 NOTE — Patient Instructions (Signed)
Your path showed a benign lipoma of your back.   The fluid in this site should resorb over the next 2 months

## 2013-11-16 ENCOUNTER — Other Ambulatory Visit: Payer: Self-pay

## 2013-11-16 ENCOUNTER — Telehealth: Payer: Self-pay

## 2013-11-16 ENCOUNTER — Other Ambulatory Visit: Payer: Self-pay | Admitting: Orthopedic Surgery

## 2013-11-16 DIAGNOSIS — Z1231 Encounter for screening mammogram for malignant neoplasm of breast: Secondary | ICD-10-CM

## 2013-11-16 NOTE — Telephone Encounter (Signed)
Crystelle needs [place] to fax an order for a bone density to [facility] phone number [phone]

## 2013-11-17 DIAGNOSIS — Z23 Encounter for immunization: Secondary | ICD-10-CM | POA: Insufficient documentation

## 2013-11-17 DIAGNOSIS — M7989 Other specified soft tissue disorders: Secondary | ICD-10-CM | POA: Insufficient documentation

## 2013-11-19 ENCOUNTER — Ambulatory Visit
Admission: RE | Admit: 2013-11-19 | Discharge: 2013-11-19 | Disposition: A | Discharge status: Home / Self Care | Payer: Medicare Other | Source: Ambulatory Visit

## 2013-11-19 ENCOUNTER — Other Ambulatory Visit: Payer: Self-pay | Admitting: Family Medicine

## 2013-11-19 ENCOUNTER — Telehealth: Payer: Self-pay | Admitting: *Deleted

## 2013-11-19 DIAGNOSIS — Z1231 Encounter for screening mammogram for malignant neoplasm of breast: Secondary | ICD-10-CM

## 2013-11-19 DIAGNOSIS — Z780 Asymptomatic menopausal state: Secondary | ICD-10-CM

## 2013-11-19 NOTE — Telephone Encounter (Signed)
Message copied by Ebbie Latus on Mon Nov 19, 2013  9:11 AM ------      Message from: Annia Belt      Created: Thu Nov 15, 2013  3:12 PM       Call patient: special blood tests do not show any sign of inherited clotting problems ------

## 2013-11-19 NOTE — Telephone Encounter (Signed)
Called pt - pt informed special blood tests did not show any sign of inherited clotting problems per Dr Beryle Beams.

## 2013-11-21 ENCOUNTER — Encounter (HOSPITAL_COMMUNITY): Payer: Self-pay | Admitting: Pharmacy Technician

## 2013-11-22 ENCOUNTER — Ambulatory Visit
Admission: RE | Admit: 2013-11-22 | Discharge: 2013-11-22 | Disposition: A | Discharge status: Home / Self Care | Payer: Medicare Other | Source: Ambulatory Visit | Attending: Family Medicine | Admitting: Family Medicine

## 2013-11-22 DIAGNOSIS — Z780 Asymptomatic menopausal state: Secondary | ICD-10-CM

## 2013-11-29 ENCOUNTER — Encounter (HOSPITAL_COMMUNITY)
Admission: RE | Admit: 2013-11-29 | Discharge: 2013-11-29 | Disposition: A | Discharge status: Home / Self Care | Payer: Medicare Other | Source: Ambulatory Visit | Attending: Specialist | Admitting: Specialist

## 2013-11-29 ENCOUNTER — Ambulatory Visit (HOSPITAL_COMMUNITY)
Admission: RE | Admit: 2013-11-29 | Discharge: 2013-11-29 | Disposition: A | Discharge status: Home / Self Care | Payer: Medicare Other | Source: Ambulatory Visit | Attending: Orthopedic Surgery | Admitting: Orthopedic Surgery

## 2013-11-29 ENCOUNTER — Encounter (HOSPITAL_COMMUNITY): Payer: Self-pay

## 2013-11-29 DIAGNOSIS — Z01812 Encounter for preprocedural laboratory examination: Secondary | ICD-10-CM | POA: Insufficient documentation

## 2013-11-29 DIAGNOSIS — M1120 Other chondrocalcinosis, unspecified site: Secondary | ICD-10-CM | POA: Diagnosis not present

## 2013-11-29 DIAGNOSIS — Z01818 Encounter for other preprocedural examination: Secondary | ICD-10-CM | POA: Insufficient documentation

## 2013-11-29 DIAGNOSIS — M1710 Unilateral primary osteoarthritis, unspecified knee: Secondary | ICD-10-CM | POA: Diagnosis not present

## 2013-11-29 LAB — BASIC METABOLIC PANEL
BUN: 12 mg/dL (ref 6–23)
CO2: 28 mEq/L (ref 19–32)
Calcium: 9.9 mg/dL (ref 8.4–10.5)
Chloride: 104 mEq/L (ref 96–112)
Creatinine, Ser: 0.93 mg/dL (ref 0.50–1.10)
GFR calc Af Amer: 71 mL/min — ABNORMAL LOW (ref >90)
GFR calc non Af Amer: 61 mL/min — ABNORMAL LOW (ref >90)
GLUCOSE: 108 mg/dL — AB (ref 70–99)
POTASSIUM: 5 meq/L (ref 3.7–5.3)
Sodium: 143 mEq/L (ref 137–147)

## 2013-11-29 LAB — CBC
HEMATOCRIT: 43 % (ref 36.0–46.0)
HEMOGLOBIN: 14 g/dL (ref 12.0–15.0)
MCH: 29 pg (ref 26.0–34.0)
MCHC: 32.6 g/dL (ref 30.0–36.0)
MCV: 89 fL (ref 78.0–100.0)
Platelets: 215 10*3/uL (ref 150–400)
RBC: 4.83 MIL/uL (ref 3.87–5.11)
RDW: 13.4 % (ref 11.5–15.5)
WBC: 5.5 10*3/uL (ref 4.0–10.5)

## 2013-11-29 LAB — URINALYSIS, ROUTINE W REFLEX MICROSCOPIC
BILIRUBIN URINE: NEGATIVE
GLUCOSE, UA: NEGATIVE mg/dL
HGB URINE DIPSTICK: NEGATIVE
Ketones, ur: NEGATIVE mg/dL
Leukocytes, UA: NEGATIVE
Nitrite: NEGATIVE
Protein, ur: NEGATIVE mg/dL
SPECIFIC GRAVITY, URINE: 1.003 — AB (ref 1.005–1.030)
Urobilinogen, UA: 0.2 mg/dL (ref 0.0–1.0)
pH: 7.5 (ref 5.0–8.0)

## 2013-11-29 LAB — PROTIME-INR
INR: 0.94 (ref 0.00–1.49)
Prothrombin Time: 12.4 seconds (ref 11.6–15.2)

## 2013-11-29 LAB — SURGICAL PCR SCREEN
MRSA, PCR: NEGATIVE
Staphylococcus aureus: NEGATIVE

## 2013-11-29 LAB — APTT: aPTT: 33 seconds (ref 24–37)

## 2013-11-29 NOTE — Progress Notes (Signed)
11/12/13-Pre-operative clearance from Dr. Dion Saucier on chart and EKG on chart.

## 2013-11-29 NOTE — Patient Instructions (Signed)
20     Your procedure is scheduled on:  Thursday 12/06/2013  Report to [facility] Main Entrance and follow signs to Short Stay  at 0800 AM.  Call this number if you have problems the night before or morning of surgery:   [phone]   Remember:             IF YOU USE CPAP,BRING MASK AND TUBING AM OF SURGERY!             IF YOU DO NOT HAVE YOUR TYPE AND SCREEN DRAWN AT PRE-ADMIT APPOINTMENT, YOU WILL HAVE IT DRAWN AM OF SURGERY!   Do not eat food or drink liquids AFTER MIDNIGHT!  Take these medicines the morning of surgery with A SIP OF WATER: NONE     IS NOT RESPONSIBLE FOR ANY BELONGINGS OR VALUABLES BROUGHT TO HOSPITAL.  Marland Kitchen  Leave suitcase in the car. After surgery it may be brought to your room.  For patients admitted to the hospital, checkout time is 11:00 AM the day of              Discharge.    DO NOT WEAR JEWELRY,MAKE-UP,LOTIONS,POWDERS,PERFUMES,CONTACTS , DENTURES OR BRIDGEWORK ,AND DO NOT WEAR FALSE EYELASHES                                    Patients discharged the day of surgery will not be allowed to drive home.  If going home the same day of surgery, must have someone stay with you  first 24 hrs.at home and arrange for someone to drive you home from the [place]: N/A   Special Instructions:              Please read over the following fact sheets that you were given:             1. [place].Kaitlin Watkins     [phone]                              [facility] - Preparing for Surgery Before surgery, you can play an important role.  Because skin is not sterile, your skin needs to be as free of germs as possible.  You can reduce the number of germs on your skin by washing with CHG (chlorahexidine gluconate) soap before surgery.  CHG is an antiseptic cleaner which kills germs and bonds with the skin  to continue killing germs even after washing. Please DO NOT use if you have an allergy to CHG or antibacterial soaps.  If your skin becomes reddened/irritated stop using the CHG and inform your nurse when you arrive at Short Stay. Do not shave (including legs and underarms) for at least 48 hours prior to the first CHG shower.  You may shave your face/neck. Please follow these instructions carefully:  1.  Shower with CHG Soap the  night before surgery and the  morning of Surgery.  2.  If you choose to wash your hair, wash your hair first as usual with your  normal  shampoo.  3.  After you shampoo, rinse your hair and body thoroughly to remove the  shampoo.                           4.  Use CHG as you would any other liquid soap.  You can apply chg directly  to the skin and wash                       Gently with a scrungie or clean washcloth.  5.  Apply the CHG Soap to your body ONLY FROM THE NECK DOWN.   Do not use on face/ open                           Wound or open sores. Avoid contact with eyes, ears mouth and genitals (private parts).                       Wash face,  Genitals (private parts) with your normal soap.             6.  Wash thoroughly, paying special attention to the area where your surgery  will be performed.  7.  Thoroughly rinse your body with warm water from the neck down.  8.  DO NOT shower/wash with your normal soap after using and rinsing off  the CHG Soap.                9.  Pat yourself dry with a clean towel.            10.  Wear clean pajamas.            11.  Place clean sheets on your bed the night of your first shower and do not  sleep with pets. Day of Surgery : Do not apply any lotions/deodorants the morning of surgery.  Please wear clean clothes to the hospital/surgery center.  FAILURE TO FOLLOW THESE INSTRUCTIONS MAY RESULT IN THE CANCELLATION OF YOUR SURGERY PATIENT SIGNATURE_________________________________  NURSE  SIGNATURE__________________________________  ________________________________________________________________________   Kaitlin Watkins  An incentive spirometer is a tool that can help keep your lungs clear and active. This tool measures how well you are filling your lungs with each breath. Taking long deep breaths may help reverse or decrease the chance of developing breathing (pulmonary) problems (especially infection) following:  A long period of time when you are unable to move or be active. BEFORE THE PROCEDURE   If the spirometer includes an indicator to show your best effort, your nurse or respiratory therapist will set it to a desired goal.  If possible, sit up straight or lean slightly forward. Try not to slouch.  Hold the incentive spirometer in an upright position. INSTRUCTIONS FOR USE  1. Sit on the edge of your bed if possible, or sit up as far as you can in bed or on a chair. 2. Hold the incentive spirometer in an upright position. 3. Breathe out normally. 4. Place the mouthpiece in your mouth and seal your lips tightly around it. 5. Breathe in slowly and as deeply as possible, raising the piston or the ball toward the top of the column. 6. Hold your breath for 3-5 seconds  or for as long as possible. Allow the piston or ball to fall to the bottom of the column. 7. Remove the mouthpiece from your mouth and breathe out normally. 8. Rest for a few seconds and repeat Steps 1 through 7 at least 10 times every 1-2 hours when you are awake. Take your time and take a few normal breaths between deep breaths. 9. The spirometer may include an indicator to show your best effort. Use the indicator as a goal to work toward during each repetition. 10. After each set of 10 deep breaths, practice coughing to be sure your lungs are clear. If you have an incision (the cut made at the time of surgery), support your incision when coughing by placing a pillow or rolled up towels firmly  against it. Once you are able to get out of bed, walk around indoors and cough well. You may stop using the incentive spirometer when instructed by your caregiver.  RISKS AND COMPLICATIONS  Take your time so you do not get dizzy or light-headed.  If you are in pain, you may need to take or ask for pain medication before doing incentive spirometry. It is harder to take a deep breath if you are having pain. AFTER USE  Rest and breathe slowly and easily.  It can be helpful to keep track of a log of your progress. Your caregiver can provide you with a simple table to help with this. If you are using the spirometer at home, follow these instructions: [place] IF:   You are having difficultly using the spirometer.  You have trouble using the spirometer as often as instructed.  Your pain medication is not giving enough relief while using the spirometer.  You develop fever of 100.5 F (38.1 C) or higher. SEEK IMMEDIATE MEDICAL CARE IF:   You cough up bloody sputum that had not been present before.  You develop fever of 102 F (38.9 C) or greater.  You develop worsening pain at or near the incision site. MAKE SURE YOU:   Understand these instructions.  Will watch your condition.  Will get help right away if you are not doing well or get worse. Document Released: 11/01/2006 Document Revised: 09/13/2011 Document Reviewed: 01/02/2007 [employer] Patient Information 2014 [employer], [place].   ________________________________________________________________________  WHAT IS A BLOOD TRANSFUSION? Blood Transfusion Information  A transfusion is the replacement of blood or some of its parts. Blood is made up of multiple cells which provide different functions.  Red blood cells carry oxygen and are used for blood loss replacement.  White blood cells fight against infection.  Platelets control bleeding.  Plasma helps clot blood.  Other blood products are available for  specialized needs, such as hemophilia or other clotting disorders. BEFORE THE TRANSFUSION  Who gives blood for transfusions?   Healthy volunteers who are fully evaluated to make sure their blood is safe. This is blood bank blood. Transfusion therapy is the safest it has ever been in the practice of medicine. Before blood is taken from a donor, a complete history is taken to make sure that person has no history of diseases nor engages in risky social behavior (examples are intravenous drug use or sexual activity with multiple partners). The donor's travel history is screened to minimize risk of transmitting infections, such as malaria. The donated blood is tested for signs of infectious diseases, such as HIV and hepatitis. The blood is then tested to be sure it is compatible with you in order to  minimize the chance of a transfusion reaction. If you or a relative donates blood, this is often done in anticipation of surgery and is not appropriate for emergency situations. It takes many days to process the donated blood. RISKS AND COMPLICATIONS Although transfusion therapy is very safe and saves many lives, the main dangers of transfusion include:   Getting an infectious disease.  Developing a transfusion reaction. This is an allergic reaction to something in the blood you were given. Every precaution is taken to prevent this. The decision to have a blood transfusion has been considered carefully by your caregiver before blood is given. Blood is not given unless the benefits outweigh the risks. AFTER THE TRANSFUSION  Right after receiving a blood transfusion, you will usually feel much better and more energetic. This is especially true if your red blood cells have gotten low (anemic). The transfusion raises the level of the red blood cells which carry oxygen, and this usually causes an energy increase.  The nurse administering the transfusion will monitor you carefully for complications. HOME CARE  INSTRUCTIONS  No special instructions are needed after a transfusion. You may find your energy is better. Speak with your caregiver about any limitations on activity for underlying diseases you may have. SEEK MEDICAL CARE IF:   Your condition is not improving after your transfusion.  You develop redness or irritation at the intravenous (IV) site. SEEK IMMEDIATE MEDICAL CARE IF:  Any of the following symptoms occur over the next 12 hours:  Shaking chills.  You have a temperature by mouth above 102 F (38.9 C), not controlled by medicine.  Chest, back, or muscle pain.  People around you feel you are not acting correctly or are confused.  Shortness of breath or difficulty breathing.  Dizziness and fainting.  You get a rash or develop hives.  You have a decrease in urine output.  Your urine turns a dark color or changes to pink, red, or brown. Any of the following symptoms occur over the next 10 days:  You have a temperature by mouth above 102 F (38.9 C), not controlled by medicine.  Shortness of breath.  Weakness after normal activity.  The white part of the eye turns yellow (jaundice).  You have a decrease in the amount of urine or are urinating less often.  Your urine turns a dark color or changes to pink, red, or brown. Document Released: 06/18/2000 Document Revised: 09/13/2011 Document Reviewed: 02/05/2008 [facility] Patient Information 2014 [place], [place].  _______________________________________________________________________

## 2013-11-29 NOTE — Progress Notes (Signed)
11/29/13 1524  OBSTRUCTIVE SLEEP APNEA  Have you ever been diagnosed with sleep apnea through a sleep study? No  Do you snore loudly (loud enough to be heard through closed doors)?  1  Do you often feel tired, fatigued, or sleepy during the daytime? 1  Has anyone observed you stop breathing during your sleep? 1  Do you have, or are you being treated for high blood pressure? 1  BMI more than 35 kg/m2? 0  Age over 70 years old? 1  Neck circumference greater than 40 cm/16 inches? 0  Gender: 0  Obstructive Sleep Apnea Score 5  Score 4 or greater  Results sent to PCP

## 2013-11-29 NOTE — Progress Notes (Signed)
11/29/13 1632  OBSTRUCTIVE SLEEP APNEA  Have you ever been diagnosed with sleep apnea through a sleep study? No  Do you snore loudly (loud enough to be heard through closed doors)?  1  Do you often feel tired, fatigued, or sleepy during the daytime? 1  Has anyone observed you stop breathing during your sleep? 0  Do you have, or are you being treated for high blood pressure? 1  BMI more than 35 kg/m2? 0  Age over 70 years old? 1  Neck circumference greater than 40 cm/16 inches? 0  Gender: 0  Obstructive Sleep Apnea Score 4  Score 4 or greater  Results sent to PCP

## 2013-12-02 ENCOUNTER — Other Ambulatory Visit: Payer: Self-pay | Admitting: Orthopedic Surgery

## 2013-12-02 NOTE — H&P (Signed)
Kaitlin Watkins DOB: 09/23/1945 Married / Language: English / Race: White Female  H&P date 11/29/13  Chief Complaint: L knee pain  History of Present Illness The patient is a 68 year old female who comes in today for a preoperative History and Physical. The patient is scheduled for a left total knee arthroplasty to be performed by Dr. Jeffrey C. Beane, MD at [facility] on December 06, 2013. Tilley is c/o ongoing L knee pain due to DJD, refractory to arthroscopy, steroid injections, viscosupplementation, activity modifications, relative rest, bracing, NSAIDs, pain medicatons. She is severely disabled by this as opposed to mild pain, limiting ADL's.  Dr. Beane and the patient mutually agreed to proceed with a total knee replacement. Risks and benefits of the procedure were discussed including stiffness, suboptimal range of motion, persistent pain, infection requiring removal of prosthesis and reinsertion, need for prophylactic antibiotics in the future, for example, dental procedures, possible need for manipulation, revision in the future and also anesthetic complications including DVT, PE, etc. We discussed the perioperative course, time in the hospital, postoperative recovery and the need for elevation to control swelling. We also discussed the predicted range of motion and the probability that squatting and kneeling would be unobtainable in the future. In addition, postoperative anticoagulation was discussed. We have obtained preoperative medical clearance as necessary- Dr. Zenard and Dr. Granfortuna. Provided her illustrated handout and discussed it in detail. They will enroll in the total joint replacement educational forum at the hospital.  Allergies Egg Influenza Virus Vaccine *VACCINES* Penicillin G Sodium *PENICILLINS*  Family History Osteoarthritis. mother, grandfather mothers side and grandmother fathers side Cerebrovascular Accident. First Degree Relatives. mother  and father Diabetes Mellitus. First Degree Relatives. mother and sister Heart disease in female family member before age 65 Heart disease in female family member before age 55 Cancer. mother Heart Disease. mother and brother Congestive Heart Failure. First Degree Relatives. mother First Degree Relatives  Social History Tobacco use. Former smoker. former smoker; smoke(d) less than 1/2 pack(s) per day, quit in 1967 Current work status. working full time Illicit drug use. no Children. 1 Exercise. Exercises rarely Alcohol use. current drinker; drinks wine; only occasionally per week Marital status. married Living situation. live with spouse; one floor home with no steps to enter Pain Contract. no Tobacco / smoke exposure. no Number of flights of stairs before winded. 2-3 Drug/Alcohol Rehab (Previously). no Drug/Alcohol Rehab (Currently). no Post-Surgical Plans. home with HHPT  Medication History Lipitor ( Oral) Specific dose unknown - Active. CoQ10 ( Oral) Specific dose unknown - Active. Dexilant (60MG Capsule DR, Oral) Active. Calcium Carbonate ( Oral) Specific dose unknown - Active. Hydrocodone-Acetaminophen (7.5-325MG Tablet, Oral) Active. TraMADol HCl (50MG Tablet, Oral) Active. Mobic (15MG Tablet, Oral) Active. Calcium Citrate (200MG Tablet, 1 (one) Oral) Active. NexIUM ( Oral) Specific dose unknown - Active. Glucosamine Chondroitin Complx ( Oral) Active. Multiple Vitamin (1 (one) Oral) Active. Vitamin B Complex (1 (one) Oral) Active. Fish Oil Concentrate (435MG Capsule, 1 (one) Oral) Active. Benicar HCT (20-12.5MG Tablet, Oral) Active. Medications Reconciled.  Pregnancy / Birth History Pregnant. no  Past Surgical History Carpal Tunnel Repair. left Breast Biopsy. left Arthroscopy of Knee. left Tonsillectomy Hysterectomy. partial (non-cancerous) Straighten Nasal Septum Appendectomy Other Orthopaedic Surgery. left foot tumor removal  1995 Arthroscopic Knee Surgery - Right  Past Medical Hx Blood Clot Skin Cancer Gastroesophageal Reflux Disease Osteoarthritis Hypercholesterolemia Osteoporosis Hypertension Phlebitis Varicose veins Hemorrhoids Urinary Incontinence. frequency Cystitis Mumps Measles Streptococcus Infections Staphlococcus Infections Menopause  Review of Systems   General:Present- Night Sweats. Not Present- Chills, Fever, Fatigue, Weight Gain, Weight Loss and Memory Loss. Skin:Not Present- Hives, Itching, Rash, Eczema and Lesions. HEENT:Not Present- Tinnitus, Headache, Double Vision, Visual Loss, Hearing Loss and Dentures. Respiratory:Present- Shortness of breath with exertion and Chronic Cough (secondary to sinus issues). Not Present- Shortness of breath at rest, Allergies and Coughing up blood. Cardiovascular:Not Present- Chest Pain, Racing/skipping heartbeats, Difficulty Breathing Lying Down, Murmur, Swelling and Palpitations. Gastrointestinal:Not Present- Bloody Stool, Heartburn, Abdominal Pain, Vomiting, Nausea, Constipation, Diarrhea, Difficulty Swallowing, Jaundice and Loss of appetitie. Female Genitourinary:Present- Urinary frequency. Not Present- Blood in Urine, Weak urinary stream, Discharge, Flank Pain, Incontinence, Painful Urination, Urgency, Urinary Retention and Urinating at Night. Musculoskeletal:Present- Joint Stiffness, Joint Swelling and Joint Pain. Not Present- Muscle Weakness, Muscle Pain, Back Pain, Morning Stiffness and Spasms. Neurological:Not Present- Tremor, Dizziness, Blackout spells, Paralysis, Difficulty with balance and Weakness. Psychiatric:Present- Insomnia.  Physical Exam The physical exam findings are as follows:  General Mental Status - Alert, cooperative and good historian. General Appearance- pleasant. Not in acute distress. Orientation- Oriented X3. Build & Nutrition- Well nourished and Well developed.  Head and Neck Head- normocephalic,  atraumatic . Neck Global Assessment- supple. no bruit auscultated on the right and no bruit auscultated on the left.  Eye Pupil- Bilateral- Regular and Round. Motion- Bilateral- EOMI.  Chest and Lung Exam Auscultation: Breath sounds:- clear at anterior chest wall and - clear at posterior chest wall. Adventitious sounds:- No Adventitious sounds.  Cardiovascular Auscultation:Rhythm- Regular rate and rhythm. Heart Sounds- S1 WNL and S2 WNL. Murmurs & Other Heart Sounds:Auscultation of the heart reveals - No Murmurs.  Abdomen Palpation/Percussion:Tenderness- Abdomen is non-tender to palpation. Rigidity (guarding)- Abdomen is soft. Auscultation:Auscultation of the abdomen reveals - Bowel sounds normal.  Female Genitourinary Not done, not pertinent to present illness  Musculoskeletal Tender at the medial joint line. Patellofemoral pain with compression. Range of motion is -5-90 on her knee. There is some varus instability noted. No DVT. Knee exam on inspection reveals no evidence of soft tissue swelling, ecchymosis, deformity or erythema. On palpation there is no tenderness in the lateral joint line. Nontender over the fibular head or the peroneal nerve. Nontender over the quadriceps insertion of the patellar ligament insertion. The range of motion was full. Provocative maneuvers revealed a negative Lachman, negative anterior and posterior drawer and a negative McMurray. No instability was noted with valgus stressing at 0 or 30 degrees. On manual motor test the quadriceps and hamstrings were 5/5. Sensory exam was intact to light touch.  Imaging AP standing and lateral xrays demonstrate bone-on-bone arthrosis of the medial compartment of patellofemoral joint  Assessment & Plan L knee DJD  Pt with severe L knee DJD, end-stage, bone-on-bone, refractory to conservative tx, scheduled for L TKA by Dr. Tonita Cong on 12/06/13. 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 She has been cleared for surgery. Again discussed surgery itself as well as risks, complications and alternatives, including but not limited to DVT, PE, infx, bleeding, failure of procedure, need for secondary procedure including manipulation, nerve injury, ongoing pain/symptoms, anesthesia risk, even stroke or death. Also discussed typical post-op protocols, activity restrictions, need for PT, flexion/extension exercises, time out of work. Discussed need for DVT ppx post-op with Xarelto then ASA per protocol. Discussed dental ppx. Also discussed limitations post-operatively such as kneeling and squatting. All questions were answered. Patient desires to proceed with surgery as scheduled. She will remain NPO after MN night before surgery.  Will hold ASA, vitamins, NSAIDs, supplements accordingly. She did have one prior incidence of PONV, will plan IV tylenol, exparil to cut back on narcotic use peri-op. She has a hx of PCN reaction- rash only. Will increase strength of her Norco now and plan Percocet for pain post-op. Plan Xarelto post-op, likely for 3 weeks given her prior hx of DVT. She will follow up 10-14 days post-op for suture removal and will call with any questions or concerns in the interim.  Plan Left total knee replacement  Signed electronically by Milanni Ayub M Pietrina Jagodzinski, PA-C for Dr. Beane    

## 2013-12-06 ENCOUNTER — Encounter (HOSPITAL_COMMUNITY): Payer: Self-pay

## 2013-12-06 ENCOUNTER — Encounter (HOSPITAL_COMMUNITY): Payer: Medicare Other | Admitting: Anesthesiology

## 2013-12-06 ENCOUNTER — Inpatient Hospital Stay (HOSPITAL_COMMUNITY): Payer: Medicare Other | Admitting: Anesthesiology

## 2013-12-06 ENCOUNTER — Inpatient Hospital Stay (HOSPITAL_COMMUNITY): Payer: Medicare Other

## 2013-12-06 ENCOUNTER — Encounter (HOSPITAL_COMMUNITY)
Admission: RE | Disposition: A | Discharge status: Home Health | Payer: Self-pay | Source: Ambulatory Visit | Attending: Specialist

## 2013-12-06 ENCOUNTER — Inpatient Hospital Stay (HOSPITAL_COMMUNITY)
Admission: RE | Admit: 2013-12-06 | Discharge: 2013-12-10 | DRG: 470 | Disposition: A | Discharge status: Home Health | Payer: Medicare Other | Source: Ambulatory Visit | Attending: Specialist | Admitting: Specialist

## 2013-12-06 DIAGNOSIS — Z87891 Personal history of nicotine dependence: Secondary | ICD-10-CM | POA: Diagnosis not present

## 2013-12-06 DIAGNOSIS — K219 Gastro-esophageal reflux disease without esophagitis: Secondary | ICD-10-CM | POA: Diagnosis present

## 2013-12-06 DIAGNOSIS — M1712 Unilateral primary osteoarthritis, left knee: Secondary | ICD-10-CM

## 2013-12-06 DIAGNOSIS — Z86718 Personal history of other venous thrombosis and embolism: Secondary | ICD-10-CM

## 2013-12-06 DIAGNOSIS — E785 Hyperlipidemia, unspecified: Secondary | ICD-10-CM | POA: Diagnosis present

## 2013-12-06 DIAGNOSIS — Z6833 Body mass index (BMI) 33.0-33.9, adult: Secondary | ICD-10-CM | POA: Diagnosis not present

## 2013-12-06 DIAGNOSIS — I10 Essential (primary) hypertension: Secondary | ICD-10-CM | POA: Diagnosis present

## 2013-12-06 DIAGNOSIS — M25569 Pain in unspecified knee: Secondary | ICD-10-CM | POA: Diagnosis present

## 2013-12-06 DIAGNOSIS — M7989 Other specified soft tissue disorders: Secondary | ICD-10-CM

## 2013-12-06 DIAGNOSIS — M1710 Unilateral primary osteoarthritis, unspecified knee: Secondary | ICD-10-CM | POA: Diagnosis present

## 2013-12-06 HISTORY — PX: TOTAL KNEE ARTHROPLASTY: SHX125

## 2013-12-06 LAB — TYPE AND SCREEN
ABO/RH(D): O POS
Antibody Screen: NEGATIVE

## 2013-12-06 LAB — ABO/RH: ABO/RH(D): O POS

## 2013-12-06 SURGERY — ARTHROPLASTY, KNEE, TOTAL
Anesthesia: General | Site: Knee | Laterality: Left

## 2013-12-06 MED ORDER — ONDANSETRON HCL 4 MG/2ML IJ SOLN
INTRAMUSCULAR | Status: AC
  Filled 2013-12-06: qty 2

## 2013-12-06 MED ORDER — CEFAZOLIN SODIUM-DEXTROSE 2-3 GM-% IV SOLR
2.0000 g | Freq: Four times a day (QID) | INTRAVENOUS | Status: AC
  Administered 2013-12-06 – 2013-12-07 (×3): 2 g via INTRAVENOUS
  Filled 2013-12-06 (×3): qty 50

## 2013-12-06 MED ORDER — ONDANSETRON HCL 4 MG PO TABS
4.0000 mg | ORAL_TABLET | Freq: Four times a day (QID) | ORAL | Status: DC | PRN
  Filled 2013-12-06: qty 1

## 2013-12-06 MED ORDER — FENTANYL CITRATE 0.05 MG/ML IJ SOLN
INTRAMUSCULAR | Status: DC | PRN
  Administered 2013-12-06: 100 ug via INTRAVENOUS
  Administered 2013-12-06 (×2): 25 ug via INTRAVENOUS
  Administered 2013-12-06 (×4): 50 ug via INTRAVENOUS

## 2013-12-06 MED ORDER — ACETAMINOPHEN 10 MG/ML IV SOLN
1000.0000 mg | Freq: Once | INTRAVENOUS | Status: AC
  Administered 2013-12-06: 1000 mg via INTRAVENOUS
  Filled 2013-12-06: qty 100

## 2013-12-06 MED ORDER — HYDROMORPHONE HCL PF 1 MG/ML IJ SOLN
1.0000 mg | INTRAMUSCULAR | Status: DC | PRN
  Administered 2013-12-07: 1 mg via INTRAVENOUS
  Filled 2013-12-06: qty 1

## 2013-12-06 MED ORDER — MIDAZOLAM HCL 2 MG/2ML IJ SOLN
INTRAMUSCULAR | Status: AC
  Filled 2013-12-06: qty 2

## 2013-12-06 MED ORDER — PROMETHAZINE HCL 25 MG/ML IJ SOLN: 6.2500 mg | INTRAMUSCULAR | Status: DC | PRN

## 2013-12-06 MED ORDER — CEFAZOLIN SODIUM-DEXTROSE 2-3 GM-% IV SOLR
2.0000 g | INTRAVENOUS | Status: AC
  Administered 2013-12-06: 2 g via INTRAVENOUS

## 2013-12-06 MED ORDER — METOCLOPRAMIDE HCL 10 MG PO TABS
5.0000 mg | ORAL_TABLET | Freq: Three times a day (TID) | ORAL | Status: DC | PRN
  Administered 2013-12-08: 10 mg via ORAL
  Filled 2013-12-06: qty 1

## 2013-12-06 MED ORDER — RIVAROXABAN 10 MG PO TABS
10.0000 mg | ORAL_TABLET | Freq: Every day | ORAL | Status: DC
  Administered 2013-12-07 – 2013-12-10 (×4): 10 mg via ORAL
  Filled 2013-12-06 (×5): qty 1

## 2013-12-06 MED ORDER — SODIUM CHLORIDE 0.45 % IV SOLN
INTRAVENOUS | Status: AC
  Administered 2013-12-06 – 2013-12-07 (×2): via INTRAVENOUS

## 2013-12-06 MED ORDER — HYDROCHLOROTHIAZIDE 12.5 MG PO CAPS
12.5000 mg | ORAL_CAPSULE | Freq: Every day | ORAL | Status: DC
  Administered 2013-12-07 – 2013-12-09 (×3): 12.5 mg via ORAL
  Filled 2013-12-06 (×5): qty 1

## 2013-12-06 MED ORDER — FENTANYL CITRATE 0.05 MG/ML IJ SOLN: 25.0000 ug | INTRAMUSCULAR | Status: DC | PRN

## 2013-12-06 MED ORDER — ACETAMINOPHEN 650 MG RE SUPP: 650.0000 mg | Freq: Four times a day (QID) | RECTAL | Status: DC | PRN

## 2013-12-06 MED ORDER — PHENOL 1.4 % MT LIQD: 1.0000 | OROMUCOSAL | Status: DC | PRN

## 2013-12-06 MED ORDER — ONDANSETRON HCL 4 MG/2ML IJ SOLN
4.0000 mg | Freq: Four times a day (QID) | INTRAMUSCULAR | Status: DC | PRN
  Administered 2013-12-07: 4 mg via INTRAVENOUS
  Filled 2013-12-06: qty 2

## 2013-12-06 MED ORDER — LOSARTAN POTASSIUM 50 MG PO TABS
50.0000 mg | ORAL_TABLET | Freq: Every day | ORAL | Status: DC
  Administered 2013-12-07 – 2013-12-09 (×3): 50 mg via ORAL
  Filled 2013-12-06 (×5): qty 1

## 2013-12-06 MED ORDER — OXYCODONE HCL 5 MG PO TABS: 5.0000 mg | ORAL_TABLET | ORAL | Status: DC | PRN

## 2013-12-06 MED ORDER — MELATONIN 3 MG PO TABS: 3.0000 mg | ORAL_TABLET | Freq: Every day | ORAL | Status: DC

## 2013-12-06 MED ORDER — BUPIVACAINE LIPOSOME 1.3 % IJ SUSP
20.0000 mL | Freq: Once | INTRAMUSCULAR | Status: AC
  Administered 2013-12-06: 20 mL
  Filled 2013-12-06: qty 20

## 2013-12-06 MED ORDER — LOSARTAN POTASSIUM-HCTZ 50-12.5 MG PO TABS: 1.0000 | ORAL_TABLET | Freq: Every morning | ORAL | Status: DC

## 2013-12-06 MED ORDER — HYDROCODONE-ACETAMINOPHEN 5-325 MG PO TABS
1.0000 | ORAL_TABLET | ORAL | Status: DC | PRN
  Administered 2013-12-07 – 2013-12-08 (×2): 1 via ORAL
  Administered 2013-12-08: 2 via ORAL
  Administered 2013-12-10: 1 via ORAL
  Filled 2013-12-06: qty 1
  Filled 2013-12-06: qty 2
  Filled 2013-12-06 (×2): qty 1

## 2013-12-06 MED ORDER — DEXAMETHASONE SODIUM PHOSPHATE 10 MG/ML IJ SOLN
INTRAMUSCULAR | Status: DC | PRN
  Administered 2013-12-06: 10 mg via INTRAVENOUS

## 2013-12-06 MED ORDER — MENTHOL 3 MG MT LOZG
1.0000 | LOZENGE | OROMUCOSAL | Status: DC | PRN
  Filled 2013-12-06: qty 9

## 2013-12-06 MED ORDER — ACETAMINOPHEN 325 MG PO TABS
650.0000 mg | ORAL_TABLET | Freq: Four times a day (QID) | ORAL | Status: DC | PRN
  Administered 2013-12-08 – 2013-12-09 (×4): 650 mg via ORAL
  Filled 2013-12-06 (×4): qty 2

## 2013-12-06 MED ORDER — LACTATED RINGERS IV SOLN
INTRAVENOUS | Status: DC
  Administered 2013-12-06 (×3): via INTRAVENOUS

## 2013-12-06 MED ORDER — MIDAZOLAM HCL 5 MG/5ML IJ SOLN
INTRAMUSCULAR | Status: DC | PRN
  Administered 2013-12-06: 2 mg via INTRAVENOUS

## 2013-12-06 MED ORDER — HYDROMORPHONE HCL PF 1 MG/ML IJ SOLN
INTRAMUSCULAR | Status: AC
  Filled 2013-12-06: qty 1

## 2013-12-06 MED ORDER — CEFAZOLIN SODIUM-DEXTROSE 2-3 GM-% IV SOLR
INTRAVENOUS | Status: AC
  Filled 2013-12-06: qty 50

## 2013-12-06 MED ORDER — METOCLOPRAMIDE HCL 5 MG/ML IJ SOLN: 5.0000 mg | Freq: Three times a day (TID) | INTRAMUSCULAR | Status: DC | PRN

## 2013-12-06 MED ORDER — PROPOFOL 10 MG/ML IV BOLUS
INTRAVENOUS | Status: AC
  Filled 2013-12-06: qty 20

## 2013-12-06 MED ORDER — OXYCODONE-ACETAMINOPHEN 5-325 MG PO TABS: 1.0000 | ORAL_TABLET | ORAL | Status: DC | PRN

## 2013-12-06 MED ORDER — DOCUSATE SODIUM 100 MG PO CAPS: 100.0000 mg | ORAL_CAPSULE | Freq: Two times a day (BID) | ORAL | Status: DC | PRN

## 2013-12-06 MED ORDER — FENTANYL CITRATE 0.05 MG/ML IJ SOLN
INTRAMUSCULAR | Status: AC
  Filled 2013-12-06: qty 2

## 2013-12-06 MED ORDER — FENTANYL CITRATE 0.05 MG/ML IJ SOLN
INTRAMUSCULAR | Status: AC
  Filled 2013-12-06: qty 5

## 2013-12-06 MED ORDER — OXYCODONE HCL 5 MG PO TABS
5.0000 mg | ORAL_TABLET | ORAL | Status: DC | PRN
  Administered 2013-12-07: 5 mg via ORAL
  Administered 2013-12-07 – 2013-12-10 (×15): 10 mg via ORAL
  Filled 2013-12-06 (×12): qty 2
  Filled 2013-12-06: qty 1
  Filled 2013-12-06 (×3): qty 2

## 2013-12-06 MED ORDER — DOCUSATE SODIUM 100 MG PO CAPS
100.0000 mg | ORAL_CAPSULE | Freq: Two times a day (BID) | ORAL | Status: DC
  Administered 2013-12-06 – 2013-12-10 (×8): 100 mg via ORAL

## 2013-12-06 MED ORDER — METHOCARBAMOL 500 MG PO TABS
500.0000 mg | ORAL_TABLET | Freq: Four times a day (QID) | ORAL | Status: DC | PRN
  Administered 2013-12-07 – 2013-12-09 (×8): 500 mg via ORAL
  Filled 2013-12-06 (×8): qty 1

## 2013-12-06 MED ORDER — PROPOFOL 10 MG/ML IV BOLUS
INTRAVENOUS | Status: DC | PRN
  Administered 2013-12-06: 100 mg via INTRAVENOUS

## 2013-12-06 MED ORDER — ONDANSETRON HCL 4 MG/2ML IJ SOLN
INTRAMUSCULAR | Status: DC | PRN
  Administered 2013-12-06: 4 mg via INTRAVENOUS

## 2013-12-06 MED ORDER — METHOCARBAMOL 1000 MG/10ML IJ SOLN
500.0000 mg | Freq: Four times a day (QID) | INTRAVENOUS | Status: DC | PRN
  Administered 2013-12-06: 500 mg via INTRAVENOUS
  Filled 2013-12-06: qty 5

## 2013-12-06 MED ORDER — SODIUM CHLORIDE 0.9 % IR SOLN
Status: DC | PRN
  Administered 2013-12-06: 11:00:00

## 2013-12-06 MED ORDER — PHENTERMINE HCL 37.5 MG PO TABS: 37.5000 mg | ORAL_TABLET | Freq: Every day | ORAL | Status: DC

## 2013-12-06 MED ORDER — DOCUSATE SODIUM 100 MG PO CAPS
100.0000 mg | ORAL_CAPSULE | Freq: Two times a day (BID) | ORAL | Status: DC
  Filled 2013-12-06 (×4): qty 1

## 2013-12-06 MED ORDER — RIVAROXABAN 10 MG PO TABS: 10.0000 mg | ORAL_TABLET | Freq: Every day | ORAL | Status: DC

## 2013-12-06 MED ORDER — SUCCINYLCHOLINE CHLORIDE 20 MG/ML IJ SOLN
INTRAMUSCULAR | Status: DC | PRN
  Administered 2013-12-06: 100 mg via INTRAVENOUS

## 2013-12-06 MED ORDER — HYDROMORPHONE HCL PF 1 MG/ML IJ SOLN
0.2500 mg | INTRAMUSCULAR | Status: DC | PRN
  Administered 2013-12-06 (×4): 0.5 mg via INTRAVENOUS

## 2013-12-06 MED ORDER — DEXAMETHASONE SODIUM PHOSPHATE 10 MG/ML IJ SOLN
INTRAMUSCULAR | Status: AC
  Filled 2013-12-06: qty 1

## 2013-12-06 SURGICAL SUPPLY — 68 items
BAG SPEC THK2 15X12 ZIP CLS (MISCELLANEOUS) ×1
BAG ZIPLOCK 12X15 (MISCELLANEOUS) ×3 IMPLANT
BANDAGE ELASTIC 4 VELCRO ST LF (GAUZE/BANDAGES/DRESSINGS) ×3 IMPLANT
BANDAGE ELASTIC 6 VELCRO ST LF (GAUZE/BANDAGES/DRESSINGS) ×3 IMPLANT
BANDAGE ESMARK 6X9 LF (GAUZE/BANDAGES/DRESSINGS) ×1 IMPLANT
BLADE SAG 18X100X1.27 (BLADE) ×3 IMPLANT
BLADE SAW SGTL 13.0X1.19X90.0M (BLADE) ×3 IMPLANT
BNDG CMPR 9X6 STRL LF SNTH (GAUZE/BANDAGES/DRESSINGS) ×1
BNDG ESMARK 6X9 LF (GAUZE/BANDAGES/DRESSINGS) ×3
CAPT RP KNEE ×2 IMPLANT
CEMENT HV SMART SET (Cement) ×6 IMPLANT
CHLORAPREP W/TINT 26ML (MISCELLANEOUS) IMPLANT
CLOSURE WOUND 1/2 X4 (GAUZE/BANDAGES/DRESSINGS) ×1
CLOTH 2% CHLOROHEXIDINE 3PK (PERSONAL CARE ITEMS) ×3 IMPLANT
CUFF TOURN SGL QUICK 34 (TOURNIQUET CUFF) ×3
CUFF TRNQT CYL 34X4X40X1 (TOURNIQUET CUFF) ×1 IMPLANT
DRAPE LG THREE QUARTER DISP (DRAPES) ×3 IMPLANT
DRAPE ORTHO SPLIT 77X108 STRL (DRAPES) ×6
DRAPE POUCH INSTRU U-SHP 10X18 (DRAPES) ×3 IMPLANT
DRAPE SURG ORHT 6 SPLT 77X108 (DRAPES) ×2 IMPLANT
DRAPE U-SHAPE 47X51 STRL (DRAPES) ×3 IMPLANT
DRSG ADAPTIC 3X8 NADH LF (GAUZE/BANDAGES/DRESSINGS) IMPLANT
DRSG AQUACEL AG ADV 3.5X10 (GAUZE/BANDAGES/DRESSINGS) ×2 IMPLANT
DRSG PAD ABDOMINAL 8X10 ST (GAUZE/BANDAGES/DRESSINGS) IMPLANT
DRSG TEGADERM 4X4.75 (GAUZE/BANDAGES/DRESSINGS) ×2 IMPLANT
DURAPREP 26ML APPLICATOR (WOUND CARE) ×3 IMPLANT
ELECT REM PT RETURN 9FT ADLT (ELECTROSURGICAL) ×3
ELECTRODE REM PT RTRN 9FT ADLT (ELECTROSURGICAL) ×1 IMPLANT
EVACUATOR 1/8 PVC DRAIN (DRAIN) ×3 IMPLANT
FACESHIELD WRAPAROUND (MASK) ×15 IMPLANT
FACESHIELD WRAPAROUND OR TEAM (MASK) ×5 IMPLANT
GAUZE SPONGE 2X2 8PLY STRL LF (GAUZE/BANDAGES/DRESSINGS) IMPLANT
GLOVE BIOGEL PI IND STRL 7.5 (GLOVE) ×1 IMPLANT
GLOVE BIOGEL PI IND STRL 8 (GLOVE) ×1 IMPLANT
GLOVE BIOGEL PI INDICATOR 7.5 (GLOVE) ×2
GLOVE BIOGEL PI INDICATOR 8 (GLOVE) ×2
GLOVE SURG SS PI 7.5 STRL IVOR (GLOVE) ×3 IMPLANT
GLOVE SURG SS PI 8.0 STRL IVOR (GLOVE) ×6 IMPLANT
GOWN STRL REUS W/TWL XL LVL3 (GOWN DISPOSABLE) ×6 IMPLANT
HANDPIECE INTERPULSE COAX TIP (DISPOSABLE) ×3
IMMOBILIZER KNEE 20 (SOFTGOODS) ×3 IMPLANT
KIT BASIN OR (CUSTOM PROCEDURE TRAY) ×1 IMPLANT
MANIFOLD NEPTUNE II (INSTRUMENTS) ×3 IMPLANT
NEEDLE HYPO 22GX1.5 SAFETY (NEEDLE) ×3 IMPLANT
NS IRRIG 1000ML POUR BTL (IV SOLUTION) IMPLANT
PACK TOTAL JOINT (CUSTOM PROCEDURE TRAY) ×3 IMPLANT
PADDING CAST COTTON 6X4 STRL (CAST SUPPLIES) IMPLANT
POSITIONER SURGICAL ARM (MISCELLANEOUS) ×3 IMPLANT
SET HNDPC FAN SPRY TIP SCT (DISPOSABLE) ×1 IMPLANT
SPONGE GAUZE 2X2 STER 10/PKG (GAUZE/BANDAGES/DRESSINGS) ×2
SPONGE GAUZE 4X4 12PLY (GAUZE/BANDAGES/DRESSINGS) IMPLANT
SPONGE SURGIFOAM ABS GEL 100 (HEMOSTASIS) IMPLANT
STAPLER VISISTAT (STAPLE) IMPLANT
STRIP CLOSURE SKIN 1/2X4 (GAUZE/BANDAGES/DRESSINGS) ×1 IMPLANT
SUCTION FRAZIER 12FR DISP (SUCTIONS) ×3 IMPLANT
SUT BONE WAX W31G (SUTURE) IMPLANT
SUT MNCRL AB 4-0 PS2 18 (SUTURE) IMPLANT
SUT VIC AB 1 CT1 27 (SUTURE) ×3
SUT VIC AB 1 CT1 27XBRD ANTBC (SUTURE) ×1 IMPLANT
SUT VIC AB 2-0 CT1 27 (SUTURE) ×9
SUT VIC AB 2-0 CT1 TAPERPNT 27 (SUTURE) ×3 IMPLANT
SUT VLOC 180 0 24IN GS25 (SUTURE) ×3 IMPLANT
SYR 20CC LL (SYRINGE) ×3 IMPLANT
TOWEL OR 17X26 10 PK STRL BLUE (TOWEL DISPOSABLE) ×3 IMPLANT
TOWER CARTRIDGE SMART MIX (DISPOSABLE) ×3 IMPLANT
TRAY FOLEY CATH 14FRSI W/METER (CATHETERS) ×3 IMPLANT
WATER STERILE IRR 1500ML POUR (IV SOLUTION) ×4 IMPLANT
WRAP KNEE MAXI GEL POST OP (GAUZE/BANDAGES/DRESSINGS) ×3 IMPLANT

## 2013-12-06 NOTE — Transfer of Care (Signed)
Immediate Anesthesia Transfer of Care Note  Patient: Kaitlin Watkins  Procedure(s) Performed: Procedure(s): LEFT TOTAL KNEE ARTHROPLASTY (Left)  Patient Location: PACU  Anesthesia Type:General  Level of Consciousness: sedated and patient cooperative  Airway & Oxygen Therapy: Patient Spontanous Breathing and Patient connected to face mask oxygen  Post-op Assessment: Report given to PACU RN and Post -op Vital signs reviewed and stable  Post vital signs: Reviewed and stable  Complications: No apparent anesthesia complications

## 2013-12-06 NOTE — Anesthesia Postprocedure Evaluation (Signed)
  Anesthesia Post-op Note  Patient: Kaitlin Watkins  Procedure(s) Performed: Procedure(s) (LRB): LEFT TOTAL KNEE ARTHROPLASTY (Left)  Patient Location: PACU  Anesthesia Type: General  Level of Consciousness: awake and alert   Airway and Oxygen Therapy: Patient Spontanous Breathing  Post-op Pain: mild  Post-op Assessment: Post-op Vital signs reviewed, Patient's Cardiovascular Status Stable, Respiratory Function Stable, Patent Airway and No signs of Nausea or vomiting  Last Vitals:  Filed Vitals:   12/06/13 1330  BP: 146/92  Pulse: 66  Temp: 36.7 C  Resp: 13    Post-op Vital Signs: stable   Complications: No apparent anesthesia complications

## 2013-12-06 NOTE — Interval H&P Note (Signed)
History and Physical Interval Note:  12/06/2013 7:41 AM  Kaitlin Watkins  has presented today for surgery, with the diagnosis of DJD LEFT KNEE   The various methods of treatment have been discussed with the patient and family. After consideration of risks, benefits and other options for treatment, the patient has consented to  Procedure(s): LEFT TOTAL KNEE ARTHROPLASTY (Left) as a surgical intervention .  The patient's history has been reviewed, patient examined, no change in status, stable for surgery.  I have reviewed the patient's chart and labs.  Questions were answered to the patient's satisfaction.     Johnn Hai

## 2013-12-06 NOTE — Brief Op Note (Signed)
12/06/2013  11:51 AM  PATIENT:  Kaitlin Watkins  68 y.o. female  PRE-OPERATIVE DIAGNOSIS:  DJD LEFT KNEE   POST-OPERATIVE DIAGNOSIS:  DJD LEFT KNEE   PROCEDURE:  Procedure(s): LEFT TOTAL KNEE ARTHROPLASTY (Left)  SURGEON:  Surgeon(s) and Role:    * Johnn Hai, MD - Primary  PHYSICIAN ASSISTANT:   ASSISTANTS: Bissell   ANESTHESIA:   general  EBL:  Total I/O In: 2000 [I.V.:2000] Out: 50 [Urine:50]  BLOOD ADMINISTERED:none  DRAINS: none   LOCAL MEDICATIONS USED:  MARCAINE     SPECIMEN:  No Specimen  DISPOSITION OF SPECIMEN:  N/A  COUNTS:  YES  TOURNIQUET:  * Missing tourniquet times found for documented tourniquets in log:  [id number] *  DICTATION: .Other Dictation: Dictation Number [id number]  PLAN OF CARE: Admit to inpatient   PATIENT DISPOSITION:  PACU - hemodynamically stable.   Delay start of Pharmacological VTE agent (>24hrs) due to surgical blood loss or risk of bleeding: no

## 2013-12-06 NOTE — Anesthesia Preprocedure Evaluation (Signed)
Anesthesia Evaluation  Patient identified by MRN, date of birth, ID band Patient awake    Reviewed: Allergy & Precautions, H&P , NPO status , Patient's Chart, lab work & pertinent test results  Airway Mallampati: II TM Distance: >3 FB Neck ROM: Full    Dental no notable dental hx.    Pulmonary neg pulmonary ROS, former smoker,  breath sounds clear to auscultation  Pulmonary exam normal       Cardiovascular hypertension, Pt. on medications Rhythm:Regular Rate:Normal     Neuro/Psych negative neurological ROS  negative psych ROS   GI/Hepatic negative GI ROS, Neg liver ROS,   Endo/Other  negative endocrine ROS  Renal/GU negative Renal ROS  negative genitourinary   Musculoskeletal negative musculoskeletal ROS (+)   Abdominal   Peds negative pediatric ROS (+)  Hematology negative hematology ROS (+)   Anesthesia Other Findings   Reproductive/Obstetrics negative OB ROS                           Anesthesia Physical Anesthesia Plan  ASA: II  Anesthesia Plan: General   Post-op Pain Management:    Induction: Intravenous  Airway Management Planned: Oral ETT  Additional Equipment:   Intra-op Plan:   Post-operative Plan: Extubation in OR  Informed Consent: I have reviewed the patients History and Physical, chart, labs and discussed the procedure including the risks, benefits and alternatives for the proposed anesthesia with the patient or authorized representative who has indicated his/her understanding and acceptance.   Dental advisory given  Plan Discussed with: CRNA and Surgeon  Anesthesia Plan Comments:         Anesthesia Quick Evaluation

## 2013-12-06 NOTE — Plan of Care (Signed)
Problem: Consults Goal: Diagnosis- Total Joint Replacement Left total knee     

## 2013-12-06 NOTE — H&P (View-Only) (Signed)
Kaitlin Watkins DOB: 09-08-1945 Married / Language: English / Race: White Female  H&P date 11/29/13  Chief Complaint: L knee pain  History of Present Illness The patient is a 68 year old female who comes in today for a preoperative History and Physical. The patient is scheduled for a left total knee arthroplasty to be performed by Dr. Johnn Hai, MD at [facility] on December 06, 2013. Kaitlin Watkins is c/o ongoing L knee pain due to DJD, refractory to arthroscopy, steroid injections, viscosupplementation, activity modifications, relative rest, bracing, NSAIDs, pain medicatons. She is severely disabled by this as opposed to mild pain, limiting ADL's.  Dr. Tonita Cong and the patient mutually agreed to proceed with a total knee replacement. Risks and benefits of the procedure were discussed including stiffness, suboptimal range of motion, persistent pain, infection requiring removal of prosthesis and reinsertion, need for prophylactic antibiotics in the future, for example, dental procedures, possible need for manipulation, revision in the future and also anesthetic complications including DVT, PE, etc. We discussed the perioperative course, time in the hospital, postoperative recovery and the need for elevation to control swelling. We also discussed the predicted range of motion and the probability that squatting and kneeling would be unobtainable in the future. In addition, postoperative anticoagulation was discussed. We have obtained preoperative medical clearance as necessary- Dr. Larene Beach and Dr. Beryle Beams. Provided her illustrated handout and discussed it in detail. They will enroll in the total joint replacement educational forum at the hospital.  Allergies Egg Influenza Virus Vaccine *VACCINES* Penicillin G Sodium *PENICILLINS*  Family History Osteoarthritis. mother, grandfather mothers side and grandmother fathers side Cerebrovascular Accident. First Degree Relatives. mother  and father Diabetes Mellitus. First Degree Relatives. mother and sister Heart disease in female family member before age 38 Heart disease in female family member before age 81 Cancer. mother Heart Disease. mother and brother Congestive Heart Failure. First Degree Relatives. mother First Degree Relatives  Social History Tobacco use. Former smoker. former smoker; smoke(d) less than 1/2 pack(s) per day, quit in 1967 Current work status. working full time Illicit drug use. no Children. 1 Exercise. Exercises rarely Alcohol use. current drinker; drinks wine; only occasionally per week Marital status. married Living situation. live with spouse; one floor home with no steps to enter Pain Contract. no Tobacco / smoke exposure. no Number of flights of stairs before winded. 2-3 Drug/Alcohol Rehab (Previously). no Drug/Alcohol Rehab (Currently). no Post-Surgical Plans. home with HHPT  Medication History Lipitor ( Oral) Specific dose unknown - Active. CoQ10 ( Oral) Specific dose unknown - Active. Dexilant (60MG  Capsule DR, Oral) Active. Calcium Carbonate ( Oral) Specific dose unknown - Active. Hydrocodone-Acetaminophen (7.5-325MG  Tablet, Oral) Active. TraMADol HCl (50MG  Tablet, Oral) Active. Mobic (15MG  Tablet, Oral) Active. Calcium Citrate (200MG  Tablet, 1 (one) Oral) Active. NexIUM ( Oral) Specific dose unknown - Active. Glucosamine Chondroitin Complx ( Oral) Active. Multiple Vitamin (1 (one) Oral) Active. Vitamin B Complex (1 (one) Oral) Active. Fish Oil Concentrate (435MG  Capsule, 1 (one) Oral) Active. Benicar HCT (20-12.5MG  Tablet, Oral) Active. Medications Reconciled.  Pregnancy / Birth History Pregnant. no  Past Surgical History Carpal Tunnel Repair. left Breast Biopsy. left Arthroscopy of Knee. left Tonsillectomy Hysterectomy. partial (non-cancerous) Straighten Nasal Septum Appendectomy Other Orthopaedic Surgery. left foot tumor removal  1995 Arthroscopic Knee Surgery - Right  Past Medical Hx Blood Clot Skin Cancer Gastroesophageal Reflux Disease Osteoarthritis Hypercholesterolemia Osteoporosis Hypertension Phlebitis Varicose veins Hemorrhoids Urinary Incontinence. frequency Cystitis Mumps Measles Streptococcus Infections Staphlococcus Infections Menopause  Review of Systems  General:Present- Night Sweats. Not Present- Chills, Fever, Fatigue, Weight Gain, Weight Loss and Memory Loss. Skin:Not Present- Hives, Itching, Rash, Eczema and Lesions. HEENT:Not Present- Tinnitus, Headache, Double Vision, Visual Loss, Hearing Loss and Dentures. Respiratory:Present- Shortness of breath with exertion and Chronic Cough (secondary to sinus issues). Not Present- Shortness of breath at rest, Allergies and Coughing up blood. Cardiovascular:Not Present- Chest Pain, Racing/skipping heartbeats, Difficulty Breathing Lying Down, Murmur, Swelling and Palpitations. Gastrointestinal:Not Present- Bloody Stool, Heartburn, Abdominal Pain, Vomiting, Nausea, Constipation, Diarrhea, Difficulty Swallowing, Jaundice and Loss of appetitie. Female Genitourinary:Present- Urinary frequency. Not Present- Blood in Urine, Weak urinary stream, Discharge, Flank Pain, Incontinence, Painful Urination, Urgency, Urinary Retention and Urinating at Night. Musculoskeletal:Present- Joint Stiffness, Joint Swelling and Joint Pain. Not Present- Muscle Weakness, Muscle Pain, Back Pain, Morning Stiffness and Spasms. Neurological:Not Present- Tremor, Dizziness, Blackout spells, Paralysis, Difficulty with balance and Weakness. Psychiatric:Present- Insomnia.  Physical Exam The physical exam findings are as follows:  General Mental Status - Alert, cooperative and good historian. General Appearance- pleasant. Not in acute distress. Orientation- Oriented X3. Build & Nutrition- Well nourished and Well developed.  Head and Neck Head- normocephalic,  atraumatic . Neck Global Assessment- supple. no bruit auscultated on the right and no bruit auscultated on the left.  Eye Pupil- Bilateral- Regular and Round. Motion- Bilateral- EOMI.  Chest and Lung Exam Auscultation: Breath sounds:- clear at anterior chest wall and - clear at posterior chest wall. Adventitious sounds:- No Adventitious sounds.  Cardiovascular Auscultation:Rhythm- Regular rate and rhythm. Heart Sounds- S1 WNL and S2 WNL. Murmurs & Other Heart Sounds:Auscultation of the heart reveals - No Murmurs.  Abdomen Palpation/Percussion:Tenderness- Abdomen is non-tender to palpation. Rigidity (guarding)- Abdomen is soft. Auscultation:Auscultation of the abdomen reveals - Bowel sounds normal.  Female Genitourinary Not done, not pertinent to present illness  Musculoskeletal Tender at the medial joint line. Patellofemoral pain with compression. Range of motion is -5-90 on her knee. There is some varus instability noted. No DVT. Knee exam on inspection reveals no evidence of soft tissue swelling, ecchymosis, deformity or erythema. On palpation there is no tenderness in the lateral joint line. Nontender over the fibular head or the peroneal nerve. Nontender over the quadriceps insertion of the patellar ligament insertion. The range of motion was full. Provocative maneuvers revealed a negative Lachman, negative anterior and posterior drawer and a negative McMurray. No instability was noted with valgus stressing at 0 or 30 degrees. On manual motor test the quadriceps and hamstrings were 5/5. Sensory exam was intact to light touch.  Imaging AP standing and lateral xrays demonstrate bone-on-bone arthrosis of the medial compartment of patellofemoral joint  Assessment & Plan L knee DJD  Pt with severe L knee DJD, end-stage, bone-on-bone, refractory to conservative tx, scheduled for L TKA by Dr. Tonita Cong on 12/06/13. 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 She has been cleared for surgery. Again discussed surgery itself as well as risks, complications and alternatives, including but not limited to DVT, PE, infx, bleeding, failure of procedure, need for secondary procedure including manipulation, nerve injury, ongoing pain/symptoms, anesthesia risk, even stroke or death. Also discussed typical post-op protocols, activity restrictions, need for PT, flexion/extension exercises, time out of work. Discussed need for DVT ppx post-op with Xarelto then ASA per protocol. Discussed dental ppx. Also discussed limitations post-operatively such as kneeling and squatting. All questions were answered. Patient desires to proceed with surgery as scheduled. She will remain NPO after MN night before surgery.  Will hold ASA, vitamins, NSAIDs, supplements accordingly. She did have one prior incidence of PONV, will plan IV tylenol, exparil to cut back on narcotic use peri-op. She has a hx of PCN reaction- rash only. Will increase strength of her Norco now and plan Percocet for pain post-op. Plan Xarelto post-op, likely for 3 weeks given her prior hx of DVT. She will follow up 10-14 days post-op for suture removal and will call with any questions or concerns in the interim.  Plan Left total knee replacement  Signed electronically by Cecilie Kicks, PA-C for Dr. Tonita Cong

## 2013-12-07 LAB — CBC
HCT: 36.5 % (ref 36.0–46.0)
Hemoglobin: 11.9 g/dL — ABNORMAL LOW (ref 12.0–15.0)
MCH: 29.1 pg (ref 26.0–34.0)
MCHC: 32.6 g/dL (ref 30.0–36.0)
MCV: 89.2 fL (ref 78.0–100.0)
PLATELETS: 176 10*3/uL (ref 150–400)
RBC: 4.09 MIL/uL (ref 3.87–5.11)
RDW: 13.4 % (ref 11.5–15.5)
WBC: 10.6 10*3/uL — AB (ref 4.0–10.5)

## 2013-12-07 LAB — BASIC METABOLIC PANEL
BUN: 9 mg/dL (ref 6–23)
CALCIUM: 8.7 mg/dL (ref 8.4–10.5)
CHLORIDE: 103 meq/L (ref 96–112)
CO2: 26 mEq/L (ref 19–32)
Creatinine, Ser: 0.67 mg/dL (ref 0.50–1.10)
GFR calc non Af Amer: 88 mL/min — ABNORMAL LOW (ref >90)
Glucose, Bld: 125 mg/dL — ABNORMAL HIGH (ref 70–99)
Potassium: 4 mEq/L (ref 3.7–5.3)
SODIUM: 141 meq/L (ref 137–147)

## 2013-12-07 NOTE — Op Note (Signed)
NAMEMAILEN, NEWBORN NO.:  [id number]  MEDICAL RECORD NO.:  [id number]  LOCATION:  40                         FACILITY:  [facility]  PHYSICIAN:  Susa Day, M.D.    DATE OF BIRTH:  18-Nov-1945  DATE OF PROCEDURE:  12/06/2013 DATE OF DISCHARGE:                              OPERATIVE REPORT   PREOPERATIVE DIAGNOSIS:  End-stage degenerative joint disease of the left knee.  POSTOPERATIVE DIAGNOSIS:  End-stage degenerative joint disease of the left knee.  PROCEDURE PERFORMED:  Left total knee arthroplasty.  COMPONENTS:  DePuy rotating platform, 4 femur, 3 tibia, 12.5 insert, 35 patella.  HISTORY:  This is a 68 year old with end-stage bone-on-bone arthrosis of the left knee refractory to conservative treatment disabling to her activities of daily living, indicated for replacement of degenerated joint.  Risk and benefits discussed including bleeding, infection, damage to neurovascular structures, suboptimal range of motion, DVT, PE, and anesthetic complications, etc.  TECHNIQUE:  With the patient in supine position, after induction of adequate general anesthesia, 2 g Kefzol, left lower extremity was prepped, draped and exsanguinated in usual sterile fashion.  Thigh tourniquet was inflated to 300 mmHg.  Midline incision was made based over the patella.  Full-thickness flaps developed.  Median parapatellar arthrotomy was performed.  Patella was everted.  The knee was flexed. Tricompartmental osteoarthrosis was noted particularly at the medial compartment bone-on-bone.  Medial and lateral menisci remnants were removed as was the ACL as were bone spurs.  We elevated the soft tissue medially preserving the MCL attachment.  Step drill was utilized to enter the femoral canal was irrigated.  The T-handle reamer placed, 5- degree intramedullary guide placed, 10 off the distal femur and distal femoral cuts were performed.  We then sized the distal femur of the anterior  cortex to a 4, this was then pinned in 3 degrees of external rotation and anterior and posterior chamfer cuts were performed.  Soft tissues protected.  Attention turned towards the tibia was subluxed, the external alignment guide was utilized bisecting the ankle parallel to the tibia.  Slight slope 10 off the high side, which was lateral, 4 off the medial side, it was pinned to oscillating saw.  We performed the tibial cut.  We then tried our flexion and extension gaps, and they were equivalent.  We then turned our attention towards completing the tibia, it was sized to a 3 just to the medial aspect of the tibial tubercle pinned.  Drilled centrally and a pin guide placed.  We then completed the femoral box cut with our distal femoral box cut jig.  Saw was used to perform that.  It was then rasped and a trial femur and tibia was then placed with a 10 mm insert.  We had full extension, slight hyperextension, and full flexion. Patella was measured at 22.  We used a patellar jig and planed 7.5 off the patella, and final measurement was 14-15.  We then drilled our PEG holes which were medialized for a 35 and placed a trial patella with excellent patellofemoral tracking.  All instrumentation was then removed.  We checked posteriorly any remnants of menisci were removed. The popliteus was intact.  We cauterized the  geniculate.  We copiously irrigated the wound with pulsatile lavage and flexed the knee.  All surfaces were thoroughly dried and mixed the cement on the back table. We injected the cement under pressure in the tibial canal, proximal tibia, and impacted 3 permanent tray.  Redundant cement removed.  We cemented the femur, impacted it, and redundant cement removed.  Placed a trial 12.5, reduced it and held an axial load throughout the curing of the cement.  With redundant cement removed, we cemented and clamped the patella as well.  We copiously irrigated the wound with  antibiotic irrigation after curing of the cement, we had full extension and flexion with excellent stability.  We chose a 12.5 insert and this was then removed.  We meticulously removed redundant cement.  Excellent cement mantle was noted.  After copious irrigation and subluxing of the tibia, we placed 12.5 insert, reduced it.  We had full extension, full flexion, and good stability with varus valgus stressing 0-30 degrees.  Excellent patellofemoral tracking.  Negative anterior drawer.  I then removed the trial.  There was a permanent insert.  Following this, we placed Hemovac and brought it out through a lateral stab wound of the skin.  We repaired the patellar arthrotomy and approximated with 1 Vicryl and then oversewn with a running V-Loc.  Subcu with 2-0 and skin with subcuticular Monocryl.  Sterile dressing was applied.  Prior to that, we had flexion to gravity at 90 degrees and excellent patellofemoral tracking, and normal and good stability of varus valgus stressing at 0- 30 degrees.  Again, sterile dressing was applied.  The tourniquet was deflated and there was adequate revascularization of lower extremity appreciated.  The patient tolerated the procedure well.  No complications.  Assistant, Cleophas Dunker, [place], was used throughout the case for assisting, closure, and exposure.     Susa Day, M.D.     Geralynn Rile  D:  12/06/2013  T:  12/07/2013  Job:  [id number]

## 2013-12-07 NOTE — Clinical Social Work Note (Signed)
Referred to this CSW today for ?SNF. Chart reviewed and have spoken with [facility] and Care Coordinator who indicate patient plans to d/c home with [facility] and DME. CSW to sign off- please contact us if SW needs arise. Eduard Clos, MSW, [place]

## 2013-12-07 NOTE — Progress Notes (Signed)
Physical Therapy Treatment Patient Details Name: Kaitlin Watkins MRN: [id number] DOB: 08-03-1945 Today's Date: 12/07/2013    History of Present Illness Pt is a 68 year old female s/p L TKA.    PT Comments    POD # 1 pm session.  Applied KI and instructed on use for amb.  Assisted OOB to amb to BR to void then amb limited distance in hallway due to MAX c/o nausea/dizziness.  Recliner brought to pt and returned to room.  BP was 100/63.   Follow Up Recommendations  Home health PT     Equipment Recommendations  None recommended by PT    Recommendations for Other Services       Precautions / Restrictions Precautions Precautions: Knee Precaution Comments: Instructed pt on KI use for amb Required Braces or Orthoses: Knee Immobilizer - Left Restrictions Weight Bearing Restrictions: No Other Position/Activity Restrictions: WBAT    Mobility  Bed Mobility Overal bed mobility: Needs Assistance Bed Mobility: Supine to Sit     Supine to sit: Min guard     General bed mobility comments: pt able to assist her L LE over to EOB with increased time.  Transfers Overall transfer level: Needs assistance Equipment used: Rolling walker (2 wheeled) Transfers: Sit to/from Stand Sit to Stand: Min assist         General transfer comment: 50% verbal cues for hand placement and LE management.  Ambulation/Gait Ambulation/Gait assistance: Min guard;Min assist Ambulation Distance (Feet): 22 Feet Assistive device: Rolling walker (2 wheeled) Gait Pattern/deviations: Step-to pattern;Decreased stance time - left;Trunk flexed Gait velocity: decreased   General Gait Details: pt amb around bed to BR then out of BR to hallway limited distance due to max c/o feeling nausea/dizziness.  Recliner brought to pt.  BP in recliner was 100/63.  at this point RN present.   Stairs            Wheelchair Mobility    Modified Rankin (Stroke Patients Only)       Balance                                    Cognition Arousal/Alertness: Awake/alert Behavior During Therapy: WFL for tasks assessed/performed Overall Cognitive Status: Within Functional Limits for tasks assessed                      Exercises      General Comments        Pertinent Vitals/Pain C/o 8/10 pain ICE applied    Home Living Family/patient expects to be discharged to:: Private residence Living Arrangements: Spouse/significant other   Type of Home: House Home Access: Stairs to enter Entrance Stairs-Rails: None Home Layout: Other (Comment) (one step in home ) Home Equipment: Walker - 2 wheels;Cane - single point;Bedside commode;Tub bench      Prior Function Level of Independence: Independent          PT Goals (current goals can now be found in the care plan section) Acute Rehab PT Goals Patient Stated Goal: home Progress towards PT goals: Progressing toward goals    Frequency  7X/week    PT Plan      Co-evaluation             End of Session Equipment Utilized During Treatment: Gait belt;Left knee immobilizer Activity Tolerance: Patient limited by fatigue;Patient limited by pain;Other (comment) (nausea) Patient left: in chair;with call bell/phone within reach  Time: 3382-5053 PT Time Calculation (min): 29 min  Charges:  $Gait Training: 8-22 mins $Therapeutic Activity: 8-22 mins                    G Codes:      Rica Koyanagi  PTA [facility]  Acute  Rehab Pager      [phone]
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
## 2013-12-07 NOTE — Discharge Instructions (Signed)
Elevate leg above heart 6x a day for 6minutes each Use knee immobilizer while walking until can SLR x 10 Use knee immobilizer in bed to keep knee in extensio Aquacel dressing may remain in place for 7 days. May shower with aquacel dressing in place. After 7 days, remove aquacel dressing. Do not remove steri-strips if they are present. Place new dressing with gauze and tape or ACE bandage which should be kept clean and dry and changed daily.   Information on my medicine - XARELTO (Rivaroxaban)  This medication education was reviewed with me or my healthcare representative as part of my discharge preparation.  The pharmacist that spoke with me during my hospital stay was:  Randa Spike, [place]  Why was Xarelto prescribed for you? Xarelto was prescribed for you to reduce the risk of blood clots forming after orthopedic surgery. The medical term for these abnormal blood clots is venous thromboembolism (VTE).  What do you need to know about xarelto ? Take your Xarelto ONCE DAILY at the same time every day. You may take it either with or without food.  If you have difficulty swallowing the tablet whole, you may crush it and mix in applesauce just prior to taking your dose.  Take Xarelto exactly as prescribed by your doctor and DO NOT stop taking Xarelto without talking to the doctor who prescribed the medication.  Stopping without other VTE prevention medication to take the place of Xarelto may increase your risk of developing a clot.  After discharge, you should have regular check-up appointments with your healthcare provider that is prescribing your Xarelto.    What do you do if you miss a dose? If you miss a dose, take it as soon as you remember on the same day then continue your regularly scheduled once daily regimen the next day. Do not take two doses of Xarelto on the same day.   Important Safety Information A possible side effect of Xarelto is bleeding. You should call your  healthcare provider right away if you experience any of the following:   Bleeding from an injury or your nose that does not stop.   Unusual colored urine (red or dark brown) or unusual colored stools (red or black).   Unusual bruising for unknown reasons.   A serious fall or if you hit your head (even if there is no bleeding).  Some medicines may interact with Xarelto and might increase your risk of bleeding while on Xarelto. To help avoid this, consult your healthcare provider or pharmacist prior to using any new prescription or non-prescription medications, including herbals, vitamins, non-steroidal anti-inflammatory drugs (NSAIDs) and supplements.  This website has more information on Xarelto: [email or web address]

## 2013-12-07 NOTE — Evaluation (Signed)
Physical Therapy Evaluation Patient Details Name: Kaitlin Watkins MRN: [id number] DOB: May 10, 1946 Today's Date: 12/07/2013   History of Present Illness  Pt is a 68 year old female s/p L TKA.  Clinical Impression  Pt is s/p L TKA resulting in the deficits listed below (see PT Problem List).  Pt will benefit from skilled PT to increase their independence and safety with mobility to allow discharge to the venue listed below.  Pt mobilizing well POD #1 and plans to d/c home with spouse.  Pt reports having all DME due to spouse's previous medical hx.     Follow Up Recommendations Home health PT    Equipment Recommendations  None recommended by PT    Recommendations for Other Services       Precautions / Restrictions Precautions Precautions: Knee Required Braces or Orthoses: Knee Immobilizer - Left Restrictions Other Position/Activity Restrictions: WBAT      Mobility  Bed Mobility Overal bed mobility: Needs Assistance Bed Mobility: Supine to Sit     Supine to sit: Min assist     General bed mobility comments: verbal cues for technique, assist for L LE  Transfers Overall transfer level: Needs assistance Equipment used: Rolling walker (2 wheeled) Transfers: Sit to/from Stand Sit to Stand: Min assist         General transfer comment: verbal cues for safe technique, assist to rise and control descent  Ambulation/Gait Ambulation/Gait assistance: Min assist;Min guard Ambulation Distance (Feet): 50 Feet Assistive device: Rolling walker (2 wheeled) Gait Pattern/deviations: Step-to pattern;Antalgic;Decreased stance time - left     General Gait Details: pt ambulated 5 feet forward and then backward to recliner as she did not like socks and performed to don shoes, then ambulated 50 feet in hallway, verbal cues for safety, sequence, step length, RW distance  Stairs            Wheelchair Mobility    Modified Rankin (Stroke Patients Only)       Balance                                              Pertinent Vitals/Pain Premedicated, activity to tolerance, repositioned    Home Living Family/patient expects to be discharged to:: Private residence Living Arrangements: Spouse/significant other   Type of Home: House Home Access: Stairs to enter Entrance Stairs-Rails: None Entrance Stairs-Number of Steps: 1 Home Layout: Other (Comment) (one step in home ) Home Equipment: Walker - 2 wheels;Cane - single point;Bedside commode      Prior Function Level of Independence: Independent               Hand Dominance        Extremity/Trunk Assessment               Lower Extremity Assessment: LLE deficits/detail   LLE Deficits / Details: poor quad contraction, unable to perform SLR, maintained KI     Communication   Communication: No difficulties  Cognition Arousal/Alertness: Awake/alert Behavior During Therapy: WFL for tasks assessed/performed Overall Cognitive Status: Within Functional Limits for tasks assessed                      General Comments      Exercises        Assessment/Plan    PT Assessment Patient needs continued PT services  PT Diagnosis Difficulty walking;Acute pain  PT Problem List Decreased strength;Decreased range of motion;Decreased mobility;Decreased knowledge of precautions;Decreased knowledge of use of DME;Pain  PT Treatment Interventions Functional mobility training;Stair training;Gait training;DME instruction;Patient/family education;Therapeutic activities;Therapeutic exercise   PT Goals (Current goals can be found in the Care Plan section) Acute Rehab PT Goals PT Goal Formulation: With patient Time For Goal Achievement: 12/11/13 Potential to Achieve Goals: Good    Frequency 7X/week   Barriers to discharge        Co-evaluation               End of Session Equipment Utilized During Treatment: Gait belt;Left knee immobilizer Activity Tolerance: Patient  limited by fatigue;Patient limited by pain Patient left: in chair;with call bell/phone within reach           Time: 0921-0946 PT Time Calculation (min): 25 min   Charges:   PT Evaluation $Initial PT Evaluation Tier I: 1 Procedure PT Treatments $Gait Training: 23-37 mins   PT G CodesJunius Argyle 12/07/2013, 10:45 AM Carmelia Bake, PT, DPT 12/07/2013 Pager: [phone]

## 2013-12-07 NOTE — Evaluation (Signed)
Occupational Therapy Evaluation Patient Details Name: Kaitlin Watkins MRN: [id number] DOB: 14-Mar-1946 Today's Date: 12/07/2013    History of Present Illness Pt is a 68 year old female s/p L TKA.   Clinical Impression   Pt is currently transferring on and off the 3in1 with min assist. Pt will benefit from continued OT services to improve safety and independence with self care tasks for d/c home with spouse.    Follow Up Recommendations  No OT follow up;Supervision/Assistance - 24 hour    Equipment Recommendations  None recommended by OT    Recommendations for Other Services       Precautions / Restrictions Precautions Precautions: Knee Required Braces or Orthoses: Knee Immobilizer - Left Restrictions Weight Bearing Restrictions: No Other Position/Activity Restrictions: WBAT      Mobility Bed Mobility Overal bed mobility: Needs Assistance Bed Mobility: Supine to Sit     Supine to sit: Min guard     General bed mobility comments: pt able to assist her L LE over to EOB and back onto bed.  Transfers Overall transfer level: Needs assistance Equipment used: Rolling walker (2 wheeled) Transfers: Sit to/from Stand Sit to Stand: Min assist         General transfer comment: verbal cues for hand placement and LE management.    Balance                                            ADL Overall ADL's : Needs assistance/impaired Eating/Feeding: Independent;Sitting   Grooming: Wash/dry hands;Set up;Sitting   Upper Body Bathing: Set up;Sitting   Lower Body Bathing: Minimal assistance;Sit to/from stand   Upper Body Dressing : Set up;Sitting   Lower Body Dressing: Minimal assistance;Sit to/from stand   Toilet Transfer: Minimal assistance;Ambulation;BSC;RW   Toileting- Clothing Manipulation and Hygiene: Minimal assistance;Sit to/from stand         General ADL Comments: Pt moves a little quickly at times. She needs cues throughout session for  proper hand placement and LE management as well as for distance of walker to self. She tends to step too close to the walker. Husband present and has some back issues so he is limited with being able to bend down. She is able to reach bilateral foot for LB dressing. Reviewed sequence for LB dressing.      Vision                     Perception     Praxis      Pertinent Vitals/Pain 6/10 with activity at the highest. No complaint of pain once in bed and repositioned. Placed ice pack.     Hand Dominance     Extremity/Trunk Assessment Upper Extremity Assessment Upper Extremity Assessment: Overall WFL for tasks assessed          Communication Communication Communication: No difficulties   Cognition Arousal/Alertness: Awake/alert Behavior During Therapy: WFL for tasks assessed/performed Overall Cognitive Status: Within Functional Limits for tasks assessed                     General Comments       Exercises       Shoulder Instructions      Home Living Family/patient expects to be discharged to:: Private residence Living Arrangements: Spouse/significant other   Type of Home: House Home Access: Stairs to enter [employer] of Steps:  1 Entrance Stairs-Rails: None Home Layout: Other (Comment) (one step in home )     Bathroom Shower/Tub: Teacher, early years/pre: Standard     Home Equipment: Environmental consultant - 2 wheels;Cane - single point;Bedside commode;Tub bench          Prior Functioning/Environment Level of Independence: Independent             OT Diagnosis: Generalized weakness   OT Problem List: Decreased strength;Decreased knowledge of use of DME or AE   OT Treatment/Interventions: Self-care/ADL training;Patient/family education;Therapeutic activities;DME and/or AE instruction    OT Goals(Current goals can be found in the care plan section) Acute Rehab OT Goals Patient Stated Goal: home  OT Frequency: Min 2X/week    Barriers to D/C:            Co-evaluation              End of Session Equipment Utilized During Treatment: Gait belt;Rolling walker;Left knee immobilizer  Activity Tolerance: Patient tolerated treatment well Patient left: in bed;with call bell/phone within reach;with family/visitor present   Time: 6568-1275 OT Time Calculation (min): 22 min Charges:  OT General Charges $OT Visit: 1 Procedure OT Evaluation $Initial OT Evaluation Tier I: 1 Procedure OT Treatments $Therapeutic Activity: 8-22 mins G-Codes:    Alycia Patten Mancel Lardizabal [phone] 12/07/2013, 11:49 AM

## 2013-12-08 LAB — CBC
HEMATOCRIT: 32.6 % — AB (ref 36.0–46.0)
Hemoglobin: 10.9 g/dL — ABNORMAL LOW (ref 12.0–15.0)
MCH: 29.3 pg (ref 26.0–34.0)
MCHC: 33.4 g/dL (ref 30.0–36.0)
MCV: 87.6 fL (ref 78.0–100.0)
PLATELETS: 173 10*3/uL (ref 150–400)
RBC: 3.72 MIL/uL — AB (ref 3.87–5.11)
RDW: 13.7 % (ref 11.5–15.5)
WBC: 10 10*3/uL (ref 4.0–10.5)

## 2013-12-08 MED ORDER — POLYETHYLENE GLYCOL 3350 17 G PO PACK
17.0000 g | PACK | Freq: Every day | ORAL | Status: DC
Start: 2013-12-08 — End: 2013-12-10
  Administered 2013-12-08 – 2013-12-10 (×4): 17 g via ORAL

## 2013-12-08 MED ORDER — ALUM & MAG HYDROXIDE-SIMETH 200-200-20 MG/5ML PO SUSP
30.0000 mL | ORAL | Status: DC | PRN
  Administered 2013-12-08: 30 mL via ORAL
  Filled 2013-12-08: qty 30

## 2013-12-08 NOTE — Progress Notes (Signed)
CARE MANAGEMENT NOTE 12/08/2013  Patient:  Kaitlin Watkins, Kaitlin Watkins   Account Number:  [id number]  Date Initiated:  12/06/2013  Documentation initiated by:  DAVIS,RHONDA  Subjective/Objective Assessment:   pt with djd of the knee requiring tkr     Action/Plan:   home when stable   Anticipated DC Date:  12/09/2013   Anticipated DC Plan:  [place]  In-house referral  NA      DC Planning Services  CM consult      [facility] Choice  HOME HEALTH   Choice offered to / List presented to:  C-1 Patient        [place] arranged  HH-2 PT      [place].   Status of service:  Completed, signed off Medicare Important Message given?   (If response is "NO", the following Medicare IM given date fields will be blank) Date Medicare IM given:   Date Additional Medicare IM given:    Discharge Disposition:  [place]  Per UR Regulation:  Reviewed for med. necessity/level of care/duration of stay  If discussed at Secretary of Stay Meetings, dates discussed:    Comments:  12/08/2013 1420 NCM spoke to pt and offered choice for [facility]. Pt agreeable to [facility] for HH. Notified [facility] for [place]. Pt states she has RW and shower chair at home. Jonnie Finner RN CCM Case Mgmt phone [phone]   [facility]

## 2013-12-08 NOTE — Progress Notes (Signed)
Occupational Therapy Treatment Patient Details Name: KENLY HENCKEL MRN: [id number] DOB: [birth year] Today's Date: 12/08/2013    History of present illness Pt is a 68 year old female s/p L TKA.   OT comments  Pt making excellent progress: all acute goals have been met  Follow Up Recommendations  No OT follow up;Supervision/Assistance - 24 hour    Equipment Recommendations  None recommended by OT    Recommendations for Other Services      Precautions / Restrictions Precautions Precautions: Knee Precaution Comments: Instructed pt on KI use for amb Required Braces or Orthoses: Knee Immobilizer - Left Restrictions Weight Bearing Restrictions: No Other Position/Activity Restrictions: WBAT       Mobility Bed Mobility                  Transfers     Transfers: Sit to/from Stand Sit to Stand: Min guard         General transfer comment: min cues for first sit to stand then none needed    Balance                                   ADL Overall ADL's : Needs assistance/impaired                         Toilet Transfer: Min guard;Ambulation;Comfort height toilet;Grab bars   Toileting- Clothing Manipulation and Hygiene: Minimal assistance;Sit to/from stand   Tub/ Shower Transfer: Minimal assistance;Tub transfer;Tub bench     General ADL Comments: Pt ambulated to bathroom and practiced with tub bench in room.  Assisted with LLE and also showed her how to use sheet to self-assist as she was assisting by lifting at thigh.        Vision                     Perception     Praxis      Cognition   Behavior During Therapy: [facility] for tasks assessed/performed Overall Cognitive Status: Within Functional Limits for tasks assessed                       Extremity/Trunk Assessment               Exercises     Shoulder Instructions       General Comments      Pertinent Vitals/ Pain       L knee sore; repositioned.   Pt declined more ice  Home Living                                          Prior Functioning/Environment              Frequency       Progress Toward Goals  OT Goals(current goals can now be found in the care plan section)  Progress towards OT goals: Goals met/education completed, patient discharged from [place] of Session     Activity Tolerance Patient tolerated treatment well   Patient Left in chair;with call bell/phone within reach;with family/visitor present   Nurse Communication  Time: 1007-1219 OT Time Calculation (min): 26 min  Charges: OT General Charges $OT Visit: 1 Procedure OT Treatments $Self Care/Home Management : 23-37 mins  [facility] 12/08/2013, 11:48 AM  Lesle Chris, OTR/L [phone] 12/08/2013

## 2013-12-08 NOTE — Progress Notes (Signed)
Pt c/o "indigestion" unrelieved by Reglan 10 mg po. Denies pain or heaviness in chest. Will call MD on call for prn.

## 2013-12-08 NOTE — Progress Notes (Signed)
Placed EKG in chart for review..No change since EKG pre op.

## 2013-12-08 NOTE — Progress Notes (Signed)
Pt continues to c/o indigestion in spite of Maalox po ordered when MD called earlier. Will get VS and EKG. HOB 45 degrees. Pt belching frequently w/o relief

## 2013-12-08 NOTE — Progress Notes (Signed)
Physical Therapy Treatment Patient Details Name: Kaitlin Watkins MRN: [id number] DOB: 1945-08-19 Today's Date: 12/31/13    History of Present Illness Pt is a 68 year old female s/p L TKA.    PT Comments    Progressing   Follow Up Recommendations  Home health PT     Equipment Recommendations  None recommended by PT    Recommendations for Other Services       Precautions / Restrictions Precautions Precautions: Knee Precaution Comments: Instructed pt on KI use for amb Required Braces or Orthoses: Knee Immobilizer - Left Knee Immobilizer - Left: Discontinue once straight leg raise with < 10 degree lag Restrictions Other Position/Activity Restrictions: WBAT    Mobility  Bed Mobility Overal bed mobility: Needs Assistance Bed Mobility: Sit to Supine       Sit to supine: Min assist   General bed mobility comments: assist with LLE, incr time, cues for technique  Transfers Overall transfer level: Needs assistance Equipment used: Rolling walker (2 wheeled) Transfers: Sit to/from Stand Sit to Stand: Min guard;Min assist         General transfer comment: min cues and slight assist for wt shift  Ambulation/Gait Ambulation/Gait assistance: Min guard Ambulation Distance (Feet): 52 Feet Assistive device: Rolling walker (2 wheeled) Gait Pattern/deviations: Step-to pattern;Antalgic;Trunk flexed;Decreased stance time - left Gait velocity: decreased   General Gait Details: cues for sequence, RW position, use of UEs   Stairs            Wheelchair Mobility    Modified Rankin (Stroke Patients Only)       Balance                                    Cognition Arousal/Alertness: Awake/alert Behavior During Therapy: WFL for tasks assessed/performed Overall Cognitive Status: Within Functional Limits for tasks assessed                      Exercises Total Joint Exercises Ankle Circles/Pumps: AROM;Both;10 reps Quad Sets:  AROM;Strengthening;Both;10 reps Heel Slides: Left;10 reps;AAROM Hip ABduction/ADduction: AAROM;Left;10 reps Straight Leg Raises: AAROM;Left;10 reps    General Comments        Pertinent Vitals/Pain Pain better controlled today    Home Living                      Prior Function            PT Goals (current goals can now be found in the care plan section) Acute Rehab PT Goals Patient Stated Goal: home Time For Goal Achievement: 12/11/13 Potential to Achieve Goals: Good Progress towards PT goals: Progressing toward goals    Frequency  7X/week    PT Plan Current plan remains appropriate    Co-evaluation             End of Session Equipment Utilized During Treatment: Gait belt;Left knee immobilizer Activity Tolerance: Patient tolerated treatment well Patient left: in bed;with call bell/phone within reach     Time: 1145-1210 PT Time Calculation (min): 25 min  Charges:  $Gait Training: 8-22 mins $Therapeutic Exercise: 8-22 mins                    G Codes:      Neil Crouch 12/31/2013, 1:30 PM

## 2013-12-08 NOTE — Progress Notes (Signed)
Subjective: 2 Days Post-Op Procedure(s) (LRB): LEFT TOTAL KNEE ARTHROPLASTY (Left) Patient reports pain as moderate to left knee. Tolerating PO's well. Progressing with PT. Soreness in calf region. Denies CP or SOB. Objective: Vital signs in last 24 hours: Temp:  [96.5 F (35.8 C)-101.6 F (38.7 C)] 99 F (37.2 C) (06/06 0646) Pulse Rate:  [68-99] 99 (06/06 0500) Resp:  [16-18] 18 (06/06 0500) BP: (99-115)/(61-73) 115/73 mmHg (06/06 0500) SpO2:  [94 %-100 %] 94 % (06/06 0500)  Intake/Output from previous day: 06/05 0701 - 06/06 0700 In: 480 [P.O.:480] Out: 2900 [Urine:2900] Intake/Output this shift:     Recent Labs  12/07/13 0427 12/08/13 0530  HGB 11.9* 10.9*    Recent Labs  12/07/13 0427 12/08/13 0530  WBC 10.6* 10.0  RBC 4.09 3.72*  HCT 36.5 32.6*  PLT 176 173    Recent Labs  12/07/13 0427  NA 141  K 4.0  CL 103  CO2 26  BUN 9  CREATININE 0.67  GLUCOSE 125*  CALCIUM 8.7   No results found for this basename: LABPT, INR,  in the last 72 hours  Alert and oriented x3. RRR, Lungs clear, BS x4. Left Calf soft and non tender. L knee dressing D/I. No DVT signs. No signs of infection or compartment syndrome. LLE neurovascularly intact.   Assessment/Plan: 2 Days Post-Op Procedure(s) (LRB): LEFT TOTAL KNEE ARTHROPLASTY (Left) Up with PT Plan D/c home Tomorrow Leave Aquacel in place Continue other current care Well keep another night for pain control.  Ace wrap d/c'ed  Bryson L Stilwell 12/08/2013, 8:25 AM

## 2013-12-09 LAB — CBC
HCT: 29.7 % — ABNORMAL LOW (ref 36.0–46.0)
HEMOGLOBIN: 9.9 g/dL — AB (ref 12.0–15.0)
MCH: 29.6 pg (ref 26.0–34.0)
MCHC: 33.3 g/dL (ref 30.0–36.0)
MCV: 88.7 fL (ref 78.0–100.0)
Platelets: 167 10*3/uL (ref 150–400)
RBC: 3.35 MIL/uL — ABNORMAL LOW (ref 3.87–5.11)
RDW: 13.8 % (ref 11.5–15.5)
WBC: 7.5 10*3/uL (ref 4.0–10.5)

## 2013-12-09 MED ORDER — POLYETHYLENE GLYCOL 3350 17 G PO PACK: 17.0000 g | PACK | Freq: Every day | ORAL | Status: DC

## 2013-12-09 MED ORDER — SODIUM CHLORIDE 0.9 % IV BOLUS (SEPSIS)
500.0000 mL | Freq: Once | INTRAVENOUS | Status: AC
  Administered 2013-12-09: 500 mL via INTRAVENOUS

## 2013-12-09 NOTE — Progress Notes (Signed)
Physical Therapy Treatment Patient Details Name: Kaitlin Watkins MRN: [id number] DOB: 1946-04-09 Today's Date: 12/09/2013    History of Present Illness Pt is a 68 year old female s/p L TKA.    PT Comments    POD # 4 progressing slowly.  Assisted OOB to amb to BR pt took an extra amountof time with difficulty weightbearing thru L LE due to MAX c/o pain despite being pre medicated.  Pt c/o feeling "funny" with increased paleness.  BP standing was 76/50.  Pt drifty and assited to recliner immediately.  Reported to RN we were unable to finish her session and unable to attempt amb in hallway to practice one step she has to enter her home.    Follow Up Recommendations  Home health PT     Equipment Recommendations  None recommended by PT    Recommendations for Other Services       Precautions / Restrictions Precautions Precautions: Knee Precaution Comments: PIOD # 4 so did not use to amb pt to BR Restrictions Weight Bearing Restrictions: No Other Position/Activity Restrictions: WBAT    Mobility  Bed Mobility Overal bed mobility: Needs Assistance Bed Mobility: Supine to Sit     Supine to sit: Min assist     General bed mobility comments: assist with LLE, incr time, cues for technique  Transfers Overall transfer level: Needs assistance Equipment used: Rolling walker (2 wheeled)   Sit to Stand: Supervision;Min guard         General transfer comment: required increased time and 50% VC's on proper hand placement.  Assisted OOB and on/off toilet then to recliner.  Pt required the same cueing each time.    Ambulation/Gait Ambulation/Gait assistance: Min guard Ambulation Distance (Feet): 9 Feet (to and from BR only ) Assistive device: Rolling walker (2 wheeled) Gait Pattern/deviations: Step-to pattern;Decreased step length - left;Trunk flexed Gait velocity: decreased   General Gait Details: cues for sequence, RW position, use of UEs.  Pt very slow and required repeat  cueing esp with turn completion.  Pt c/o MAX pain and feeling "funny".  BP standing was 76/50.  Reported to RN.   Stairs Stairs:  (unable to perform)          Wheelchair Mobility    Modified Rankin (Stroke Patients Only)       Balance                                    Cognition                            Exercises      General Comments        Pertinent Vitals/Pain C/o 10/10 pain despite premedicated    Home Living                      Prior Function            PT Goals (current goals can now be found in the care plan section) Progress towards PT goals: Progressing toward goals    Frequency  7X/week    PT Plan      Co-evaluation             End of Session Equipment Utilized During Treatment: Gait belt Activity Tolerance: Treatment limited secondary to medical complications (Comment) (HYPOTENTION) Patient left: in chair;with call bell/phone within reach;with  family/visitor present     Time: 1005-1045 PT Time Calculation (min): 40 min  Charges:  $Gait Training: 8-22 mins $Therapeutic Activity: 23-37 mins                    G Codes:      Rica Koyanagi  PTA [facility]  Acute  Rehab Pager      [phone]

## 2013-12-09 NOTE — Progress Notes (Signed)
Physical Therapy Treatment Patient Details Name: Kaitlin Watkins MRN: [id number] DOB: 14-May-1946 Today's Date: 12/09/2013    History of Present Illness Pt is a 68 year old female s/p L TKA.    PT Comments    Pt received bolus IV.  Assisted out of recliner to amb in hallway BP better at 107/70.  Pt feeling better.  Tolerated session well with no c/o's.  Assisted back to bed for CPM.  Follow Up Recommendations  Home health PT     Equipment Recommendations  None recommended by PT    Recommendations for Other Services       Precautions / Restrictions Precautions Precautions: Knee Precaution Comments: PIOD # 4 so did not use to amb pt to BR Restrictions Weight Bearing Restrictions: No Other Position/Activity Restrictions: WBAT    Mobility  Bed Mobility Overal bed mobility: Needs Assistance Bed Mobility: Supine to Sit     Supine to sit: Min assist     General bed mobility comments: assist with LLE, incr time, cues for technique  Transfers Overall transfer level: Needs assistance Equipment used: Rolling walker (2 wheeled)   Sit to Stand: Supervision;Min guard         General transfer comment: required increased time and 25% VC's on proper hand placement.    Ambulation/Gait Ambulation/Gait assistance: Min guard Ambulation Distance (Feet): 65Feet  Assistive device: Rolling walker (2 wheeled) Gait Pattern/deviations: Step-to pattern;Decreased step length - left;Trunk flexed Gait velocity: decreased    Stairs Stairs:  (unable to perform)          Wheelchair Mobility    Modified Rankin (Stroke Patients Only)       Balance                                    Cognition                            Exercises      General Comments        Pertinent Vitals/Pain C/o "soreness"    Home Living                      Prior Function            PT Goals (current goals can now be found in the care plan section)  Progress towards PT goals: Progressing toward goals    Frequency  7X/week    PT Plan      Co-evaluation             End of Session Equipment Utilized During Treatment: Gait belt Activity Tolerance: Treatment limited secondary to medical complications (Comment) (HYPOTENTION) Patient left: in bed;with call bell/phone within reach;with family/visitor present     Time: 1540-1600 PT Time Calculation (min): 20 min   Rica Koyanagi  PTA [facility]  Acute  Rehab Pager      [phone]

## 2013-12-09 NOTE — Progress Notes (Signed)
   Subjective: 3 Days Post-Op Procedure(s) (LRB): LEFT TOTAL KNEE ARTHROPLASTY (Left)   Patient reports pain as mild, pain controlled. No events throughout the night. Concerned about doing stairs with PT. Ready to be discharged home if she does well with PT and pain stays controlled.   Objective:   VITALS:   Filed Vitals:   12/09/13 0443  BP: 112/70  Pulse: 97  Temp: 100.2 F (37.9 C)  Resp: 16    Neurovascular intact Dorsiflexion/Plantar flexion intact Incision: dressing C/D/I No cellulitis present Compartment soft  LABS  Recent Labs  12/07/13 0427 12/08/13 0530 12/09/13 0436  HGB 11.9* 10.9* 9.9*  HCT 36.5 32.6* 29.7*  WBC 10.6* 10.0 7.5  PLT 176 173 167     Recent Labs  12/07/13 0427  NA 141  K 4.0  BUN 9  CREATININE 0.67  GLUCOSE 125*     Assessment/Plan: 3 Days Post-Op Procedure(s) (LRB): LEFT TOTAL KNEE ARTHROPLASTY (Left) Up with therapy Discharge home with home health Follow up in 2 weeks at [facility]. Follow up with Dr. Tonita Cong in 2 weeks.  Contact information:  [facility] [address] [place] Kaitlin Watkins   PAC  12/09/2013, 9:31 AM

## 2013-12-09 NOTE — Progress Notes (Signed)
Pt with low BP. Notified Rosario Adie, PA, & orders received. [employer]

## 2013-12-09 NOTE — Plan of Care (Signed)
Problem: Phase III Progression Outcomes Goal: Anticoagulant follow-up in place Outcome: Completed/Met Date Met:  12/09/13 xarelto Goal: Other Phase III Outcomes/Goals Outcome: Completed/Met Date Met:  12/09/13 Low BP tx with IV bolus

## 2013-12-10 DIAGNOSIS — M79609 Pain in unspecified limb: Secondary | ICD-10-CM

## 2013-12-10 MED ORDER — DIPHENHYDRAMINE HCL 25 MG PO CAPS
25.0000 mg | ORAL_CAPSULE | Freq: Four times a day (QID) | ORAL | Status: DC | PRN
  Administered 2013-12-10: 25 mg via ORAL
  Filled 2013-12-10: qty 1

## 2013-12-10 NOTE — Progress Notes (Signed)
12/10/13 1125  Reviewed discharge instructions with patient and husband.  Both verbalized understanding of discharge instructions.  Copy of discharge instructions and prescriptions given to patient.  There was no evidence of a DVT in the LLE on doppler.

## 2013-12-10 NOTE — Progress Notes (Signed)
VASCULAR LAB PRELIMINARY  PRELIMINARY  PRELIMINARY  PRELIMINARY  Left lower extremity venous duplex completed.    Preliminary report:  Left:  No evidence of DVT, superficial thrombosis, or Baker's cyst.  Nani Ravens, RVT 12/10/2013, 1:30 PM

## 2013-12-10 NOTE — Progress Notes (Signed)
12/10/13 1145  Patient c/o pain behind left knee down to left ankle.  Paged PA.  Verbal Order for duplex doppler of LLE.  Order was placed waiting for doppler to be completed.

## 2013-12-10 NOTE — Discharge Summary (Signed)
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 breakfast.     polyethylene glycol packet  Commonly known as:  MIRALAX / GLYCOLAX  Take 17 g by mouth daily.     rivaroxaban 10 MG Tabs tablet  Commonly known as:  XARELTO  Take 1 tablet (10 mg total) by mouth daily.           Follow-up Information   Follow up with BEANE,JEFFREY C, MD In 2 weeks.   Specialty:  Orthopedic Surgery   Contact information:   [address] [place] [address] [phone]       Follow up with [facility])    Contact information:   [place] [address] [phone]       Follow up with Johnn Hai, MD. Schedule an appointment as soon as possible for a visit in 2 weeks.   Specialty:  Orthopedic Surgery    Contact information:   [address] [place] [address] [phone]       Signed: Lacie Draft, PA-C Orthopaedic Surgery 12/10/2013, 2:19 PM

## 2013-12-10 NOTE — Progress Notes (Signed)
Physical Therapy Treatment Patient Details Name: Kaitlin Watkins MRN: [id number] DOB: 1946/02/28 Today's Date: 12/10/2013    History of Present Illness Pt is a 68 year old female s/p L TKA.    PT Comments    Pt ambulated in hallway and able to tolerate better distance then previously, however reports some mild dizziness but aware of need to return to room and sit down.  Pt rested and performed heel slides EOB then practiced one step.  Pt reports no further questions and states spouse to assist at home.   Follow Up Recommendations  Home health PT;Supervision for mobility/OOB     Equipment Recommendations  None recommended by PT    Recommendations for Other Services       Precautions / Restrictions Precautions Precautions: Knee Precaution Comments: pt up with nsg tech without KI, no buckling reported so did not use today Restrictions Other Position/Activity Restrictions: WBAT    Mobility  Bed Mobility Overal bed mobility: Needs Assistance Bed Mobility: Sit to Supine       Sit to supine: Min assist   General bed mobility comments: assist with L LE  Transfers Overall transfer level: Needs assistance Equipment used: Rolling walker (2 wheeled) Transfers: Sit to/from Stand Sit to Stand: Min guard         General transfer comment: verbal cues for L LE forward, pt with good hand placement without cues  Ambulation/Gait Ambulation/Gait assistance: Min guard Ambulation Distance (Feet): 50 Feet Assistive device: Rolling walker (2 wheeled) Gait Pattern/deviations: Step-to pattern;Antalgic;Trunk flexed;Decreased stance time - left Gait velocity: decreased   General Gait Details: verbal cues for sequence, RW position   Stairs Stairs: Yes Stairs assistance: Min guard Stair Management: Step to pattern;Forwards;Backwards;With walker Number of Stairs: 1 General stair comments: performed one step forwards however pt with difficulty weight shifting so performed backwards  twice with better technique, verbal cues for sequence, safety  Wheelchair Mobility    Modified Rankin (Stroke Patients Only)       Balance                                    Cognition Arousal/Alertness: Awake/alert Behavior During Therapy: WFL for tasks assessed/performed Overall Cognitive Status: Within Functional Limits for tasks assessed                      Exercises Total Joint Exercises Ankle Circles/Pumps: AROM;Both;10 reps Quad Sets: AROM;Both;5 reps Heel Slides: Left;10 reps;AAROM;Seated    General Comments        Pertinent Vitals/Pain Reports pain under control, mostly just L knee soreness, activity to tolerance    Home Living                      Prior Function            PT Goals (current goals can now be found in the care plan section) Progress towards PT goals: Progressing toward goals    Frequency  7X/week    PT Plan Current plan remains appropriate    Co-evaluation             End of Session Equipment Utilized During Treatment: Gait belt Activity Tolerance: Patient tolerated treatment well Patient left: in bed;with call bell/phone within reach;with nursing/sitter in room     Time: 4098-1191 PT Time Calculation (min): 35 min  Charges:  $Gait Training: 23-37 mins  G CodesJunius Argyle 12/10/2013, 10:38 AM Carmelia Bake, PT, DPT 12/10/2013 Pager: [phone]

## 2013-12-10 NOTE — Progress Notes (Signed)
Subjective: 4 Days Post-Op Procedure(s) (LRB): LEFT TOTAL KNEE ARTHROPLASTY (Left) Patient reports pain as mild.  Seen in AM rounds for Dr. Tonita Cong. Pt reports she is doing well this AM, is seated in bed, no dizziness or nausea which had limited her PT this weekend. She feels she will be ready to go home today. She is noting some slight itching but no rash.  Objective: Vital signs in last 24 hours: Temp:  [98.2 F (36.8 C)-98.7 F (37.1 C)] 98.2 F (36.8 C) (06/08 0515) Pulse Rate:  [88-135] 88 (06/08 0515) Resp:  [16-18] 18 (06/08 0515) BP: (76-117)/(50-76) 117/76 mmHg (06/08 0515) SpO2:  [93 %-97 %] 97 % (06/08 0515)  Intake/Output from previous day: 06/07 0701 - 06/08 0700 In: 1400 [P.O.:900; IV Piggyback:500] Out: -  Intake/Output this shift:     Recent Labs  12/08/13 0530 12/09/13 0436  HGB 10.9* 9.9*    Recent Labs  12/08/13 0530 12/09/13 0436  WBC 10.0 7.5  RBC 3.72* 3.35*  HCT 32.6* 29.7*  PLT 173 167   No results found for this basename: NA, K, CL, CO2, BUN, CREATININE, GLUCOSE, CALCIUM,  in the last 72 hours No results found for this basename: LABPT, INR,  in the last 72 hours  Neurologically intact ABD soft Neurovascular intact Sensation intact distally Intact pulses distally Dorsiflexion/Plantar flexion intact Incision: dressing C/D/I and no drainage No cellulitis present Compartment soft no calf pain or sign of DVT  Assessment/Plan: 4 Days Post-Op Procedure(s) (LRB): LEFT TOTAL KNEE ARTHROPLASTY (Left) Advance diet Up with therapy Plan D/C home with HHPT later today Discussed post-op protocols, dressing instructions Will order another aquacel to her room to be sent home to change dressing after 7 days Xarelto x 21 days due to prior hx DVT Benadryl prn itching Will discuss with Dr. Tonita Cong Follow up in approx 10 days for suture removal and xrays  Oluwadamilola Rosamond M. Glendine Swetz PA-C 12/10/2013, 7:42 AM

## 2013-12-12 ENCOUNTER — Telehealth: Payer: Self-pay

## 2013-12-12 NOTE — Telephone Encounter (Signed)
Talked with Kaitlin Watkins and told her that her Bone Density showed Osteopenia and that Dr Dion Saucier wanted her to come in and discuss options. That it could be treated with medication, nothing urgent. She will call us back when she is doing better from her Knee replacement surgery.

## 2013-12-16 DIAGNOSIS — M25569 Pain in unspecified knee: Secondary | ICD-10-CM | POA: Insufficient documentation

## 2013-12-16 DIAGNOSIS — Z01818 Encounter for other preprocedural examination: Secondary | ICD-10-CM | POA: Insufficient documentation

## 2013-12-16 DIAGNOSIS — Z0000 Encounter for general adult medical examination without abnormal findings: Secondary | ICD-10-CM | POA: Insufficient documentation

## 2013-12-18 ENCOUNTER — Ambulatory Visit (INDEPENDENT_AMBULATORY_CARE_PROVIDER_SITE_OTHER): Payer: Medicare Other | Admitting: Family Medicine

## 2013-12-18 ENCOUNTER — Encounter: Payer: Self-pay | Admitting: Family Medicine

## 2013-12-18 VITALS — BP 135/74 | HR 72 | Resp 16 | Wt 192.0 lb

## 2013-12-18 DIAGNOSIS — I10 Essential (primary) hypertension: Secondary | ICD-10-CM

## 2013-12-18 DIAGNOSIS — M949 Disorder of cartilage, unspecified: Secondary | ICD-10-CM | POA: Diagnosis not present

## 2013-12-18 DIAGNOSIS — B009 Herpesviral infection, unspecified: Secondary | ICD-10-CM | POA: Diagnosis not present

## 2013-12-18 DIAGNOSIS — M899 Disorder of bone, unspecified: Secondary | ICD-10-CM

## 2013-12-18 DIAGNOSIS — R609 Edema, unspecified: Secondary | ICD-10-CM | POA: Diagnosis not present

## 2013-12-18 DIAGNOSIS — B001 Herpesviral vesicular dermatitis: Secondary | ICD-10-CM

## 2013-12-18 DIAGNOSIS — M8580 Other specified disorders of bone density and structure, unspecified site: Secondary | ICD-10-CM

## 2013-12-18 MED ORDER — LOSARTAN POTASSIUM 50 MG PO TABS
50.0000 mg | ORAL_TABLET | Freq: Every day | ORAL | Status: DC
Start: 2013-12-18 — End: 2022-12-20

## 2013-12-18 MED ORDER — FAMCICLOVIR 500 MG PO TABS: ORAL_TABLET | ORAL | Status: AC

## 2013-12-18 MED ORDER — HYDROCHLOROTHIAZIDE 25 MG PO TABS: 12.5000 mg | ORAL_TABLET | Freq: Every day | ORAL | Status: DC

## 2013-12-18 MED ORDER — RISEDRONATE SODIUM 150 MG PO TABS: 150.0000 mg | ORAL_TABLET | ORAL | Status: AC
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
## 2013-12-18 NOTE — Patient Instructions (Signed)
1)  Osteopenia - Your bone density shows that you have low bone mass which is called Osteopenia.  This is the stage before Osteoporosis. Your daily intake of calcium should be 1200-1800 mg (Citracal 1200 Slow Release)  and the Vitamin D should be 800 - 2000 IU.  Take the Actonel monthly as we discussed.  We can consider rechecking a bone density in 2-3 years.     Osteoporosis Throughout your life, your body breaks down old bone and replaces it with new bone. As you get older, your body does not replace bone as quickly as it breaks it down. By the age of 40 years, most people begin to gradually lose bone because of the imbalance between bone loss and replacement. Some people lose more bone than others. Bone loss beyond a specified normal degree is considered osteoporosis.  Osteoporosis affects the strength and durability of your bones. The inside of the ends of your bones and your flat bones, like the bones of your pelvis, look like honeycomb, filled with tiny open spaces. As bone loss occurs, your bones become less dense. This means that the open spaces inside your bones become bigger and the walls between these spaces become thinner. This makes your bones weaker. Bones of a person with osteoporosis can become so weak that they can break (fracture) during minor accidents, such as a simple fall. CAUSES  The following factors have been associated with the development of osteoporosis:  Smoking.  Drinking more than 2 alcoholic drinks several days per week.  Long-term use of certain medicines:  Corticosteroids.  Chemotherapy medicines.  Thyroid medicines.  Antiepileptic medicines.  Gonadal hormone suppression medicine.  Immunosuppression medicine.  Being underweight.  Lack of physical activity.  Lack of exposure to the sun. This can lead to vitamin D deficiency.  Certain medical conditions:  Certain inflammatory bowel diseases, such as Crohn disease and ulcerative  colitis.  Diabetes.  Hyperthyroidism.  Hyperparathyroidism. RISK FACTORS Anyone can develop osteoporosis. However, the following factors can increase your risk of developing osteoporosis:  Gender Women are at higher risk than men.  Age Being older than 66 years increases your risk.  Ethnicity White and Asian people have an increased risk.  Weight Being extremely underweight can increase your risk of osteoporosis.  Family history of osteoporosis Having a family member who has developed osteoporosis can increase your risk. SYMPTOMS  Usually, people with osteoporosis have no symptoms.  DIAGNOSIS  Signs during a physical exam that may prompt your caregiver to suspect osteoporosis include:  Decreased height. This is usually caused by the compression of the bones that form your spine (vertebrae) because they have weakened and become fractured.  A curving or rounding of the upper back (kyphosis). To confirm signs of osteoporosis, your caregiver may request a procedure that uses 2 low-dose X-ray beams with different levels of energy to measure your bone mineral density (dual-energy X-ray absorptiometry [DXA]). Also, your caregiver may check your level of vitamin D. TREATMENT  The goal of osteoporosis treatment is to strengthen bones in order to decrease the risk of bone fractures. There are different types of medicines available to help achieve this goal. Some of these medicines work by slowing the processes of bone loss. Some medicines work by increasing bone density. Treatment also involves making sure that your levels of calcium and vitamin D are adequate. PREVENTION  There are things you can do to help prevent osteoporosis. Adequate intake of calcium and vitamin D can help you achieve optimal bone  mineral density. Regular exercise can also help, especially resistance and weight-bearing activities. If you smoke, quitting smoking is an important part of osteoporosis prevention. MAKE SURE  YOU:  Understand these instructions.  Will watch your condition.  Will get help right away if you are not doing well or get worse. FOR MORE INFORMATION www.osteo.org and EquipmentWeekly.com.ee Document Released: 03/31/2005 Document Revised: 10/16/2012 Document Reviewed: 06/05/2011 [facility] Patient Information 2014 [place], [place].  Risedronate tablets What is this medicine? RISEDRONATE (ris ED roe nate) reduces calcium loss from bones. It helps make healthy bone and to slow bone loss in patients with Paget's disease and osteoporosis. It may be used in others at risk for bone loss. This medicine may be used for other purposes; ask your health care Pansie Guggisberg or pharmacist if you have questions. COMMON BRAND NAME(S): Actonel What should I tell my health care Edyn Popoca before I take this medicine? They need to know if you have any of these conditions: -dental disease -esophagus, stomach, or intestine problems, like acid reflux or GERD -kidney disease -low blood calcium -problems sitting or standing for 30 minutes -trouble swallowing -an unusual or allergic reaction to risedronate, other medicines, foods, dyes, or preservatives -pregnant or trying to get pregnant -breast-feeding How should I use this medicine? You must take this medication exactly as directed or you will lower the amount of medicine you absorb into your body or you may cause your self harm. Take this medicine by mouth first thing in the morning, after you are up for the day. Do not eat or drink anything before you take this medicine. Swallow the tablets with a full glass (6 to 8 fluid ounces) of plain water. Do not take the tablets with any other drink. Do not chew or crush the tablet. After taking this medicine, do not eat breakfast, drink, or take any other medicines or vitamins for at least 30 minutes. Stand or sit up for at least 30 minutes after you take this medicine; do not lie down. Do not take your medicine more often than  directed. Talk to your pediatrician regarding the use of this medicine in children. Special care may be needed. Overdosage: If you think you have taken too much of this medicine contact a poison control center or emergency room at once. NOTE: This medicine is only for you. Do not share this medicine with others. What if I miss a dose? If you miss a dose, do not take it later in the day. Take your normal dose the next morning. Do not take double or extra doses. What may interact with this medicine? -antacids like aluminum hydroxide or magnesium hydroxide -aspirin -calcium supplements -iron supplements -NSAIDs, medicines for pain and inflammation, like ibuprofen or naproxen -thyroid hormones -vitamins with minerals This list may not describe all possible interactions. Give your health care Dareion Kneece a list of all the medicines, herbs, non-prescription drugs, or dietary supplements you use. Also tell them if you smoke, drink alcohol, or use illegal drugs. Some items may interact with your medicine. What should I watch for while using this medicine? Visit your doctor or health care professional for regular check ups. It may be some time before you see the benefit from this medicine. Your doctor or health care professional may order blood tests and other tests to see how you are doing. You should make sure you get enough calcium and vitamin D while you are taking this medicine, unless your doctor tells you not to. Discuss the foods you eat and  the vitamins you take with your health care professional. Some people who take this medicine have severe bone, joint, and/or muscle pain. This medicine may also increase your risk for a broken thigh bone. Tell your doctor right away if you have pain in your upper leg or groin. Tell your doctor if you have any pain that does not go away or that gets worse. What side effects may I notice from receiving this medicine? Side effects that you should report to your  doctor as soon as possible: -allergic reactions such as skin rash or itching, hives, swelling of the face, lips, throat, or tongue -black or tarry stools -changes in vision -heartburn or stomach pain -jaw pain, especially after dental work -pain or difficulty when swallowing -redness, blistering, peeling, or loosening of the skin, including inside the mouth Side effects that usually do not require medical attention (report to your doctor if they continue or are bothersome): -bone, muscle, or joint pain -changes in taste -diarrhea or constipation -eye pain or itching -headache -nausea or vomiting -stomach gas or fullness This list may not describe all possible side effects. Call your doctor for medical advice about side effects. You may report side effects to FDA at 1-800-FDA-1088. Where should I keep my medicine? Keep out of the reach of children. Store at room temperature between 20 and 25 degrees C (68 and 77 degrees F). Throw away any unused medicine after the expiration date. NOTE: This sheet is a summary. It may not cover all possible information. If you have questions about this medicine, talk to your doctor, pharmacist, or health care Clancy Leiner.  2014, Elsevier/Gold Standard. (2012-05-12 16:21:37)

## 2013-12-31 ENCOUNTER — Ambulatory Visit: Payer: Medicare Other | Attending: Specialist | Admitting: Physical Therapy

## 2013-12-31 DIAGNOSIS — M25569 Pain in unspecified knee: Secondary | ICD-10-CM | POA: Diagnosis not present

## 2013-12-31 DIAGNOSIS — R5381 Other malaise: Secondary | ICD-10-CM | POA: Insufficient documentation

## 2013-12-31 DIAGNOSIS — IMO0001 Reserved for inherently not codable concepts without codable children: Secondary | ICD-10-CM | POA: Insufficient documentation

## 2013-12-31 DIAGNOSIS — I10 Essential (primary) hypertension: Secondary | ICD-10-CM | POA: Insufficient documentation

## 2013-12-31 DIAGNOSIS — M25669 Stiffness of unspecified knee, not elsewhere classified: Secondary | ICD-10-CM | POA: Diagnosis not present

## 2013-12-31 DIAGNOSIS — Z96659 Presence of unspecified artificial knee joint: Secondary | ICD-10-CM | POA: Diagnosis not present

## 2014-01-03 ENCOUNTER — Ambulatory Visit: Payer: Medicare Other | Attending: Specialist | Admitting: Physical Therapy

## 2014-01-03 DIAGNOSIS — R5381 Other malaise: Secondary | ICD-10-CM | POA: Diagnosis not present

## 2014-01-03 DIAGNOSIS — M25669 Stiffness of unspecified knee, not elsewhere classified: Secondary | ICD-10-CM | POA: Diagnosis not present

## 2014-01-03 DIAGNOSIS — IMO0001 Reserved for inherently not codable concepts without codable children: Secondary | ICD-10-CM | POA: Insufficient documentation

## 2014-01-03 DIAGNOSIS — I10 Essential (primary) hypertension: Secondary | ICD-10-CM | POA: Diagnosis not present

## 2014-01-03 DIAGNOSIS — M25569 Pain in unspecified knee: Secondary | ICD-10-CM | POA: Diagnosis not present

## 2014-01-03 DIAGNOSIS — Z96659 Presence of unspecified artificial knee joint: Secondary | ICD-10-CM | POA: Diagnosis not present

## 2014-01-07 ENCOUNTER — Ambulatory Visit: Payer: Medicare Other | Admitting: Physical Therapy

## 2014-01-07 DIAGNOSIS — IMO0001 Reserved for inherently not codable concepts without codable children: Secondary | ICD-10-CM | POA: Diagnosis not present

## 2014-01-09 ENCOUNTER — Ambulatory Visit: Payer: Medicare Other | Admitting: Physical Therapy

## 2014-01-09 DIAGNOSIS — IMO0001 Reserved for inherently not codable concepts without codable children: Secondary | ICD-10-CM | POA: Diagnosis not present

## 2014-01-11 ENCOUNTER — Ambulatory Visit: Payer: Medicare Other | Admitting: Physical Therapy

## 2014-01-11 DIAGNOSIS — IMO0001 Reserved for inherently not codable concepts without codable children: Secondary | ICD-10-CM | POA: Diagnosis not present

## 2014-01-15 ENCOUNTER — Ambulatory Visit: Payer: Medicare Other | Admitting: Physical Therapy

## 2014-01-15 DIAGNOSIS — IMO0001 Reserved for inherently not codable concepts without codable children: Secondary | ICD-10-CM | POA: Diagnosis not present

## 2014-01-16 ENCOUNTER — Ambulatory Visit: Payer: Medicare Other | Admitting: Physical Therapy

## 2014-01-16 DIAGNOSIS — IMO0001 Reserved for inherently not codable concepts without codable children: Secondary | ICD-10-CM | POA: Diagnosis not present

## 2014-01-18 ENCOUNTER — Ambulatory Visit: Payer: Medicare Other | Admitting: *Deleted

## 2014-01-22 ENCOUNTER — Ambulatory Visit: Payer: Medicare Other | Admitting: Physical Therapy

## 2014-01-22 DIAGNOSIS — IMO0001 Reserved for inherently not codable concepts without codable children: Secondary | ICD-10-CM | POA: Diagnosis not present

## 2014-01-23 ENCOUNTER — Ambulatory Visit: Payer: Medicare Other | Admitting: Physical Therapy

## 2014-01-23 DIAGNOSIS — IMO0001 Reserved for inherently not codable concepts without codable children: Secondary | ICD-10-CM | POA: Diagnosis not present

## 2014-01-24 ENCOUNTER — Ambulatory Visit: Payer: Medicare Other | Admitting: Physical Therapy

## 2014-01-24 DIAGNOSIS — IMO0001 Reserved for inherently not codable concepts without codable children: Secondary | ICD-10-CM | POA: Diagnosis not present

## 2014-01-25 ENCOUNTER — Encounter: Payer: Managed Care, Other (non HMO) | Admitting: Physical Therapy

## 2014-01-28 ENCOUNTER — Ambulatory Visit: Payer: Medicare Other | Admitting: Physical Therapy

## 2014-01-28 DIAGNOSIS — IMO0001 Reserved for inherently not codable concepts without codable children: Secondary | ICD-10-CM | POA: Diagnosis not present

## 2014-01-30 ENCOUNTER — Ambulatory Visit: Payer: Medicare Other | Admitting: Physical Therapy

## 2014-01-30 DIAGNOSIS — IMO0001 Reserved for inherently not codable concepts without codable children: Secondary | ICD-10-CM | POA: Diagnosis not present

## 2014-02-01 ENCOUNTER — Ambulatory Visit: Payer: Medicare Other | Admitting: Physical Therapy

## 2014-02-01 DIAGNOSIS — IMO0001 Reserved for inherently not codable concepts without codable children: Secondary | ICD-10-CM | POA: Diagnosis not present

## 2014-02-04 ENCOUNTER — Ambulatory Visit: Payer: Medicare Other | Attending: Specialist | Admitting: Physical Therapy

## 2014-02-04 DIAGNOSIS — I10 Essential (primary) hypertension: Secondary | ICD-10-CM | POA: Diagnosis not present

## 2014-02-04 DIAGNOSIS — M25569 Pain in unspecified knee: Secondary | ICD-10-CM | POA: Insufficient documentation

## 2014-02-04 DIAGNOSIS — M25669 Stiffness of unspecified knee, not elsewhere classified: Secondary | ICD-10-CM | POA: Diagnosis not present

## 2014-02-04 DIAGNOSIS — Z96659 Presence of unspecified artificial knee joint: Secondary | ICD-10-CM | POA: Diagnosis not present

## 2014-02-04 DIAGNOSIS — IMO0001 Reserved for inherently not codable concepts without codable children: Secondary | ICD-10-CM | POA: Insufficient documentation

## 2014-02-04 DIAGNOSIS — R5381 Other malaise: Secondary | ICD-10-CM | POA: Insufficient documentation

## 2014-02-06 ENCOUNTER — Ambulatory Visit: Payer: Medicare Other | Admitting: Physical Therapy

## 2014-02-06 DIAGNOSIS — IMO0001 Reserved for inherently not codable concepts without codable children: Secondary | ICD-10-CM | POA: Diagnosis not present

## 2014-02-08 ENCOUNTER — Ambulatory Visit: Payer: Medicare Other | Admitting: Physical Therapy

## 2014-02-08 DIAGNOSIS — IMO0001 Reserved for inherently not codable concepts without codable children: Secondary | ICD-10-CM | POA: Diagnosis not present

## 2014-11-25 ENCOUNTER — Other Ambulatory Visit: Payer: Self-pay

## 2014-11-25 DIAGNOSIS — Z1231 Encounter for screening mammogram for malignant neoplasm of breast: Secondary | ICD-10-CM

## 2014-12-06 ENCOUNTER — Ambulatory Visit
Admission: RE | Admit: 2014-12-06 | Discharge: 2014-12-06 | Disposition: A | Discharge status: Home / Self Care | Payer: Medicare Other | Source: Ambulatory Visit

## 2014-12-06 DIAGNOSIS — Z1231 Encounter for screening mammogram for malignant neoplasm of breast: Secondary | ICD-10-CM

## 2014-12-26 ENCOUNTER — Observation Stay (HOSPITAL_COMMUNITY)
Admission: EM | Admit: 2014-12-26 | Discharge: 2014-12-29 | Disposition: A | Discharge status: Home / Self Care | Payer: Medicare Other | Attending: Internal Medicine | Admitting: Internal Medicine

## 2014-12-26 ENCOUNTER — Encounter (HOSPITAL_COMMUNITY): Payer: Self-pay | Admitting: Emergency Medicine

## 2014-12-26 ENCOUNTER — Emergency Department (HOSPITAL_COMMUNITY): Payer: Medicare Other

## 2014-12-26 DIAGNOSIS — R200 Anesthesia of skin: Secondary | ICD-10-CM

## 2014-12-26 DIAGNOSIS — R1013 Epigastric pain: Secondary | ICD-10-CM | POA: Insufficient documentation

## 2014-12-26 DIAGNOSIS — Z87891 Personal history of nicotine dependence: Secondary | ICD-10-CM | POA: Diagnosis not present

## 2014-12-26 DIAGNOSIS — R109 Unspecified abdominal pain: Secondary | ICD-10-CM | POA: Diagnosis present

## 2014-12-26 DIAGNOSIS — I10 Essential (primary) hypertension: Secondary | ICD-10-CM | POA: Diagnosis present

## 2014-12-26 DIAGNOSIS — E785 Hyperlipidemia, unspecified: Secondary | ICD-10-CM | POA: Diagnosis not present

## 2014-12-26 DIAGNOSIS — R55 Syncope and collapse: Secondary | ICD-10-CM | POA: Diagnosis present

## 2014-12-26 DIAGNOSIS — M25569 Pain in unspecified knee: Secondary | ICD-10-CM | POA: Diagnosis present

## 2014-12-26 DIAGNOSIS — R079 Chest pain, unspecified: Secondary | ICD-10-CM | POA: Insufficient documentation

## 2014-12-26 DIAGNOSIS — R42 Dizziness and giddiness: Secondary | ICD-10-CM

## 2014-12-26 DIAGNOSIS — M25559 Pain in unspecified hip: Secondary | ICD-10-CM | POA: Diagnosis not present

## 2014-12-26 DIAGNOSIS — R10A2 Flank pain, left side: Secondary | ICD-10-CM | POA: Diagnosis present

## 2014-12-26 DIAGNOSIS — K219 Gastro-esophageal reflux disease without esophagitis: Secondary | ICD-10-CM | POA: Diagnosis present

## 2014-12-26 DIAGNOSIS — Z7982 Long term (current) use of aspirin: Secondary | ICD-10-CM | POA: Insufficient documentation

## 2014-12-26 DIAGNOSIS — R0602 Shortness of breath: Secondary | ICD-10-CM | POA: Insufficient documentation

## 2014-12-26 DIAGNOSIS — Z86718 Personal history of other venous thrombosis and embolism: Secondary | ICD-10-CM | POA: Diagnosis not present

## 2014-12-26 DIAGNOSIS — W19XXXA Unspecified fall, initial encounter: Secondary | ICD-10-CM | POA: Diagnosis not present

## 2014-12-26 DIAGNOSIS — R1033 Periumbilical pain: Secondary | ICD-10-CM

## 2014-12-26 DIAGNOSIS — M545 Low back pain, unspecified: Secondary | ICD-10-CM

## 2014-12-26 DIAGNOSIS — E669 Obesity, unspecified: Secondary | ICD-10-CM | POA: Diagnosis not present

## 2014-12-26 DIAGNOSIS — E079 Disorder of thyroid, unspecified: Secondary | ICD-10-CM | POA: Diagnosis not present

## 2014-12-26 DIAGNOSIS — R110 Nausea: Secondary | ICD-10-CM

## 2014-12-26 DIAGNOSIS — M94261 Chondromalacia, right knee: Secondary | ICD-10-CM | POA: Diagnosis present

## 2014-12-26 DIAGNOSIS — I959 Hypotension, unspecified: Secondary | ICD-10-CM | POA: Insufficient documentation

## 2014-12-26 LAB — COMPREHENSIVE METABOLIC PANEL
ALBUMIN: 3.6 g/dL (ref 3.5–5.0)
ALT: 28 U/L (ref 14–54)
AST: 29 U/L (ref 15–41)
Alkaline Phosphatase: 81 U/L (ref 38–126)
Anion gap: 10 (ref 5–15)
BILIRUBIN TOTAL: 0.4 mg/dL (ref 0.3–1.2)
BUN: 13 mg/dL (ref 6–20)
CO2: 23 mmol/L (ref 22–32)
Calcium: 8.7 mg/dL — ABNORMAL LOW (ref 8.9–10.3)
Chloride: 101 mmol/L (ref 101–111)
Creatinine, Ser: 1.02 mg/dL — ABNORMAL HIGH (ref 0.44–1.00)
GFR calc Af Amer: >60 mL/min (ref >60)
GFR calc non Af Amer: 54 mL/min — ABNORMAL LOW (ref >60)
Glucose, Bld: 112 mg/dL — ABNORMAL HIGH (ref 65–99)
Potassium: 3.5 mmol/L (ref 3.5–5.1)
SODIUM: 134 mmol/L — AB (ref 135–145)
Total Protein: 6.4 g/dL — ABNORMAL LOW (ref 6.5–8.1)

## 2014-12-26 LAB — CBC WITH DIFFERENTIAL/PLATELET
BASOS ABS: 0 10*3/uL (ref 0.0–0.1)
BASOS PCT: 0 % (ref 0–1)
EOS PCT: 2 % (ref 0–5)
Eosinophils Absolute: 0.1 10*3/uL (ref 0.0–0.7)
HEMATOCRIT: 38.5 % (ref 36.0–46.0)
Hemoglobin: 13 g/dL (ref 12.0–15.0)
LYMPHS PCT: 13 % (ref 12–46)
Lymphs Abs: 1 10*3/uL (ref 0.7–4.0)
MCH: 29.7 pg (ref 26.0–34.0)
MCHC: 33.8 g/dL (ref 30.0–36.0)
MCV: 88.1 fL (ref 78.0–100.0)
MONO ABS: 0.7 10*3/uL (ref 0.1–1.0)
Monocytes Relative: 9 % (ref 3–12)
NEUTROS PCT: 76 % (ref 43–77)
Neutro Abs: 5.8 10*3/uL (ref 1.7–7.7)
Platelets: 190 10*3/uL (ref 150–400)
RBC: 4.37 MIL/uL (ref 3.87–5.11)
RDW: 13.5 % (ref 11.5–15.5)
WBC: 7.6 10*3/uL (ref 4.0–10.5)

## 2014-12-26 LAB — I-STAT TROPONIN, ED: Troponin i, poc: 0 ng/mL (ref 0.00–0.08)

## 2014-12-26 LAB — CBG MONITORING, ED: Glucose-Capillary: 107 mg/dL — ABNORMAL HIGH (ref 65–99)

## 2014-12-26 LAB — I-STAT CG4 LACTIC ACID, ED: Lactic Acid, Venous: 1.08 mmol/L (ref 0.5–2.0)

## 2014-12-26 LAB — LIPASE, BLOOD: LIPASE: 26 U/L (ref 22–51)

## 2014-12-26 MED ORDER — HYDROCODONE-ACETAMINOPHEN 5-325 MG PO TABS
1.0000 | ORAL_TABLET | Freq: Once | ORAL | Status: AC
  Administered 2014-12-26: 1 via ORAL
  Filled 2014-12-26: qty 1

## 2014-12-26 MED ORDER — ONDANSETRON HCL 4 MG/2ML IJ SOLN
4.0000 mg | Freq: Once | INTRAMUSCULAR | Status: AC
  Administered 2014-12-26: 4 mg via INTRAVENOUS
  Filled 2014-12-26: qty 2

## 2014-12-26 MED ORDER — SODIUM CHLORIDE 0.9 % IV BOLUS (SEPSIS)
1000.0000 mL | Freq: Once | INTRAVENOUS | Status: AC
Start: 2014-12-26 — End: 2014-12-27
  Administered 2014-12-26: 1000 mL via INTRAVENOUS

## 2014-12-26 NOTE — ED Notes (Signed)
Pt in [facility]. Pt having dinner, got sweaty, dizzy, passed out. Pt hit butt, not sure if hit head. Pt does not recall falling or passing out. Syncopal episode witnessed. EMS arrived, pt refused to come then passed out again. Pt currently nauseous and abd pain, back pain, tail bone pain. Pt recently started taking Contrave (diet pill) to dec BP.
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
## 2014-12-26 NOTE — ED Notes (Signed)
EDP at bedside  

## 2014-12-27 ENCOUNTER — Inpatient Hospital Stay (HOSPITAL_COMMUNITY): Payer: Medicare Other

## 2014-12-27 ENCOUNTER — Observation Stay (HOSPITAL_COMMUNITY): Payer: Medicare Other

## 2014-12-27 ENCOUNTER — Encounter (HOSPITAL_COMMUNITY): Payer: Self-pay

## 2014-12-27 ENCOUNTER — Emergency Department (HOSPITAL_COMMUNITY): Payer: Medicare Other

## 2014-12-27 ENCOUNTER — Other Ambulatory Visit (HOSPITAL_COMMUNITY): Payer: Medicare Other

## 2014-12-27 DIAGNOSIS — R55 Syncope and collapse: Secondary | ICD-10-CM | POA: Diagnosis not present

## 2014-12-27 DIAGNOSIS — R1013 Epigastric pain: Secondary | ICD-10-CM | POA: Diagnosis not present

## 2014-12-27 DIAGNOSIS — R079 Chest pain, unspecified: Secondary | ICD-10-CM

## 2014-12-27 DIAGNOSIS — I10 Essential (primary) hypertension: Secondary | ICD-10-CM | POA: Diagnosis not present

## 2014-12-27 DIAGNOSIS — R200 Anesthesia of skin: Secondary | ICD-10-CM

## 2014-12-27 DIAGNOSIS — I959 Hypotension, unspecified: Secondary | ICD-10-CM | POA: Diagnosis not present

## 2014-12-27 DIAGNOSIS — R109 Unspecified abdominal pain: Secondary | ICD-10-CM | POA: Diagnosis present

## 2014-12-27 DIAGNOSIS — R208 Other disturbances of skin sensation: Secondary | ICD-10-CM

## 2014-12-27 LAB — URINALYSIS, ROUTINE W REFLEX MICROSCOPIC
BILIRUBIN URINE: NEGATIVE
Glucose, UA: NEGATIVE mg/dL
Hgb urine dipstick: NEGATIVE
Ketones, ur: NEGATIVE mg/dL
Leukocytes, UA: NEGATIVE
NITRITE: NEGATIVE
PROTEIN: NEGATIVE mg/dL
SPECIFIC GRAVITY, URINE: 1.008 (ref 1.005–1.030)
UROBILINOGEN UA: 0.2 mg/dL (ref 0.0–1.0)
pH: 6 (ref 5.0–8.0)

## 2014-12-27 LAB — LIPID PANEL
CHOL/HDL RATIO: 3.4 ratio
Cholesterol: 147 mg/dL (ref 0–200)
HDL: 43 mg/dL (ref >40)
LDL Cholesterol: 86 mg/dL (ref 0–99)
TRIGLYCERIDES: 90 mg/dL (ref <150)
VLDL: 18 mg/dL (ref 0–40)

## 2014-12-27 LAB — APTT: aPTT: 30 seconds (ref 24–37)

## 2014-12-27 LAB — PROTIME-INR
INR: 1.14 (ref 0.00–1.49)
PROTHROMBIN TIME: 14.8 s (ref 11.6–15.2)

## 2014-12-27 LAB — D-DIMER, QUANTITATIVE (NOT AT ARMC): D-Dimer, Quant: 1.44 ug/mL-FEU — ABNORMAL HIGH (ref 0.00–0.48)

## 2014-12-27 MED ORDER — MORPHINE SULFATE 2 MG/ML IJ SOLN: 2.0000 mg | INTRAMUSCULAR | Status: DC | PRN

## 2014-12-27 MED ORDER — HYDROCODONE-ACETAMINOPHEN 10-325 MG PO TABS
1.0000 | ORAL_TABLET | Freq: Four times a day (QID) | ORAL | Status: DC | PRN
  Administered 2014-12-28: 1 via ORAL
  Administered 2014-12-29: 2 via ORAL
  Filled 2014-12-27 (×3): qty 1

## 2014-12-27 MED ORDER — CALCIUM CARBONATE 1250 (500 CA) MG PO TABS
1250.0000 mg | ORAL_TABLET | Freq: Two times a day (BID) | ORAL | Status: DC
  Administered 2014-12-27 – 2014-12-28 (×3): 1250 mg via ORAL
  Filled 2014-12-27 (×7): qty 1

## 2014-12-27 MED ORDER — ONDANSETRON HCL 4 MG/2ML IJ SOLN
4.0000 mg | Freq: Three times a day (TID) | INTRAMUSCULAR | Status: DC | PRN
  Administered 2014-12-28 – 2014-12-29 (×2): 4 mg via INTRAVENOUS
  Filled 2014-12-27 (×2): qty 2

## 2014-12-27 MED ORDER — MELATONIN 3 MG PO TABS
3.0000 mg | ORAL_TABLET | Freq: Every evening | ORAL | Status: DC | PRN
  Filled 2014-12-27: qty 1

## 2014-12-27 MED ORDER — IOHEXOL 300 MG/ML  SOLN
25.0000 mL | Freq: Once | INTRAMUSCULAR | Status: AC | PRN
  Administered 2014-12-27: 25 mL via ORAL

## 2014-12-27 MED ORDER — OMEGA-3-ACID ETHYL ESTERS 1 G PO CAPS
1.0000 g | ORAL_CAPSULE | Freq: Every day | ORAL | Status: DC
  Administered 2014-12-27: 1 g via ORAL
  Filled 2014-12-27 (×3): qty 1

## 2014-12-27 MED ORDER — TECHNETIUM TO 99M ALBUMIN AGGREGATED
6.0000 | Freq: Once | INTRAVENOUS | Status: AC | PRN
  Administered 2014-12-27: 6 via INTRAVENOUS

## 2014-12-27 MED ORDER — ACETAMINOPHEN 325 MG PO TABS
650.0000 mg | ORAL_TABLET | Freq: Four times a day (QID) | ORAL | Status: DC | PRN
  Administered 2014-12-27: 650 mg via ORAL
  Filled 2014-12-27: qty 2

## 2014-12-27 MED ORDER — CO Q-10 200 MG PO CAPS: 200.0000 mg | ORAL_CAPSULE | Freq: Every day | ORAL | Status: DC

## 2014-12-27 MED ORDER — VITAMIN E 45 MG (100 UNIT) PO CAPS
1000.0000 [IU] | ORAL_CAPSULE | Freq: Every day | ORAL | Status: DC
  Administered 2014-12-27: 1000 [IU] via ORAL
  Filled 2014-12-27 (×3): qty 2

## 2014-12-27 MED ORDER — SODIUM CHLORIDE 0.9 % IV BOLUS (SEPSIS)
1000.0000 mL | Freq: Once | INTRAVENOUS | Status: AC
  Administered 2014-12-27: 1000 mL via INTRAVENOUS

## 2014-12-27 MED ORDER — GLUCOSAMINE-CHONDROITIN 500-400 MG PO TABS: 1.0000 | ORAL_TABLET | Freq: Two times a day (BID) | ORAL | Status: DC

## 2014-12-27 MED ORDER — IOHEXOL 300 MG/ML  SOLN
80.0000 mL | Freq: Once | INTRAMUSCULAR | Status: AC | PRN
  Administered 2014-12-27: 80 mL via INTRAVENOUS

## 2014-12-27 MED ORDER — ASPIRIN EC 81 MG PO TBEC
81.0000 mg | DELAYED_RELEASE_TABLET | Freq: Every day | ORAL | Status: DC
  Administered 2014-12-27 – 2014-12-28 (×2): 81 mg via ORAL
  Filled 2014-12-27 (×3): qty 1

## 2014-12-27 MED ORDER — HEPARIN SODIUM (PORCINE) 5000 UNIT/ML IJ SOLN
5000.0000 [IU] | Freq: Three times a day (TID) | INTRAMUSCULAR | Status: DC
  Administered 2014-12-27 – 2014-12-29 (×6): 5000 [IU] via SUBCUTANEOUS
  Filled 2014-12-27 (×10): qty 1

## 2014-12-27 MED ORDER — HYDRALAZINE HCL 20 MG/ML IJ SOLN: 5.0000 mg | INTRAMUSCULAR | Status: DC | PRN

## 2014-12-27 MED ORDER — B COMPLEX-C PO TABS
1.0000 | ORAL_TABLET | Freq: Four times a day (QID) | ORAL | Status: DC
  Administered 2014-12-27 – 2014-12-28 (×4): 1 via ORAL
  Filled 2014-12-27 (×14): qty 1

## 2014-12-27 MED ORDER — SENNOSIDES-DOCUSATE SODIUM 8.6-50 MG PO TABS
1.0000 | ORAL_TABLET | Freq: Every evening | ORAL | Status: DC | PRN
  Administered 2014-12-27: 1 via ORAL
  Filled 2014-12-27 (×2): qty 1

## 2014-12-27 MED ORDER — VITAMIN D3 25 MCG (1000 UNIT) PO TABS
1000.0000 [IU] | ORAL_TABLET | Freq: Two times a day (BID) | ORAL | Status: DC
  Administered 2014-12-27 – 2014-12-28 (×3): 1000 [IU] via ORAL
  Filled 2014-12-27 (×6): qty 1

## 2014-12-27 MED ORDER — PANTOPRAZOLE SODIUM 40 MG PO TBEC
40.0000 mg | DELAYED_RELEASE_TABLET | Freq: Every day | ORAL | Status: DC
  Administered 2014-12-28 – 2014-12-29 (×2): 40 mg via ORAL
  Filled 2014-12-27 (×2): qty 1

## 2014-12-27 MED ORDER — VITAMIN C 500 MG PO TABS
1000.0000 mg | ORAL_TABLET | Freq: Two times a day (BID) | ORAL | Status: DC
  Administered 2014-12-27 – 2014-12-28 (×3): 1000 mg via ORAL
  Filled 2014-12-27 (×6): qty 2

## 2014-12-27 MED ORDER — RISAQUAD PO CAPS
1.0000 | ORAL_CAPSULE | Freq: Two times a day (BID) | ORAL | Status: DC
  Administered 2014-12-27 – 2014-12-28 (×3): 1 via ORAL
  Filled 2014-12-27 (×6): qty 1

## 2014-12-27 MED ORDER — TECHNETIUM TC 99M DIETHYLENETRIAME-PENTAACETIC ACID
40.0000 | Freq: Once | INTRAVENOUS | Status: AC | PRN
Start: 2014-12-27 — End: 2014-12-27

## 2014-12-27 MED ORDER — STROKE: EARLY STAGES OF RECOVERY BOOK
Freq: Once | Status: AC
  Administered 2014-12-27: 1
  Filled 2014-12-27: qty 1

## 2014-12-27 MED ORDER — ATORVASTATIN CALCIUM 80 MG PO TABS
80.0000 mg | ORAL_TABLET | Freq: Every evening | ORAL | Status: DC
  Administered 2014-12-27 – 2014-12-28 (×2): 80 mg via ORAL
  Filled 2014-12-27 (×4): qty 1

## 2014-12-27 MED ORDER — IOHEXOL 350 MG/ML SOLN
100.0000 mL | Freq: Once | INTRAVENOUS | Status: AC | PRN
  Administered 2014-12-27: 100 mL via INTRAVENOUS

## 2014-12-27 NOTE — Progress Notes (Signed)
This admission has been reviewed and determined not to meet inpatient level of care. Both attending Physician and Medical Director are in agreement this should be an Observation encounter according to the Medicare Conditions of Participation as set forth in CFR 42 Chapter 456 482.12 (c) and the Medicare Condition Code-44 Regulations CFR 42 Chapter 100 - 04 50.3. The Patient and/or Patient Representative was notified via delivery of the "MEDICARE OBSERVATION STATUS NOTIFICATION".              
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
## 2014-12-27 NOTE — Progress Notes (Addendum)
[facility] PROGRESS NOTE  Kaitlin Watkins ZOX:[id number] DOB: January 02, 1946 DOA: 12/26/2014 PCP: Ival Bible, MD  Assessment/Plan: Principal Problem:   Syncope and collapse Active Problems:   Chest pain and DOE     Essential hypertension, benign   Left flank pain   Numbness of arm   Abdominal pain     Syncope: Presented with two episodes of syncope. Her BP was 102/71 on admission and got as low as 85/56. She is on two blood pressure medications: losartan and HCTZ. She states she was placed on BP medications because her BP was in the 140s/80s range. We have discontinued her BP medications and will continue to monitor. She did not have orthostatic hypotension when checked on 12/26/14. Head CT and MR brain results listed below. Continue telemetry, IVF, PT/OT.  Chest pain and DOE: elevated d-dimer. Ordered CT angiography.   Abdominal pain: CT abdomen is negative for acute abnormalities. Lipase 26. Continue emperic protonix for GERD.  Essential hypertension, benign: blood pressures have been low and could be the cause of syncope. Discontinue losartan and HCTZ and monitor.   Left Flank pain: DG sacrum/coccyx showed no significant abnormality. Norco prn for pain.  HLD: continue lipitor  Code Status: Full code Family Communication: Husband at bedside. Patient is understanding of plan of care.  Disposition Plan: Remain inpatient   Consultants:  None  Procedures:  None  Antibiotics:  None  HPI/Subjective: Kaitlin Watkins is a 69 y.o. female with PMH of hypertension treated with losartan and HCTZ, hyperlipidemia, GERD, DVT, arthritis, who presented with syncope, left arm numbness, AP and left flank pain. Her first episode of syncope was after dinner outside of a restaurant, preceded by an acute onset of nausea and severe abdominal pain. Her husband states she was unconscious for less than five minutes. She fell on concrete and landed on her left flank. EMS came and she  experienced her second episode of syncope. She denies ever experiencing this before. She has been experiencing DOE with minimal tasks and has had chest pain since admission that radiates to her left back. Her abdominal pain is improving. Her blood pressures have ranged from 85/56 to 124/84. She states she has had black tarry stools that were evaluated with an upper endoscopy 04/05/13 showing mild gastritis that was otherwise normal.   CT abdomen/pelvis negative for acute abnormalities. X-ray of her lumbar and sacral are negative for acute abnormalities. D-dimer elevated at 1.44. MRI showed possible microadenoma.     Objective: Filed Vitals:   12/27/14 0355  BP: 128/73  Pulse: 65  Temp: 98.2 F (36.8 C)  Resp: 18   No intake or output data in the 24 hours ending 12/27/14 1208 Filed Weights   12/26/14 2130 12/27/14 0355  Weight: 86.183 kg (190 lb) 92 kg (202 lb 13.2 oz)    Exam:   General:  Alert and oriented, WDWN, in no acute destress  Cardiovascular: S1/S2, RRR, No murmurs, No gallops or rubs.  Respiratory: CTAB, normal respiratory effort  Abdomen: soft, NT, ND, +BS  Musculoskeletal: no limitation of ROM, no LEE, no visualized deformities. Scar on left knee from total knee replacement in June 2015.  Data Reviewed: Basic Metabolic Panel:  Recent Labs Lab 12/26/14 2200  NA 134*  K 3.5  CL 101  CO2 23  GLUCOSE 112*  BUN 13  CREATININE 1.02*  CALCIUM 8.7*   Liver Function Tests:  Recent Labs Lab 12/26/14 2200  AST 29  ALT 28  ALKPHOS 81  BILITOT  0.4  PROT 6.4*  ALBUMIN 3.6    Recent Labs Lab 12/26/14 2200  LIPASE 26   CBC:  Recent Labs Lab 12/26/14 2200  WBC 7.6  NEUTROABS 5.8  HGB 13.0  HCT 38.5  MCV 88.1  PLT 190   CBG:  Recent Labs Lab 12/26/14 2327  GLUCAP 107*    No results found for this or any previous visit (from the past 240 hour(s)).   Studies: Dg Chest 2 View  12/27/2014   CLINICAL DATA:  Chest pain.  Syncope.  EXAM:  CHEST  2 VIEW  COMPARISON:  04/17/2013.  FINDINGS: Mediastinum and hilar structures are normal. Low lung volumes with mild bibasilar subtotal atelectasis. Heart size normal. No pleural effusion pneumothorax.  IMPRESSION: Low lung volumes with mild bibasilar subsegmental atelectasis.   Electronically Signed   By: Marcello Moores  Register   On: 12/27/2014 07:23   Dg Lumbar Spine Complete  12/26/2014   CLINICAL DATA:  Acute onset of syncope. 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 Centralized lower back pain. Initial encounter.  EXAM: LUMBAR SPINE - COMPLETE 4+ VIEW  COMPARISON:  CT of the abdomen and pelvis performed 04/09/2013  FINDINGS: There is no evidence of fracture or subluxation. Vertebral bodies demonstrate normal height and alignment. Intervertebral disc spaces are preserved. The visualized neural foramina are grossly unremarkable in appearance, though difficult to fully assess at L5-S1.  The visualized bowel gas pattern is unremarkable in appearance; air and stool are noted within the colon. The sacroiliac joints are within normal limits.  IMPRESSION: No evidence of fracture or subluxation along the lumbar spine.   Electronically Signed   By: Garald Balding M.D.   On: 12/26/2014 22:43   Dg Sacrum/coccyx  12/26/2014   CLINICAL DATA:  Acute coccygeal pain after fall onto patio tonight. Initial encounter.  EXAM: SACRUM AND COCCYX - 2+ VIEW  COMPARISON:  None.  FINDINGS: There is no evidence of fracture or other focal bone lesions. Sacroiliac joints appear normal.  IMPRESSION: No significant abnormality seen in the sacrum or coccyx.   Electronically Signed   By: Marijo Conception, M.D.   On: 12/26/2014 22:42   Ct Head Wo Contrast  12/27/2014   CLINICAL DATA:  Syncope and fall. Concern for TIA. Initial encounter.  EXAM: CT HEAD WITHOUT CONTRAST  TECHNIQUE: Contiguous axial images were obtained from the base of the skull through the vertex without intravenous contrast.  COMPARISON:  Maxillofacial CT performed 05/16/2012   FINDINGS: There is no evidence of acute infarction, mass lesion, or intra- or extra-axial hemorrhage on CT.  The posterior fossa, including the cerebellum, brainstem and fourth ventricle, is within normal limits. The third and lateral ventricles, and basal ganglia are unremarkable in appearance. The cerebral hemispheres are symmetric in appearance, with normal gray-white differentiation. No mass effect or midline shift is seen.  There is no evidence of fracture; visualized osseous structures are unremarkable in appearance. The orbits are within normal limits. The paranasal sinuses and mastoid air cells are well-aerated. No significant soft tissue abnormalities are seen.  IMPRESSION: No evidence of traumatic intracranial injury or fracture.   Electronically Signed   By: Garald Balding M.D.   On: 12/27/2014 04:44   Mr Brain Wo Contrast  12/27/2014   CLINICAL DATA:  Chest pain.  Left arm numbness.  EXAM: MRI HEAD WITHOUT CONTRAST  TECHNIQUE: Multiplanar, multiecho pulse sequences of the brain and surrounding structures were obtained without intravenous contrast.  COMPARISON:  Head CT from earlier the same day  FINDINGS:  Calvarium and upper cervical spine: No focal marrow signal abnormality.  Orbits: No significant findings.  Sinuses and Mastoids: Clear. Mastoid and middle ears are clear.  Brain: No acute abnormality such as acute infarct, hemorrhage, hydrocephalus, or mass lesion. No evidence of large vessel occlusion. Cerebral volume and white matter is within normal limits.  Enlarged pituitary gland for age, measuring 8 mm and upwardly convex. A tiny T2 hyperintense, presumably cystic focus present in the right posterior gland on axial imaging. There is no contact of the chiasm.  IMPRESSION: 1. No acute finding.  No explanation for extremity numbness. 2. Mild enlargement of the pituitary gland with cystic change. Microadenoma could have this appearance. Recommend endocrine correlation and imaging follow-up.    Electronically Signed   By: Monte Fantasia M.D.   On: 12/27/2014 08:22   Ct Abdomen Pelvis W Contrast  12/27/2014   CLINICAL DATA:  Patient with syncopal episode. Status post fall, landing on the sacral area. Epigastric and periumbilical pain.  EXAM: CT ABDOMEN AND PELVIS WITH CONTRAST  TECHNIQUE: Multidetector CT imaging of the abdomen and pelvis was performed using the standard protocol following bolus administration of intravenous contrast.  CONTRAST:  51mL OMNIPAQUE IOHEXOL 300 MG/ML SOLN, 14mL OMNIPAQUE IOHEXOL 300 MG/ML SOLN  COMPARISON:  CT 04/09/2013  FINDINGS: Lower chest: Dependent atelectasis within the bilateral lower lobes. Normal heart size.  Hepatobiliary: Liver is normal in size and contour without focal hepatic lesion identified.  Pancreas: Unremarkable  Spleen: Unremarkable  Adrenals/Urinary Tract: Adrenal glands are unremarkable. Kidneys enhance symmetrically with contrast. No hydronephrosis. Urinary bladder is unremarkable.  Stomach/Bowel: Sigmoid colonic diverticulosis. No CT evidence for acute diverticulitis. No abnormal bowel wall thickening or evidence for bowel obstruction.  Vascular/Lymphatic: Normal caliber abdominal aorta. Peripheral calcified atherosclerotic plaque. No retroperitoneal lymphadenopathy.  Other: Status post hysterectomy.  Musculoskeletal: Lower lumbar spine degenerative changes. No aggressive or acute appearing osseous lesions.  IMPRESSION: No acute process within the abdomen or pelvis.  Sigmoid colonic diverticulosis without evidence for acute diverticulitis.   Electronically Signed   By: Lovey Newcomer M.D.   On: 12/27/2014 02:14    Scheduled Meds: .  stroke: mapping our early stages of recovery book   Does not apply Once  . acidophilus  1 capsule Oral BID  . aspirin EC  81 mg Oral Daily  . atorvastatin  80 mg Oral QPM  . B-complex with vitamin C  1 tablet Oral QID  . calcium carbonate  1,250 mg Oral BID WC  . cholecalciferol  1,000 Units Oral BID  . heparin   5,000 Units Subcutaneous 3 times per day  . omega-3 acid ethyl esters  1 g Oral Daily  . pantoprazole  40 mg Oral Q1200  . vitamin C  1,000 mg Oral BID  . vitamin E  1,000 Units Oral Daily   Continuous Infusions:   Principal Problem:   Syncope Active Problems:   GERD (gastroesophageal reflux disease)   Essential hypertension, benign   Obesity   Chondromalacia of right knee   Knee pain   Left flank pain   Numbness of arm   Abdominal pain   Epigastric abdominal pain   Syncope and collapse    Time spent: 35 min    Eula Flax PA-S [facility] Pager [phone]. If 7PM-7AM, please contact night-coverage at [email or web address], password [facility] 12/27/2014, 12:08 PM  LOS: 0 days    Addendum  I personally evaluated patient on 12/27/2014 and agree with the above findings. Patient is a pleasant 69 year old  female with a history of hypertension, gastroesophageal reflux disease, history of DVT, admitted to medicine service on 12/26/2014 when she presented with complaints of syncope. She reported having 2 syncopal spells the evening of admission at a restaurant. She reported having nausea associate with abdominal pain prior to passing out. Patient also complains of dizziness and exertion. She had several low blood pressures overnight as I suspect that hypotension may have caused syncope. Antihypotensive agents have been discontinued. With regard to shortness of breath she was further worked up with a CT scan of lungs which did not show evidence of acute pulmonary embolism. There was a small wed in the right lower lobe artery suspicious for chronic pulmonary embolism. CT also mention cardiomegaly. Will further workup with a transthoracic echocardiogram. On exam lungs were clear to auscultation bilaterally. She did not appear to have clinical evidence of volume overload. I spoke with Dr Haroldine Laws of cardiology who recommended checking 2D echo along with V/Q scan. If these studies come back normal  may proceed with noninvasive study.

## 2014-12-27 NOTE — ED Provider Notes (Signed)
2:01 AM Patient signed out to me by Thereasa Parkin, PA-C. Patient pending CT abdomen pelvis.  3:03 AM Patient's imaging shows no acute changes. Patient will be admitted to Dr. Blaine Hamper for further evaluation of syncopal episodes.   Results for orders placed or performed during the hospital encounter of 12/26/14  Lipase, blood  Result Value Ref Range   Lipase 26 22 - 51 U/L  CBC WITH DIFFERENTIAL  Result Value Ref Range   WBC 7.6 4.0 - 10.5 K/uL   RBC 4.37 3.87 - 5.11 MIL/uL   Hemoglobin 13.0 12.0 - 15.0 g/dL   HCT 38.5 36.0 - 46.0 %   MCV 88.1 78.0 - 100.0 fL   MCH 29.7 26.0 - 34.0 pg   MCHC 33.8 30.0 - 36.0 g/dL   RDW 13.5 11.5 - 15.5 %   Platelets 190 150 - 400 K/uL   Neutrophils Relative % 76 43 - 77 %   Neutro Abs 5.8 1.7 - 7.7 K/uL   Lymphocytes Relative 13 12 - 46 %   Lymphs Abs 1.0 0.7 - 4.0 K/uL   Monocytes Relative 9 3 - 12 %   Monocytes Absolute 0.7 0.1 - 1.0 K/uL   Eosinophils Relative 2 0 - 5 %   Eosinophils Absolute 0.1 0.0 - 0.7 K/uL   Basophils Relative 0 0 - 1 %   Basophils Absolute 0.0 0.0 - 0.1 K/uL  Comprehensive metabolic panel  Result Value Ref Range   Sodium 134 (L) 135 - 145 mmol/L   Potassium 3.5 3.5 - 5.1 mmol/L   Chloride 101 101 - 111 mmol/L   CO2 23 22 - 32 mmol/L   Glucose, Bld 112 (H) 65 - 99 mg/dL   BUN 13 6 - 20 mg/dL   Creatinine, Ser 1.02 (H) 0.44 - 1.00 mg/dL   Calcium 8.7 (L) 8.9 - 10.3 mg/dL   Total Protein 6.4 (L) 6.5 - 8.1 g/dL   Albumin 3.6 3.5 - 5.0 g/dL   AST 29 15 - 41 U/L   ALT 28 14 - 54 U/L   Alkaline Phosphatase 81 38 - 126 U/L   Total Bilirubin 0.4 0.3 - 1.2 mg/dL   GFR calc non Af Amer 54 (L) >60 mL/min   GFR calc Af Amer >60 >60 mL/min   Anion gap 10 5 - 15  Urinalysis, Routine w reflex microscopic (not at [facility])  Result Value Ref Range   Color, Urine YELLOW YELLOW   APPearance CLEAR CLEAR   Specific Gravity, Urine 1.008 1.005 - 1.030   pH 6.0 5.0 - 8.0   Glucose, UA NEGATIVE NEGATIVE mg/dL   Hgb urine  dipstick NEGATIVE NEGATIVE   Bilirubin Urine NEGATIVE NEGATIVE   Ketones, ur NEGATIVE NEGATIVE mg/dL   Protein, ur NEGATIVE NEGATIVE mg/dL   Urobilinogen, UA 0.2 0.0 - 1.0 mg/dL   Nitrite NEGATIVE NEGATIVE   Leukocytes, UA NEGATIVE NEGATIVE  I-Stat CG4 Lactic Acid, ED  Result Value Ref Range   Lactic Acid, Venous 1.08 0.5 - 2.0 mmol/L  I-stat troponin, ED  (not at [facility], [facility])  Result Value Ref Range   Troponin i, poc 0.00 0.00 - 0.08 ng/mL   Comment 3          CBG monitoring, ED  Result Value Ref Range   Glucose-Capillary 107 (H) 65 - 99 mg/dL   Dg Lumbar Spine Complete  12/26/2014   CLINICAL DATA:  Acute onset of syncope. 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 Centralized lower back pain. Initial encounter.  EXAM: LUMBAR  SPINE - COMPLETE 4+ VIEW  COMPARISON:  CT of the abdomen and pelvis performed 04/09/2013  FINDINGS: There is no evidence of fracture or subluxation. Vertebral bodies demonstrate normal height and alignment. Intervertebral disc spaces are preserved. The visualized neural foramina are grossly unremarkable in appearance, though difficult to fully assess at L5-S1.  The visualized bowel gas pattern is unremarkable in appearance; air and stool are noted within the colon. The sacroiliac joints are within normal limits.  IMPRESSION: No evidence of fracture or subluxation along the lumbar spine.   Electronically Signed   By: Garald Balding M.D.   On: 12/26/2014 22:43   Dg Sacrum/coccyx  12/26/2014   CLINICAL DATA:  Acute coccygeal pain after fall onto patio tonight. Initial encounter.  EXAM: SACRUM AND COCCYX - 2+ VIEW  COMPARISON:  None.  FINDINGS: There is no evidence of fracture or other focal bone lesions. Sacroiliac joints appear normal.  IMPRESSION: No significant abnormality seen in the sacrum or coccyx.   Electronically Signed   By: Marijo Conception, M.D.   On: 12/26/2014 22:42   Ct Abdomen Pelvis W Contrast  12/27/2014   CLINICAL DATA:  Patient with syncopal episode. Status post fall,  landing on the sacral area. Epigastric and periumbilical pain.  EXAM: CT ABDOMEN AND PELVIS WITH CONTRAST  TECHNIQUE: Multidetector CT imaging of the abdomen and pelvis was performed using the standard protocol following bolus administration of intravenous contrast.  CONTRAST:  69mL OMNIPAQUE IOHEXOL 300 MG/ML SOLN, 40mL OMNIPAQUE IOHEXOL 300 MG/ML SOLN  COMPARISON:  CT 04/09/2013  FINDINGS: Lower chest: Dependent atelectasis within the bilateral lower lobes. Normal heart size.  Hepatobiliary: Liver is normal in size and contour without focal hepatic lesion identified.  Pancreas: Unremarkable  Spleen: Unremarkable  Adrenals/Urinary Tract: Adrenal glands are unremarkable. Kidneys enhance symmetrically with contrast. No hydronephrosis. Urinary bladder is unremarkable.  Stomach/Bowel: Sigmoid colonic diverticulosis. No CT evidence for acute diverticulitis. No abnormal bowel wall thickening or evidence for bowel obstruction.  Vascular/Lymphatic: Normal caliber abdominal aorta. Peripheral calcified atherosclerotic plaque. No retroperitoneal lymphadenopathy.  Other: Status post hysterectomy.  Musculoskeletal: Lower lumbar spine degenerative changes. No aggressive or acute appearing osseous lesions.  IMPRESSION: No acute process within the abdomen or pelvis.  Sigmoid colonic diverticulosis without evidence for acute diverticulitis.   Electronically Signed   By: Lovey Newcomer M.D.   On: 12/27/2014 02:14   Mm Screening Breast Tomo Bilateral  12/09/2014   CLINICAL DATA:  Screening.  EXAM: DIGITAL SCREENING BILATERAL MAMMOGRAM WITH 3D TOMO WITH CAD  COMPARISON:  Previous exam(s).  ACR Breast Density Category c: The breast tissue is heterogeneously dense, which may obscure small masses.  FINDINGS: There are no findings suspicious for malignancy. Images were processed with CAD.  IMPRESSION: No mammographic evidence of malignancy. A result letter of this screening mammogram will be mailed directly to the patient.  RECOMMENDATION:  Screening mammogram in one year. (Code:SM-B-01Y)  BI-RADS CATEGORY  1: Negative.   Electronically Signed   By: Evangeline Dakin M.D.   On: 12/09/2014 17:49      Alvina Chou, PA-C 12/27/14 0303  Linton Flemings, MD 12/27/14 [phone]

## 2014-12-27 NOTE — ED Notes (Signed)
Admitting at bedside 

## 2014-12-27 NOTE — Progress Notes (Signed)
VASCULAR LAB PRELIMINARY  PRELIMINARY  PRELIMINARY  PRELIMINARY  Carotid duplex completed.    Preliminary report:  Bilateral:  1-39% ICA stenosis.  Vertebral artery flow is antegrade.    Conroy Goracke, RVS 12/27/2014, 2:45 PM

## 2014-12-27 NOTE — Progress Notes (Signed)
Utilization review completed.  

## 2014-12-28 ENCOUNTER — Observation Stay (HOSPITAL_BASED_OUTPATIENT_CLINIC_OR_DEPARTMENT_OTHER): Payer: Medicare Other

## 2014-12-28 DIAGNOSIS — R55 Syncope and collapse: Secondary | ICD-10-CM | POA: Diagnosis not present

## 2014-12-28 DIAGNOSIS — R0600 Dyspnea, unspecified: Secondary | ICD-10-CM | POA: Diagnosis not present

## 2014-12-28 DIAGNOSIS — R0602 Shortness of breath: Secondary | ICD-10-CM

## 2014-12-28 DIAGNOSIS — I10 Essential (primary) hypertension: Secondary | ICD-10-CM | POA: Diagnosis not present

## 2014-12-28 LAB — HEMOGLOBIN A1C
HEMOGLOBIN A1C: 5.8 % — AB (ref 4.8–5.6)
Mean Plasma Glucose: 120 mg/dL

## 2014-12-28 LAB — TSH: TSH: 1.451 u[IU]/mL (ref 0.350–4.500)

## 2014-12-28 LAB — CORTISOL: Cortisol, Plasma: 15.8 ug/dL

## 2014-12-28 MED ORDER — REGADENOSON 0.4 MG/5ML IV SOLN
0.4000 mg | Freq: Once | INTRAVENOUS | Status: AC
  Administered 2014-12-29: 0.4 mg via INTRAVENOUS
  Filled 2014-12-28: qty 5

## 2014-12-28 NOTE — Progress Notes (Addendum)
[facility] PROGRESS NOTE  Kaitlin Watkins VVO:[id number] DOB: 09-21-1945 DOA: 12/26/2014 PCP: Ival Bible, MD  Assessment/Plan: Principal Problem:   Syncope and collapse Active Problems:   Chest pain and DOE     Essential hypertension, benign   Left flank pain   Numbness of arm   Abdominal pain     Syncope:  -Presented with two episodes of syncope. Her BP was 102/71 on admission and got as low as 85/56. She is on two blood pressure medications: losartan and HCTZ. She states she was placed on BP medications because her BP was in the 140s/80s range. We have discontinued her BP medications and will continue to monitor. She did not have orthostatic hypotension when checked on 12/26/14. Head CT and MR brain results listed below. Continue telemetry, IVF, PT/OT. -Transthoracic echocardiogram pending at the time of this dictation.  -Case discussed with cardiology, who recommended noninvasive study given her history of exertional shortness of breath. This has been scheduled for tomorrow morning. -She has not had further episodes of syncope during this hospitalization.  Chest pain and DOE:  -she had a CT scan of lungs with IV contrast that did not reveal an acute pulmonary embolism. There are findings could be consistent with previous/chronic PE. -VQ scan did not show acute PE. -Pending transthoracic echocardiogram -Plan for patient to undergo stress test in a.m.  Abdominal pain: CT abdomen is negative for acute abnormalities. Lipase 26. Continue emperic protonix for GERD.  Essential hypertension, benign: blood pressures have been low and could be the cause of syncope. Discontinue losartan and HCTZ and monitor.   Left Flank pain: DG sacrum/coccyx showed no significant abnormality. Norco prn for pain.  HLD: continue lipitor  Question microadenoma -Patient have an MRI of brain on 12/27/2014 that showed mild enlargement of pituitary gland, measuring about 8 mm.  -I have ordered a  prolactin level, growth hormone, FSH, LH and TSH to assess for the possibility of hyper secreting adenoma  Code Status: Full code Family Communication: Husband at bedside. Patient is understanding of plan of care.  Disposition Plan: anticipate discharge in the next 24 hours if studies come back negative   Consultants:  Telephone consultation made to cardiology  Procedures:  None  Antibiotics:  None  HPI/Subjective: Kaitlin Watkins is a 69 y.o. female with PMH of hypertension treated with losartan and HCTZ, hyperlipidemia, GERD, DVT, arthritis, who presented with syncope, left arm numbness, AP and left flank pain. Her first episode of syncope was after dinner outside of a restaurant, preceded by an acute onset of nausea and severe abdominal pain. Her husband states she was unconscious for less than five minutes. She fell on concrete and landed on her left flank. EMS came and she experienced her second episode of syncope. She denies ever experiencing this before. She has been experiencing DOE with minimal tasks and has had chest pain since admission that radiates to her left back. Her abdominal pain is improving. Her blood pressures have ranged from 85/56 to 124/84. She states she has had black tarry stools that were evaluated with an upper endoscopy 04/05/13 showing mild gastritis that was otherwise normal.   CT abdomen/pelvis negative for acute abnormalities. X-ray of her lumbar and sacral are negative for acute abnormalities. D-dimer elevated at 1.44. MRI showed possible microadenoma.     Objective: Filed Vitals:   12/28/14 0452  BP: 141/61  Pulse: 63  Temp: 98.1 F (36.7 C)  Resp: 18   No intake or output data in  the 24 hours ending 12/28/14 1635 Filed Weights   12/26/14 2130 12/27/14 0355  Weight: 86.183 kg (190 lb) 92 kg (202 lb 13.2 oz)    Exam:   General:  Alert and oriented, WDWN, in no acute destress  Cardiovascular: S1/S2, RRR, No murmurs, No gallops or  rubs.  Respiratory: CTAB, normal respiratory effort  Abdomen: soft, NT, ND, +BS  Musculoskeletal: no limitation of ROM, no LEE, no visualized deformities. Scar on left knee from total knee replacement in June 2015.  Data Reviewed: Basic Metabolic Panel:  Recent Labs Lab 12/26/14 2200  NA 134*  K 3.5  CL 101  CO2 23  GLUCOSE 112*  BUN 13  CREATININE 1.02*  CALCIUM 8.7*   Liver Function Tests:  Recent Labs Lab 12/26/14 2200  AST 29  ALT 28  ALKPHOS 81  BILITOT 0.4  PROT 6.4*  ALBUMIN 3.6    Recent Labs Lab 12/26/14 2200  LIPASE 26   CBC:  Recent Labs Lab 12/26/14 2200  WBC 7.6  NEUTROABS 5.8  HGB 13.0  HCT 38.5  MCV 88.1  PLT 190   CBG:  Recent Labs Lab 12/26/14 2327  GLUCAP 107*    No results found for this or any previous visit (from the past 240 hour(s)).   Studies: Dg Chest 2 View  12/27/2014   CLINICAL DATA:  Chest pain.  Syncope.  EXAM: CHEST  2 VIEW  COMPARISON:  04/17/2013.  FINDINGS: Mediastinum and hilar structures are normal. Low lung volumes with mild bibasilar subtotal atelectasis. Heart size normal. No pleural effusion pneumothorax.  IMPRESSION: Low lung volumes with mild bibasilar subsegmental atelectasis.   Electronically Signed   By: Marcello Moores  Register   On: 12/27/2014 07:23   Dg Lumbar Spine Complete  12/26/2014   CLINICAL DATA:  Acute onset of syncope. 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 Centralized lower back pain. Initial encounter.  EXAM: LUMBAR SPINE - COMPLETE 4+ VIEW  COMPARISON:  CT of the abdomen and pelvis performed 04/09/2013  FINDINGS: There is no evidence of fracture or subluxation. Vertebral bodies demonstrate normal height and alignment. Intervertebral disc spaces are preserved. The visualized neural foramina are grossly unremarkable in appearance, though difficult to fully assess at L5-S1.  The visualized bowel gas pattern is unremarkable in appearance; air and stool are noted within the colon. The sacroiliac joints are within  normal limits.  IMPRESSION: No evidence of fracture or subluxation along the lumbar spine.   Electronically Signed   By: Garald Balding M.D.   On: 12/26/2014 22:43   Dg Sacrum/coccyx  12/26/2014   CLINICAL DATA:  Acute coccygeal pain after fall onto patio tonight. Initial encounter.  EXAM: SACRUM AND COCCYX - 2+ VIEW  COMPARISON:  None.  FINDINGS: There is no evidence of fracture or other focal bone lesions. Sacroiliac joints appear normal.  IMPRESSION: No significant abnormality seen in the sacrum or coccyx.   Electronically Signed   By: Marijo Conception, M.D.   On: 12/26/2014 22:42   Ct Head Wo Contrast  12/27/2014   CLINICAL DATA:  Syncope and fall. Concern for TIA. Initial encounter.  EXAM: CT HEAD WITHOUT CONTRAST  TECHNIQUE: Contiguous axial images were obtained from the base of the skull through the vertex without intravenous contrast.  COMPARISON:  Maxillofacial CT performed 05/16/2012  FINDINGS: There is no evidence of acute infarction, mass lesion, or intra- or extra-axial hemorrhage on CT.  The posterior fossa, including the cerebellum, brainstem and fourth ventricle, is within normal limits. The third and  lateral ventricles, and basal ganglia are unremarkable in appearance. The cerebral hemispheres are symmetric in appearance, with normal gray-white differentiation. No mass effect or midline shift is seen.  There is no evidence of fracture; visualized osseous structures are unremarkable in appearance. The orbits are within normal limits. The paranasal sinuses and mastoid air cells are well-aerated. No significant soft tissue abnormalities are seen.  IMPRESSION: No evidence of traumatic intracranial injury or fracture.   Electronically Signed   By: Garald Balding M.D.   On: 12/27/2014 04:44   Ct Angio Chest Pe W/cm &/or Wo Cm  12/27/2014   CLINICAL DATA:  69 year old female with shortness of Breath. History of deep venous thrombosis not presently on anti coagulation. Initial encounter.  EXAM: CT  ANGIOGRAPHY CHEST WITH CONTRAST  TECHNIQUE: Multidetector CT imaging of the chest was performed using the standard protocol during bolus administration of intravenous contrast. Multiplanar CT image reconstructions and MIPs were obtained to evaluate the vascular anatomy.  CONTRAST:  141mL OMNIPAQUE IOHEXOL 350 MG/ML SOLN  COMPARISON:  CT Abdomen and Pelvis 0136 hours today, and earlier.  FINDINGS: Good contrast bolus timing in the pulmonary arterial tree. Mild respiratory motion artifact. Small low-density web in the right lower lobe pulmonary artery on series 5, image 137. This might reflect a small area of chronic pulmonary embolus. However, no other focal filling defect identified in the pulmonary arterial tree to suggest the presence of acute pulmonary embolism.  Mosaic attenuation in the lungs plus dependent pulmonary opacity bilaterally. No pleural effusion. Major airways are patent.  Cardiomegaly. No pericardial effusion. There is trace calcified coronary artery atherosclerosis evident (series 5, image 123).  No mediastinal lymphadenopathy. Negative thyroid. Negative axillary lymphadenopathy. Stable visualized upper abdominal viscera from earlier today.  No acute osseous abnormality identified.  Review of the MIP images confirms the above findings.  IMPRESSION: 1. No evidence of acute pulmonary embolus, but there is a small web in the right lower lobe pulmonary artery suspicious for prior/chronic PE. 2. Cardiomegaly with atelectasis and mosaic attenuation in the lungs. The latter might reflect chronic pulmonary small vessel disease in light of #1.   Electronically Signed   By: Genevie Ann M.D.   On: 12/27/2014 14:51   Mr Brain Wo Contrast  12/27/2014   CLINICAL DATA:  Chest pain.  Left arm numbness.  EXAM: MRI HEAD WITHOUT CONTRAST  TECHNIQUE: Multiplanar, multiecho pulse sequences of the brain and surrounding structures were obtained without intravenous contrast.  COMPARISON:  Head CT from earlier the same day   FINDINGS: Calvarium and upper cervical spine: No focal marrow signal abnormality.  Orbits: No significant findings.  Sinuses and Mastoids: Clear. Mastoid and middle ears are clear.  Brain: No acute abnormality such as acute infarct, hemorrhage, hydrocephalus, or mass lesion. No evidence of large vessel occlusion. Cerebral volume and white matter is within normal limits.  Enlarged pituitary gland for age, measuring 8 mm and upwardly convex. A tiny T2 hyperintense, presumably cystic focus present in the right posterior gland on axial imaging. There is no contact of the chiasm.  IMPRESSION: 1. No acute finding.  No explanation for extremity numbness. 2. Mild enlargement of the pituitary gland with cystic change. Microadenoma could have this appearance. Recommend endocrine correlation and imaging follow-up.   Electronically Signed   By: Monte Fantasia M.D.   On: 12/27/2014 08:22   Ct Abdomen Pelvis W Contrast  12/27/2014   CLINICAL DATA:  Patient with syncopal episode. Status post fall, landing on the sacral area.  Epigastric and periumbilical pain.  EXAM: CT ABDOMEN AND PELVIS WITH CONTRAST  TECHNIQUE: Multidetector CT imaging of the abdomen and pelvis was performed using the standard protocol following bolus administration of intravenous contrast.  CONTRAST:  29mL OMNIPAQUE IOHEXOL 300 MG/ML SOLN, 78mL OMNIPAQUE IOHEXOL 300 MG/ML SOLN  COMPARISON:  CT 04/09/2013  FINDINGS: Lower chest: Dependent atelectasis within the bilateral lower lobes. Normal heart size.  Hepatobiliary: Liver is normal in size and contour without focal hepatic lesion identified.  Pancreas: Unremarkable  Spleen: Unremarkable  Adrenals/Urinary Tract: Adrenal glands are unremarkable. Kidneys enhance symmetrically with contrast. No hydronephrosis. Urinary bladder is unremarkable.  Stomach/Bowel: Sigmoid colonic diverticulosis. No CT evidence for acute diverticulitis. No abnormal bowel wall thickening or evidence for bowel obstruction.   Vascular/Lymphatic: Normal caliber abdominal aorta. Peripheral calcified atherosclerotic plaque. No retroperitoneal lymphadenopathy.  Other: Status post hysterectomy.  Musculoskeletal: Lower lumbar spine degenerative changes. No aggressive or acute appearing osseous lesions.  IMPRESSION: No acute process within the abdomen or pelvis.  Sigmoid colonic diverticulosis without evidence for acute diverticulitis.   Electronically Signed   By: Lovey Newcomer M.D.   On: 12/27/2014 02:14   Nm Pulmonary Perf And Vent  12/27/2014   CLINICAL DATA:  Evaluate for pulmonary embolus. Patient complains of shortness of breath, syncope and lightheadedness since 12/26/2014. History of DVT. Elevated D-dimer. Question chronic PE by CT.  EXAM: NUCLEAR MEDICINE VENTILATION - PERFUSION LUNG SCAN  TECHNIQUE: Ventilation images were obtained in multiple projections using inhaled aerosol Tc-33m DTPA. Perfusion images were obtained in multiple projections after intravenous injection of Tc-33m MAA.  RADIOPHARMACEUTICALS:  40.0 Technetium-49m DTPA aerosol inhalation and 6.0 Technetium-2m MAA IV  COMPARISON:  CT of the chest on 12/27/2014  FINDINGS: Ventilation: No focal ventilation defect.  Perfusion: No wedge shaped peripheral perfusion defects to suggest acute pulmonary embolism.  IMPRESSION: No evidence for acute pulmonary embolus.   Electronically Signed   By: Nolon Nations M.D.   On: 12/27/2014 18:22    Scheduled Meds: . acidophilus  1 capsule Oral BID  . aspirin EC  81 mg Oral Daily  . atorvastatin  80 mg Oral QPM  . B-complex with vitamin C  1 tablet Oral QID  . calcium carbonate  1,250 mg Oral BID WC  . cholecalciferol  1,000 Units Oral BID  . heparin  5,000 Units Subcutaneous 3 times per day  . omega-3 acid ethyl esters  1 g Oral Daily  . pantoprazole  40 mg Oral Q1200  . [START ON 12/29/2014] regadenoson  0.4 mg Intravenous Once  . vitamin C  1,000 mg Oral BID  . vitamin E  1,000 Units Oral Daily   Continuous  Infusions:   Principal Problem:   Syncope Active Problems:   GERD (gastroesophageal reflux disease)   Essential hypertension, benign   Obesity   Chondromalacia of right knee   Knee pain   Left flank pain   Numbness of arm   Abdominal pain   Epigastric abdominal pain   Syncope and collapse   Arterial hypotension    Time spent: 25 min    Kelvin Cellar MD [facility] Pager [phone]. If 7PM-7AM, please contact night-coverage at [email or web address], password [email or web address] 12/28/2014, 4:35 PM  LOS: 1 day

## 2014-12-28 NOTE — Evaluation (Signed)
Physical Therapy Evaluation Patient Details Name: Kaitlin Watkins MRN: [id number] DOB: May 23, 1946 Today's Date: 12/28/2014   History of Present Illness  Pt is a 69 y/o female with a PMH of HTN, DVT, arthritis. She presents with syncope, L arm numbness, and L flank pain. Initially, orthostatics were positive and pt was taken off 2 BP meds prior to PT eval.   Clinical Impression  Pt admitted with above diagnosis. Pt currently with functional limitations due to the deficits listed below (see PT Problem List). At the time of PT eval pt was able to perform transfers at a mod I level and ambulation at a supervision level. Pt continues to be symptomatic with gait training - see gait training details. Orthostatics negative. Pt will benefit from skilled PT to increase their independence and safety with mobility to allow discharge to the venue listed below.       Follow Up Recommendations No PT follow up    Equipment Recommendations  None recommended by PT    Recommendations for Other Services       Precautions / Restrictions Precautions Precautions: Fall Precaution Comments: Tailbone very sore from fall Restrictions Weight Bearing Restrictions: No      Mobility  Bed Mobility Overal bed mobility: Modified Independent             General bed mobility comments: No physical assist required for supine<>sit.  Transfers Overall transfer level: Modified independent Equipment used: None             General transfer comment: No assist required, and no unsteadiness/LOB noted.  Ambulation/Gait Ambulation/Gait assistance: Supervision Ambulation Distance (Feet): 250 Feet Assistive device: None Gait Pattern/deviations: Step-through pattern;Decreased stride length;Trunk flexed Gait velocity: WFL Gait velocity interpretation: at or above normal speed for age/gender General Gait Details: Pt was able to ambulate in the halls well with no LOB noted. Close supervision was provided as pt  was occasionally unsteady, however could recover independently. Pt symptomatic, describing SOB, head pressure/lightheadedness, and nausea. During this time O2 sats were 95% on RA, HR 78bpm. SBP upon return to the room was 157.  Stairs            Wheelchair Mobility    Modified Rankin (Stroke Patients Only)       Balance Overall balance assessment: No apparent balance deficits (not formally assessed)                                           Pertinent Vitals/Pain Pain Assessment: 0-10 Pain Score: 5  Pain Location: Tailbone where pt landed when she fell. 5/10 pain at rest. 10/10 pain with enough pressure Pain Descriptors / Indicators: Sharp Pain Intervention(s): Limited activity within patient's tolerance;Monitored during session;Repositioned    Home Living Family/patient expects to be discharged to:: Private residence Living Arrangements: Spouse/significant other Available Help at Discharge: Family;Available 24 hours/day Type of Home: House Home Access: Stairs to enter Entrance Stairs-Rails: None Entrance Stairs-Number of Steps: 1 Home Layout: One level Home Equipment: Walker - 2 wheels;Cane - single point;Bedside commode;Shower seat      Prior Function Level of Independence: Independent               Hand Dominance   Dominant Hand: Right    Extremity/Trunk Assessment   Upper Extremity Assessment: Defer to OT evaluation;RUE deficits/detail RUE Deficits / Details: Pt reports that she has tingling in her L arm  from the shoulder to the elbow. Does not appear to have any strength or range deficits         Lower Extremity Assessment: Overall WFL for tasks assessed      Cervical / Trunk Assessment: Normal  Communication   Communication: No difficulties  Cognition Arousal/Alertness: Awake/alert Behavior During Therapy: WFL for tasks assessed/performed Overall Cognitive Status: Within Functional Limits for tasks assessed                       General Comments      Exercises        Assessment/Plan    PT Assessment Patient needs continued PT services  PT Diagnosis Difficulty walking   PT Problem List Decreased strength;Decreased range of motion;Decreased activity tolerance;Decreased balance;Decreased mobility;Decreased knowledge of use of DME;Decreased safety awareness;Decreased knowledge of precautions;Cardiopulmonary status limiting activity  PT Treatment Interventions DME instruction;Gait training;Stair training;Functional mobility training;Therapeutic activities;Therapeutic exercise;Neuromuscular re-education;Patient/family education   PT Goals (Current goals can be found in the Care Plan section) Acute Rehab PT Goals Patient Stated Goal: Return to walking up to 5 miles a day PT Goal Formulation: With patient/family Time For Goal Achievement: 01/04/15 Potential to Achieve Goals: Good    Frequency Min 3X/week   Barriers to discharge        Co-evaluation               End of Session Equipment Utilized During Treatment: Gait belt Activity Tolerance: Patient tolerated treatment well Patient left: in bed;with call bell/phone within reach;with family/visitor present Nurse Communication: Mobility status    Functional Assessment Tool Used: Clinical judgement Functional Limitation: Mobility: Walking and moving around Mobility: Walking and Moving Around Current Status [phone]): At least 1 percent but less than 20 percent impaired, limited or restricted Mobility: Walking and Moving Around Goal Status [phone]): At least 1 percent but less than 20 percent impaired, limited or restricted    Time: 1051-1120 PT Time Calculation (min) (ACUTE ONLY): 29 min   Charges:   PT Evaluation $Initial PT Evaluation Tier I: 1 Procedure PT Treatments $Gait Training: 8-22 mins   PT G Codes:   PT G-Codes **NOT FOR INPATIENT CLASS** Functional Assessment Tool Used: Clinical judgement Functional Limitation:  Mobility: Walking and moving around Mobility: Walking and Moving Around Current Status ([id number]): At least 1 percent but less than 20 percent impaired, limited or restricted Mobility: Walking and Moving Around Goal Status [phone]): At least 1 percent but less than 20 percent impaired, limited or restricted    Rolinda Roan 12/28/2014, 11:45 AM   Rolinda Roan, PT, DPT Acute Rehabilitation Services Pager: [phone]

## 2014-12-28 NOTE — Progress Notes (Signed)
  Echocardiogram 2D Echocardiogram has been performed.  Bobbye Charleston 12/28/2014, 5:15 PM

## 2014-12-28 NOTE — Plan of Care (Signed)
Problem: Consults Goal: General Medical Patient Education See Patient Education Module for specific education.  Educated on stroke symptoms
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
## 2014-12-29 ENCOUNTER — Observation Stay (HOSPITAL_COMMUNITY): Payer: Medicare Other

## 2014-12-29 DIAGNOSIS — R0602 Shortness of breath: Secondary | ICD-10-CM | POA: Diagnosis not present

## 2014-12-29 DIAGNOSIS — B009 Herpesviral infection, unspecified: Secondary | ICD-10-CM | POA: Insufficient documentation

## 2014-12-29 DIAGNOSIS — R079 Chest pain, unspecified: Secondary | ICD-10-CM | POA: Diagnosis not present

## 2014-12-29 DIAGNOSIS — R55 Syncope and collapse: Secondary | ICD-10-CM | POA: Diagnosis not present

## 2014-12-29 DIAGNOSIS — M810 Age-related osteoporosis without current pathological fracture: Secondary | ICD-10-CM | POA: Insufficient documentation

## 2014-12-29 LAB — NM MYOCAR MULTI W/SPECT W/WALL MOTION / EF
CHL CUP MPHR: 150 {beats}/min
CHL CUP NUCLEAR SSS: 9
CHL CUP RESTING HR STRESS: 70 {beats}/min
CSEPHR: 84 %
CSEPPHR: 126 {beats}/min
Estimated workload: 1 METS
Exercise duration (min): 0 min
Exercise duration (sec): 0 s
LHR: 0.3
LV sys vol: 17 mL
LVDIAVOL: 62 mL
SDS: 2
SRS: 7
TID: 0.83

## 2014-12-29 MED ORDER — TECHNETIUM TC 99M SESTAMIBI GENERIC - CARDIOLITE
10.0000 | Freq: Once | INTRAVENOUS | Status: AC | PRN
  Administered 2014-12-29: 10 via INTRAVENOUS

## 2014-12-29 MED ORDER — SODIUM CHLORIDE 0.9 % IJ SOLN
80.0000 mg | INTRAVENOUS | Status: AC
  Administered 2014-12-29: 80 mg via INTRAVENOUS
  Filled 2014-12-29: qty 3.2

## 2014-12-29 MED ORDER — ACETAMINOPHEN 325 MG PO TABS: 650.0000 mg | ORAL_TABLET | Freq: Four times a day (QID) | ORAL | Status: DC | PRN

## 2014-12-29 MED ORDER — TECHNETIUM TC 99M SESTAMIBI - CARDIOLITE
30.0000 | Freq: Once | INTRAVENOUS | Status: AC | PRN
  Administered 2014-12-29: 30 via INTRAVENOUS

## 2014-12-29 MED ORDER — REGADENOSON 0.4 MG/5ML IV SOLN
INTRAVENOUS | Status: AC
  Administered 2014-12-29: 0.4 mg via INTRAVENOUS
  Filled 2014-12-29: qty 5

## 2014-12-29 NOTE — Progress Notes (Signed)
   Titus Mould presented for a lexiscan cardiolite today.  No immediate complications.  Stress imaging pending.  Murray Hodgkins, NP 12/29/2014, 11:13 AM

## 2014-12-29 NOTE — Discharge Summary (Signed)
Physician Discharge Summary  Kaitlin Watkins YTK:[id number] DOB: 04-09-1946 DOA: 12/26/2014  PCP: Ival Bible, MD  Admit date: 12/26/2014 Discharge date: 12/29/2014  Time spent: 35 minutes  Recommendations for Outpatient Follow-up:  1. Please follow up on blood pressures, she presented with syncope, found to be hypotensive with SBP's in the 80's. She reported taking HCTZ 25 mg PO q daily along with Cozaar. She was discharged on Cozaar only.  2. Please follow up on Prolactin level. Radiology reporting mild enlargement of pituitary gland measuring 65mm. Lab work was pending at the time of discharge.  3. Exertional dyspnea. Unclear etiology. Had an extensive work up including CT of lungs, 2D Echo and Stress Test all of which were unremarkable.   Discharge Diagnoses:  Principal Problem:   Syncope Active Problems:   GERD (gastroesophageal reflux disease)   Essential hypertension, benign   Obesity   Chondromalacia of right knee   Knee pain   Left flank pain   Numbness of arm   Abdominal pain   Epigastric abdominal pain   Syncope and collapse   Arterial hypotension   Chest pain   SOB (shortness of breath)   Discharge Condition: Stable  Diet recommendation: Heart Healthy  [facility] Weights   12/26/14 2130 12/27/14 0355  Weight: 86.183 kg (190 lb) 92 kg (202 lb 13.2 oz)    History of present illness:  Kaitlin Watkins is a 69 y.o. female with PMH of hypertension, hyperlipidemia, GERD, DVT, arthritis, who presents with syncope, left arm numbness, AP and left flank pain.  He reports that she had 2 episodes of syncope last night. The first episode happened after she finished dinner, gone to the restroom and urinated. It lasted for a few seconds. It is associated with nausea, abdominal pain and lightheadedness. EMS was called. Initially she refused transport, but when she got up she had another syncopal episode for a brief few seconds. Patient did not have chest pain, shortness breath,  unilateral weakness, vision change or hearing loss. She has very mild left arm and shoulder numbness. When she fell, she hit her tailbone and she is complaining of low back and tailbone pain.  Regarding her abdominal pain, it is located in the central and epgastric area. It is 8/10, constant, aching and nonradiating. No known aggravating or alleviating factors. She does not have symptoms of UTI. Pt also reports having pain over left flank area, which radiated into the left back. The pain is 4 out of 10 in severity. It is nonpleuritic. No shortness of breath. No tenderness over calf areas. 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  In ED, patient was found to have active troponin, negative urinalysis, lactate of 1.08, lipase 26, temperature normal, electrolytes okay. CT abdomen/pelvis negative for acute abnormalities. X-ray of her lumbar and sacral are negative for acute abnormalities.  Hospital Course:  Patient is a pleasant 69 year old female with a history of hypertension, gastroesophageal reflux disease, history of DVT, admitted to medicine service on 12/26/2014 when she presented with complaints of syncope. She reported having 2 syncopal spells the evening of admission at a restaurant. She reported having nausea associate with abdominal pain prior to passing out. Patient also complains of dizziness and exertion. She had several low blood pressures on admission, as low as 85/56, as I suspected that hypotension may have caused syncope. Antihypertensive agents have been discontinued. With regard to shortness of breath she was further worked up with a CT scan of lungs which did not show  evidence of acute pulmonary embolism. There was a small wed in the right lower lobe artery suspicious for chronic pulmonary embolism. CT also mention cardiomegaly. She has further workup with a transthoracic echocardiogram that showed an EF of 55-60% with grade 1 diastolic dysfunction. On exam lungs were clear to  auscultation bilaterally. She did not appear to have clinical evidence of volume overload. VQ scan did not show evidence of acute pulmonary embolism. Given history of chest pain and syncope she was also worked up with a stress test that did not reveal evidence of pharmacologically-induced ischemia, no evidence of prior infarction.   Patient had reported taking 25 mg of hydrochlorothiazide daily along with 50 mg of Cozaar. She reports checking blood pressures at home, particularly when she gets dizzy. She reports having systolic blood pressures in the 90s during these dizzy episodes. I instructed her to stop taking hydrochlorothiazide. On the day of discharge her systolic blood pressures or the 140s for which she was only discharged on Cozaar 50 mg by mouth daily.  Procedures:  Transthoracic echocardiogram impression: EF of 55-60% with grade 1 diastolic dysfunction   Stress test impression: No evidence of pharmacologically-induced ischemia, no evidence of prior infarction.   Discharge Exam: Filed Vitals:   12/29/14 1132  BP: 168/101  Pulse: 96  Temp:   Resp:     General: Patient is awake and alert, just completed stress test. She denies chest pain, asking for a meal. Cardiovascular: Regular rate and rhythm normal S1-S2 no murmurs rubs gallops Respiratory: Clear to auscultation bilaterally no wheezing rhonchi or rales Abdomen: Soft nontender nondistended positive bowel sounds Extremities: No edema  Discharge Instructions   Discharge Instructions    Call MD for:  difficulty breathing, headache or visual disturbances    Complete by:  As directed      Call MD for:  extreme fatigue    Complete by:  As directed      Call MD for:  hives    Complete by:  As directed      Call MD for:  persistant dizziness or light-headedness    Complete by:  As directed      Call MD for:  persistant nausea and vomiting    Complete by:  As directed      Call MD for:  redness, tenderness, or signs of  infection (pain, swelling, redness, odor or green/yellow discharge around incision site)    Complete by:  As directed      Call MD for:  severe uncontrolled pain    Complete by:  As directed      Call MD for:  temperature >100.4    Complete by:  As directed      Call MD for:    Complete by:  As directed      Diet - low sodium heart healthy    Complete by:  As directed      Increase activity slowly    Complete by:  As directed           Current Discharge Medication List    START taking these medications   Details  acetaminophen (TYLENOL) 325 MG tablet Take 2 tablets (650 mg total) by mouth every 6 (six) hours as needed for mild pain, moderate pain, fever or headache. Qty: 30 tablet, Refills: 0      CONTINUE these medications which have NOT CHANGED   Details  Ascorbic Acid (VITAMIN C) 1000 MG tablet Take 1,000 mg by mouth 2 (two) times daily.  aspirin EC 81 MG tablet Take 81 mg by mouth daily.    atorvastatin (LIPITOR) 80 MG tablet Take 80 mg by mouth every evening.    B Complex Vitamins (VITAMIN B COMPLEX PO) Take 1 Applicatorful by mouth 4 (four) times daily.    Calcium Carbonate (CALCIUM 600 PO) Take 1 tablet by mouth 2 (two) times daily.    cholecalciferol (VITAMIN D) 1000 UNITS tablet Take 1,000 Units by mouth 2 (two) times daily.    Coenzyme Q10 (CO Q-10) 200 MG CAPS Take 200 mg by mouth daily.    glucosamine-chondroitin 500-400 MG tablet Take 1 tablet by mouth 2 (two) times daily.    HYDROcodone-acetaminophen (NORCO) 10-325 MG per tablet Take 1-2 tablets by mouth every 6 (six) hours as needed for moderate pain.     losartan (COZAAR) 50 MG tablet Take 1 tablet (50 mg total) by mouth daily. Qty: 180 tablet, Refills: 1   Associated Diagnoses: Essential hypertension, benign    Melatonin 3 MG TABS Take 3 mg by mouth at bedtime as needed (sleep).     Naltrexone-Bupropion HCl (CONTRAVE PO) Take 2 tablets by mouth 2 (two) times daily.    Omega-3 Fatty Acids (FISH  OIL) 1200 MG CAPS Take 1 capsule by mouth 2 (two) times daily.    Probiotic Product (PROBIOTIC PO) Take 1 tablet by mouth 2 (two) times daily.    vitamin E 1000 UNIT capsule Take 1,000 Units by mouth every morning.      STOP taking these medications     hydrochlorothiazide (HYDRODIURIL) 25 MG tablet        Allergies  Allergen Reactions  . Influenza A (H1n1) Monoval Vac Anaphylaxis   Follow-up Information    Follow up with Ival Bible, MD In 1 week.   Specialty:  Family Medicine   Contact information:   [place] [address] [phone]        The results of significant diagnostics from this hospitalization (including imaging, microbiology, ancillary and laboratory) are listed below for reference.    Significant Diagnostic Studies: Dg Chest 2 View  12/27/2014   CLINICAL DATA:  Chest pain.  Syncope.  EXAM: CHEST  2 VIEW  COMPARISON:  04/17/2013.  FINDINGS: Mediastinum and hilar structures are normal. Low lung volumes with mild bibasilar subtotal atelectasis. Heart size normal. No pleural effusion pneumothorax.  IMPRESSION: Low lung volumes with mild bibasilar subsegmental atelectasis.   Electronically Signed   By: Marcello Moores  Register   On: 12/27/2014 07:23   Dg Lumbar Spine Complete  12/26/2014   CLINICAL DATA:  Acute onset of syncope. 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 Centralized lower back pain. Initial encounter.  EXAM: LUMBAR SPINE - COMPLETE 4+ VIEW  COMPARISON:  CT of the abdomen and pelvis performed 04/09/2013  FINDINGS: There is no evidence of fracture or subluxation. Vertebral bodies demonstrate normal height and alignment. Intervertebral disc spaces are preserved. The visualized neural foramina are grossly unremarkable in appearance, though difficult to fully assess at L5-S1.  The visualized bowel gas pattern is unremarkable in appearance; air and stool are noted within the colon. The sacroiliac joints are within normal limits.  IMPRESSION: No evidence of  fracture or subluxation along the lumbar spine.   Electronically Signed   By: Garald Balding M.D.   On: 12/26/2014 22:43   Dg Sacrum/coccyx  12/26/2014   CLINICAL DATA:  Acute coccygeal pain after fall onto patio tonight. Initial encounter.  EXAM: SACRUM AND COCCYX - 2+ VIEW  COMPARISON:  None.  FINDINGS: There is no evidence of fracture or other focal bone lesions. Sacroiliac joints appear normal.  IMPRESSION: No significant abnormality seen in the sacrum or coccyx.   Electronically Signed   By: Marijo Conception, M.D.   On: 12/26/2014 22:42   Ct Head Wo Contrast  12/27/2014   CLINICAL DATA:  Syncope and fall. Concern for TIA. Initial encounter.  EXAM: CT HEAD WITHOUT CONTRAST  TECHNIQUE: Contiguous axial images were obtained from the base of the skull through the vertex without intravenous contrast.  COMPARISON:  Maxillofacial CT performed 05/16/2012  FINDINGS: There is no evidence of acute infarction, mass lesion, or intra- or extra-axial hemorrhage on CT.  The posterior fossa, including the cerebellum, brainstem and fourth ventricle, is within normal limits. The third and lateral ventricles, and basal ganglia are unremarkable in appearance. The cerebral hemispheres are symmetric in appearance, with normal gray-white differentiation. No mass effect or midline shift is seen.  There is no evidence of fracture; visualized osseous structures are unremarkable in appearance. The orbits are within normal limits. The paranasal sinuses and mastoid air cells are well-aerated. No significant soft tissue abnormalities are seen.  IMPRESSION: No evidence of traumatic intracranial injury or fracture.   Electronically Signed   By: Garald Balding M.D.   On: 12/27/2014 04:44   Ct Angio Chest Pe W/cm &/or Wo Cm  12/27/2014   CLINICAL DATA:  69 year old female with shortness of Breath. History of deep venous thrombosis not presently on anti coagulation. Initial encounter.  EXAM: CT ANGIOGRAPHY CHEST WITH CONTRAST  TECHNIQUE:  Multidetector CT imaging of the chest was performed using the standard protocol during bolus administration of intravenous contrast. Multiplanar CT image reconstructions and MIPs were obtained to evaluate the vascular anatomy.  CONTRAST:  164mL OMNIPAQUE IOHEXOL 350 MG/ML SOLN  COMPARISON:  CT Abdomen and Pelvis 0136 hours today, and earlier.  FINDINGS: Good contrast bolus timing in the pulmonary arterial tree. Mild respiratory motion artifact. Small low-density web in the right lower lobe pulmonary artery on series 5, image 137. This might reflect a small area of chronic pulmonary embolus. However, no other focal filling defect identified in the pulmonary arterial tree to suggest the presence of acute pulmonary embolism.  Mosaic attenuation in the lungs plus dependent pulmonary opacity bilaterally. No pleural effusion. Major airways are patent.  Cardiomegaly. No pericardial effusion. There is trace calcified coronary artery atherosclerosis evident (series 5, image 123).  No mediastinal lymphadenopathy. Negative thyroid. Negative axillary lymphadenopathy. Stable visualized upper abdominal viscera from earlier today.  No acute osseous abnormality identified.  Review of the MIP images confirms the above findings.  IMPRESSION: 1. No evidence of acute pulmonary embolus, but there is a small web in the right lower lobe pulmonary artery suspicious for prior/chronic PE. 2. Cardiomegaly with atelectasis and mosaic attenuation in the lungs. The latter might reflect chronic pulmonary small vessel disease in light of #1.   Electronically Signed   By: Genevie Ann M.D.   On: 12/27/2014 14:51   Mr Brain Wo Contrast  12/27/2014   CLINICAL DATA:  Chest pain.  Left arm numbness.  EXAM: MRI HEAD WITHOUT CONTRAST  TECHNIQUE: Multiplanar, multiecho pulse sequences of the brain and surrounding structures were obtained without intravenous contrast.  COMPARISON:  Head CT from earlier the same day  FINDINGS: Calvarium and upper cervical  spine: No focal marrow signal abnormality.  Orbits: No significant findings.  Sinuses and Mastoids: Clear. Mastoid and middle ears are clear.  Brain: No acute  abnormality such as acute infarct, hemorrhage, hydrocephalus, or mass lesion. No evidence of large vessel occlusion. Cerebral volume and white matter is within normal limits.  Enlarged pituitary gland for age, measuring 8 mm and upwardly convex. A tiny T2 hyperintense, presumably cystic focus present in the right posterior gland on axial imaging. There is no contact of the chiasm.  IMPRESSION: 1. No acute finding.  No explanation for extremity numbness. 2. Mild enlargement of the pituitary gland with cystic change. Microadenoma could have this appearance. Recommend endocrine correlation and imaging follow-up.   Electronically Signed   By: Monte Fantasia M.D.   On: 12/27/2014 08:22   Ct Abdomen Pelvis W Contrast  12/27/2014   CLINICAL DATA:  Patient with syncopal episode. Status post fall, landing on the sacral area. Epigastric and periumbilical pain.  EXAM: CT ABDOMEN AND PELVIS WITH CONTRAST  TECHNIQUE: Multidetector CT imaging of the abdomen and pelvis was performed using the standard protocol following bolus administration of intravenous contrast.  CONTRAST:  79mL OMNIPAQUE IOHEXOL 300 MG/ML SOLN, 25mL OMNIPAQUE IOHEXOL 300 MG/ML SOLN  COMPARISON:  CT 04/09/2013  FINDINGS: Lower chest: Dependent atelectasis within the bilateral lower lobes. Normal heart size.  Hepatobiliary: Liver is normal in size and contour without focal hepatic lesion identified.  Pancreas: Unremarkable  Spleen: Unremarkable  Adrenals/Urinary Tract: Adrenal glands are unremarkable. Kidneys enhance symmetrically with contrast. No hydronephrosis. Urinary bladder is unremarkable.  Stomach/Bowel: Sigmoid colonic diverticulosis. No CT evidence for acute diverticulitis. No abnormal bowel wall thickening or evidence for bowel obstruction.  Vascular/Lymphatic: Normal caliber abdominal  aorta. Peripheral calcified atherosclerotic plaque. No retroperitoneal lymphadenopathy.  Other: Status post hysterectomy.  Musculoskeletal: Lower lumbar spine degenerative changes. No aggressive or acute appearing osseous lesions.  IMPRESSION: No acute process within the abdomen or pelvis.  Sigmoid colonic diverticulosis without evidence for acute diverticulitis.   Electronically Signed   By: Lovey Newcomer M.D.   On: 12/27/2014 02:14   Nm Myocar Multi W/spect W/wall Motion / Ef  12/29/2014    The study is normal.  This is a low risk study.  The left ventricular ejection fraction is hyperdynamic (>65%).  Nuclear stress EF: 72%.    Nm Pulmonary Perf And Vent  12/27/2014   CLINICAL DATA:  Evaluate for pulmonary embolus. Patient complains of shortness of breath, syncope and lightheadedness since 12/26/2014. History of DVT. Elevated D-dimer. Question chronic PE by CT.  EXAM: NUCLEAR MEDICINE VENTILATION - PERFUSION LUNG SCAN  TECHNIQUE: Ventilation images were obtained in multiple projections using inhaled aerosol Tc-31m DTPA. Perfusion images were obtained in multiple projections after intravenous injection of Tc-27m MAA.  RADIOPHARMACEUTICALS:  40.0 Technetium-73m DTPA aerosol inhalation and 6.0 Technetium-81m MAA IV  COMPARISON:  CT of the chest on 12/27/2014  FINDINGS: Ventilation: No focal ventilation defect.  Perfusion: No wedge shaped peripheral perfusion defects to suggest acute pulmonary embolism.  IMPRESSION: No evidence for acute pulmonary embolus.   Electronically Signed   By: Nolon Nations M.D.   On: 12/27/2014 18:22   Mm Screening Breast Tomo Bilateral  12/09/2014   CLINICAL DATA:  Screening.  EXAM: DIGITAL SCREENING BILATERAL MAMMOGRAM WITH 3D TOMO WITH CAD  COMPARISON:  Previous exam(s).  ACR Breast Density Category c: The breast tissue is heterogeneously dense, which may obscure small masses.  FINDINGS: There are no findings suspicious for malignancy. Images were processed with CAD.   IMPRESSION: No mammographic evidence of malignancy. A result letter of this screening mammogram will be mailed directly to the patient.  RECOMMENDATION: Screening mammogram  in one year. (Code:[id number])  BI-RADS CATEGORY  1: Negative.   Electronically Signed   By: Evangeline Dakin M.D.   On: 12/09/2014 17:49    Microbiology: No results found for this or any previous visit (from the past 240 hour(s)).   Labs: Basic Metabolic Panel:  Recent Labs Lab 12/26/14 2200  NA 134*  K 3.5  CL 101  CO2 23  GLUCOSE 112*  BUN 13  CREATININE 1.02*  CALCIUM 8.7*   Liver Function Tests:  Recent Labs Lab 12/26/14 2200  AST 29  ALT 28  ALKPHOS 81  BILITOT 0.4  PROT 6.4*  ALBUMIN 3.6    Recent Labs Lab 12/26/14 2200  LIPASE 26   No results for input(s): AMMONIA in the last 168 hours. CBC:  Recent Labs Lab 12/26/14 2200  WBC 7.6  NEUTROABS 5.8  HGB 13.0  HCT 38.5  MCV 88.1  PLT 190   Cardiac Enzymes: No results for input(s): CKTOTAL, CKMB, CKMBINDEX, TROPONINI in the last 168 hours. BNP: BNP (last 3 results) No results for input(s): BNP in the last 8760 hours.  ProBNP (last 3 results) No results for input(s): PROBNP in the last 8760 hours.  CBG:  Recent Labs Lab 12/26/14 2327  GLUCAP 107*       Signed:  Kelvin Cellar  [facility] 12/29/2014, 2:22 PM

## 2014-12-29 NOTE — Progress Notes (Signed)
12/29/14 1540 nsg Patient discharged to home per wheelchair accompanied by NT and husband. Discharge instructions given, patient understood with no questions.

## 2014-12-30 ENCOUNTER — Other Ambulatory Visit: Payer: Self-pay

## 2014-12-31 LAB — PROLACTIN: Prolactin: 16.4 ng/mL (ref 4.8–23.3)

## 2014-12-31 LAB — LUTEINIZING HORMONE: LH: 43.7 m[IU]/mL

## 2015-01-02 LAB — GROWTH HORMONE: Growth Hormone: <0.1 ng/mL (ref 0.0–10.0)

## 2015-01-02 LAB — ACTH: C206 ACTH: 14.9 pg/mL (ref 7.2–63.3)

## 2015-01-02 LAB — FOLLICLE STIMULATING HORMONE: FSH: 90.3 m[IU]/mL

## 2015-01-15 NOTE — Progress Notes (Signed)
12/28/14 1156  SLP Visit Information  SLP Received On 12/27/14  SLP Time Calculation  SLP Start Time (ACUTE ONLY) 1136  SLP Stop Time (ACUTE ONLY) 1150  SLP Time Calculation (min) (ACUTE ONLY) 14 min  Subjective  Subjective The patient was seen sitting upright in her bed.    Patient/Family Stated Goal To feel healthy,.  General Information  Other Pertinent Information Kaitlin Watkins is a 69 y.o. female with PMH of hypertension, hyperlipidemia, GERD, DVT, arthritis, who presents with syncope, left arm numbness, AP and left flank pain.  She reports that she had 2 episodes of syncope last night. The first episode happened after she finished dinner, gone to the restroom and urinated. It lasted for a few seconds. It is associated with nausea, abdominal pain and lightheadedness. EMS was called. Initially she refused transport, but when she got up she had another syncopal episode for a brief few seconds. Patient did not have chest pain, shortness breath, unilateral weakness, vision change or hearing loss. She has very mild left arm and shoulder numbness. When she fell, she hit her tailbone and she is complaining of low back and tailbone pain.  MRI is negative for any acute changes.  Chart review does not indicate any previous ST workup.    Prior Functional Status  Cognitive/Linguistic Baseline WFL  Type of Home House  Lives With Spouse  Available Help at Discharge Family;Available 24 hours/day  Pain Assessment  Pain Assessment 0-10  Pain Score 4  Pain Location Headache  Pain Intervention(s) Repositioned;Relaxation  Cognition  Overall Cognitive Status Within Functional Limits for tasks assessed  Arousal/Alertness Awake/alert  Orientation Level Oriented X4  Attention Sustained  Sustained Attention Appears intact  Memory Appears intact  Awareness Appears intact  Problem Solving Appears intact  Safety/Judgment Appears intact  Auditory Comprehension  Overall Auditory Comprehension Appears  within functional limits for tasks assessed  Commands [facility]  Conversation Complex  Reading Comprehension  Reading Status WFL  Expression  Primary Mode of Expression Verbal  Verbal Expression  Overall Verbal Expression Appears within functional limits for tasks assessed  Initiation No impairment  Automatic Speech Name;Social Response  Level of Generative/Spontaneous Verbalization Conversation  Repetition No impairment  Naming No impairment  Pragmatics No impairment  Non-Verbal Means of Communication Not applicable  Written Expression  Dominant Hand Right  Written Expression WFL  Oral Motor/Sensory Function  Overall Oral Motor/Sensory Function Appears within functional limits for tasks assessed  Motor Speech  Overall Motor Speech Appears within functional limits for tasks assessed  Respiration WFL  Phonation Normal  Resonance [facility]  Articulation [facility]  Intelligibility Intelligible  Motor Planning [facility]  Motor Speech Errors NA  SLP - End of Session  Patient left in bed;with family/visitor present  Assessment  Clinical Impression Statement Cognitive/linguistic evaluation was completed.  The patient scored a 29/30 on the Mini Mental State Exam.  The patient was able to solve problems and have a good conversation regarding current events and what led to her hospitalization.  Acute ST needs are not identified.    SLP Recommendation/Assessment Patient does not need any further Speech Lanaguage Pathology Services  No Skilled Speech Therapy Patient is independent with all cognitive/linguistic skills  SLP Recommendations  Follow up Recommendations None  SLP Equipment None recommended by SLP  Individuals Consulted  Consulted and Agree with Results and Recommendations Patient  SLP G-Codes **NOT FOR INPATIENT CLASS**  Functional Assessment Tool Used skilled clinical judgement  Functional Limitations Attention  Attention Current Status (G8185)  [facility]  Attention Goal Status (F0722) [facility]  Attention  Discharge Status (V7505) [place]  SLP Evaluations  $ SLP Speech Visit 1 Procedure  SLP Evaluations  $ SLP EVAL LANGUAGE/SOUND PRODUCTION 1 Procedure  Gabriel Rainwater MA, CCC-SLP [phone]

## 2015-02-05 ENCOUNTER — Encounter: Payer: Self-pay | Admitting: Gastroenterology

## 2015-02-13 DIAGNOSIS — E236 Other disorders of pituitary gland: Secondary | ICD-10-CM | POA: Insufficient documentation

## 2015-11-05 ENCOUNTER — Other Ambulatory Visit: Payer: Self-pay

## 2015-11-05 DIAGNOSIS — Z1231 Encounter for screening mammogram for malignant neoplasm of breast: Secondary | ICD-10-CM

## 2015-11-25 ENCOUNTER — Other Ambulatory Visit: Payer: Self-pay | Admitting: Family Medicine

## 2015-11-25 DIAGNOSIS — N644 Mastodynia: Secondary | ICD-10-CM

## 2015-11-27 ENCOUNTER — Other Ambulatory Visit: Payer: Self-pay | Admitting: Family Medicine

## 2015-11-27 ENCOUNTER — Telehealth: Payer: Self-pay | Admitting: Internal Medicine

## 2015-11-27 DIAGNOSIS — E2839 Other primary ovarian failure: Secondary | ICD-10-CM

## 2015-11-28 ENCOUNTER — Encounter: Payer: Self-pay | Admitting: Internal Medicine

## 2015-11-28 ENCOUNTER — Ambulatory Visit (INDEPENDENT_AMBULATORY_CARE_PROVIDER_SITE_OTHER): Payer: Medicare Other | Admitting: Internal Medicine

## 2015-11-28 VITALS — BP 130/78 | HR 73 | Ht 65.0 in | Wt 224.0 lb

## 2015-11-28 DIAGNOSIS — R55 Syncope and collapse: Secondary | ICD-10-CM | POA: Diagnosis not present

## 2015-11-28 DIAGNOSIS — R42 Dizziness and giddiness: Secondary | ICD-10-CM | POA: Diagnosis not present

## 2015-11-28 NOTE — Patient Instructions (Signed)
Your physician recommends that you continue on your current medications as directed. Please refer to the Current Medication list given to you today. You have been referred to vestibular therapy/rehabilitation for dizziness/syncope.
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
## 2015-12-02 ENCOUNTER — Other Ambulatory Visit: Payer: Medicare Other

## 2015-12-03 ENCOUNTER — Encounter: Payer: Self-pay | Admitting: Physical Therapy

## 2015-12-03 ENCOUNTER — Ambulatory Visit: Payer: Medicare Other | Attending: Internal Medicine | Admitting: Physical Therapy

## 2015-12-03 DIAGNOSIS — H8111 Benign paroxysmal vertigo, right ear: Secondary | ICD-10-CM

## 2015-12-03 DIAGNOSIS — R42 Dizziness and giddiness: Secondary | ICD-10-CM | POA: Diagnosis present

## 2015-12-03 DIAGNOSIS — R2689 Other abnormalities of gait and mobility: Secondary | ICD-10-CM

## 2015-12-03 NOTE — Telephone Encounter (Signed)
Close encounter 
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
## 2015-12-05 ENCOUNTER — Ambulatory Visit: Payer: Medicare Other | Attending: Internal Medicine | Admitting: Physical Therapy

## 2015-12-05 DIAGNOSIS — R42 Dizziness and giddiness: Secondary | ICD-10-CM | POA: Insufficient documentation

## 2015-12-05 DIAGNOSIS — R2689 Other abnormalities of gait and mobility: Secondary | ICD-10-CM | POA: Diagnosis present

## 2015-12-05 DIAGNOSIS — H8112 Benign paroxysmal vertigo, left ear: Secondary | ICD-10-CM | POA: Diagnosis present

## 2015-12-05 DIAGNOSIS — H8111 Benign paroxysmal vertigo, right ear: Secondary | ICD-10-CM | POA: Insufficient documentation

## 2015-12-05 DIAGNOSIS — M542 Cervicalgia: Secondary | ICD-10-CM | POA: Insufficient documentation
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
## 2015-12-05 NOTE — Patient Instructions (Signed)
Tip Card 1.The goal of habituation training is to assist in decreasing symptoms of vertigo, dizziness, or nausea provoked by specific head and body motions. 2.These exercises may initially increase symptoms; however, be persistent and work through symptoms. With repetition and time, the exercises will assist in reducing or eliminating symptoms. 3.Exercises should be stopped and discussed with the therapist if you experience any of the following: - Sudden change or fluctuation in hearing - New onset of ringing in the ears, or increase in current intensity - Any fluid discharge from the ear - Severe pain in neck or back - Extreme nausea  Copyright  VHI. All rights reserved.  Sit to Side-Lying   Sit on edge of bed. Lie down onto the right side and hold until dizziness stops, plus 20 seconds.  Return to sitting and wait until dizziness stops, plus 20 seconds.  Repeat to the left side. Repeat sequence 5 times per session. Do 2 sessions per day. You may stop this exercise if you experience no symptoms with exercise for 3 days in a row.   Copyright  VHI. All rights reserved.  Gaze Stabilization: Standing Feet Apart   Feet shoulder width apart (may need to hold onto stable surface), keeping eyes on target ("A") on wall at eye-level about 3 feet away, tilt head down slightly and move head side to side for 30 seconds. Repeat while moving head up and down for 30 seconds. Do 2 sessions per day.  1.Target must remain in focus, not blurry, and appear stationary while head is in motion. 2.Perform exercises with small head movements. 3.Increase speed of head motion so long as target is in focus.   Feet Apart (Compliant Surface) Head Motion - Eyes Closed    Stand with your back to a corner with a stable chair in front of you. Stand on 1 pillow with feet shoulder width apart. Close eyes and move head slowly: up and down 10 times; right to left 10 times.   Do __2__ sessions per day.

## 2015-12-09 ENCOUNTER — Ambulatory Visit: Payer: Medicare Other | Admitting: Physical Therapy

## 2015-12-09 DIAGNOSIS — H8111 Benign paroxysmal vertigo, right ear: Secondary | ICD-10-CM | POA: Diagnosis not present

## 2015-12-09 DIAGNOSIS — R42 Dizziness and giddiness: Secondary | ICD-10-CM

## 2015-12-09 NOTE — Patient Instructions (Signed)
Tip Card 1.The goal of habituation training is to assist in decreasing symptoms of vertigo, dizziness, or nausea provoked by specific head and body motions. 2.These exercises may initially increase symptoms; however, be persistent and work through symptoms. With repetition and time, the exercises will assist in reducing or eliminating symptoms. 3.Exercises should be stopped and discussed with the therapist if you experience any of the following: - Sudden change or fluctuation in hearing - New onset of ringing in the ears, or increase in current intensity - Any fluid discharge from the ear - Severe pain in neck or back - Extreme nausea   Copyright  VHI. All rights reserved.  Sit to Side-Lying   Sit on edge of bed. Lie down onto the right side and hold until dizziness stops, plus 20 seconds.  Return to sitting and wait until dizziness stops, plus 20 seconds.  Repeat to the left side. Repeat sequence 5 times per session. Do 2 sessions per day.     
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
## 2015-12-10 NOTE — Progress Notes (Signed)
Reviewed with EP Would get vestibular testing as recomm Would alos get a GXT to follow BP and HR response

## 2015-12-11 ENCOUNTER — Ambulatory Visit
Admission: RE | Admit: 2015-12-11 | Discharge: 2015-12-11 | Disposition: A | Discharge status: Home / Self Care | Payer: Medicare Other | Source: Ambulatory Visit | Attending: Family Medicine | Admitting: Family Medicine

## 2015-12-11 ENCOUNTER — Other Ambulatory Visit: Payer: Self-pay | Admitting: Family Medicine

## 2015-12-11 DIAGNOSIS — N644 Mastodynia: Secondary | ICD-10-CM

## 2015-12-11 DIAGNOSIS — E2839 Other primary ovarian failure: Secondary | ICD-10-CM

## 2015-12-12 ENCOUNTER — Other Ambulatory Visit: Payer: Self-pay | Admitting: Family Medicine

## 2015-12-12 ENCOUNTER — Ambulatory Visit: Payer: Medicare Other | Admitting: Physical Therapy

## 2015-12-12 DIAGNOSIS — R42 Dizziness and giddiness: Secondary | ICD-10-CM

## 2015-12-12 DIAGNOSIS — N644 Mastodynia: Secondary | ICD-10-CM

## 2015-12-12 DIAGNOSIS — H8112 Benign paroxysmal vertigo, left ear: Secondary | ICD-10-CM

## 2015-12-12 DIAGNOSIS — H8111 Benign paroxysmal vertigo, right ear: Secondary | ICD-10-CM | POA: Diagnosis not present
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
## 2015-12-12 NOTE — Patient Instructions (Addendum)
Tip Card 1.The goal of habituation training is to assist in decreasing symptoms of vertigo, dizziness, or nausea provoked by specific head and body motions. 2.These exercises may initially increase symptoms; however, be persistent and work through symptoms. With repetition and time, the exercises will assist in reducing or eliminating symptoms. 3.Exercises should be stopped and discussed with the therapist if you experience any of the following: - Sudden change or fluctuation in hearing - New onset of ringing in the ears, or increase in current intensity - Any fluid discharge from the ear - Severe pain in neck or back - Extreme nausea  Copyright  VHI. All rights reserved.  Sit to Side-Lying   Sit on edge of bed. Lie down onto the right side and hold until dizziness stops, plus 20 seconds. Return to sitting and wait until dizziness stops, plus 20 seconds. Repeat to the left side. Repeat sequence 5 times per session. Do 2 sessions per day.  Tip Card 1.The goal of habituation training is to assist in decreasing symptoms of vertigo, dizziness, or nausea provoked by specific head and body motions. 2.These exercises may initially increase symptoms; however, be persistent and work through symptoms. With repetition and time, the exercises will assist in reducing or eliminating symptoms. 3.Exercises should be stopped and discussed with the therapist if you experience any of the following: - Sudden change or fluctuation in hearing - New onset of ringing in the ears, or increase in current intensity - Any fluid discharge from the ear - Severe pain in neck or back - Extreme nausea  Copyright  VHI. All rights reserved.  Rolling   With pillow under head, start on back. Roll to your right side.  Hold until dizziness stops, plus 20 seconds and then roll to the left side.  Hold until dizziness stops, plus 20 seconds.  Repeat sequence 5 times per session. Do 2 sessions per day.

## 2015-12-15 ENCOUNTER — Ambulatory Visit
Admission: RE | Admit: 2015-12-15 | Discharge: 2015-12-15 | Disposition: A | Discharge status: Home / Self Care | Payer: Medicare Other | Source: Ambulatory Visit | Attending: Family Medicine | Admitting: Family Medicine

## 2015-12-15 DIAGNOSIS — N644 Mastodynia: Secondary | ICD-10-CM

## 2015-12-15 HISTORY — PX: BREAST BIOPSY: SHX20

## 2015-12-16 ENCOUNTER — Ambulatory Visit: Payer: Medicare Other

## 2015-12-16 ENCOUNTER — Encounter: Payer: Medicare Other | Admitting: Physical Therapy

## 2015-12-18 NOTE — Progress Notes (Signed)
I attempted to reach the pt by phone to make her aware that Dr Harrington Challenger would like her to have a GXT.  Home number does not have voicemail and cell phone is not accepting calls at this time.  We will attempt to contact the pt at a later time.

## 2015-12-19 ENCOUNTER — Ambulatory Visit: Payer: Medicare Other | Admitting: Physical Therapy

## 2015-12-19 ENCOUNTER — Other Ambulatory Visit: Payer: Self-pay | Admitting: *Deleted

## 2015-12-19 ENCOUNTER — Telehealth: Payer: Self-pay | Admitting: Internal Medicine

## 2015-12-19 ENCOUNTER — Telehealth: Payer: Self-pay | Admitting: *Deleted

## 2015-12-19 DIAGNOSIS — R2689 Other abnormalities of gait and mobility: Secondary | ICD-10-CM

## 2015-12-19 DIAGNOSIS — M542 Cervicalgia: Secondary | ICD-10-CM

## 2015-12-19 DIAGNOSIS — I10 Essential (primary) hypertension: Secondary | ICD-10-CM

## 2015-12-19 DIAGNOSIS — H8111 Benign paroxysmal vertigo, right ear: Secondary | ICD-10-CM | POA: Diagnosis not present

## 2015-12-19 DIAGNOSIS — R42 Dizziness and giddiness: Secondary | ICD-10-CM

## 2015-12-19 NOTE — Telephone Encounter (Signed)
f/u Pt returning RN phone call

## 2015-12-19 NOTE — Telephone Encounter (Signed)
F/u Message ° °Pt returning RN call. Please call back to discuss  °

## 2015-12-19 NOTE — Patient Instructions (Signed)
Axial Extension (Chin Tuck)    Pull chin in and lengthen back of neck. Hold __5-10__ seconds while counting out loud. Repeat __5__ times. Do __2__ sessions per day.     Flexors, Sitting    Sit, left arm behind, hand wrapped around right side of neck. Grip seat firmly with right hand and lean to left. Look back and up to the right, bending neck backward while turning head to the right. Hold __45_ seconds. Rest, then perform on opposite side. Repeat _2_ times on each side per session. Do _2__ sessions per day.  Copyright  VHI. All rights reserved.

## 2015-12-19 NOTE — Telephone Encounter (Signed)
PT AWARE NEEDS  GXT  DONE PER  DR ROSS .Adonis Housekeeper
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
## 2015-12-19 NOTE — Telephone Encounter (Signed)
SEE OTHER PHONE NOTE./CY 

## 2015-12-19 NOTE — Telephone Encounter (Signed)
-----   Message from Barkley Boards, RN sent at 12/18/2015  5:55 PM EDT -----   ----- Message -----    From: Fay Records, MD    Sent: 12/10/2015  11:19 PM      To: Rodman Key, RN    ----- Message -----    From: Fay Records, MD    Sent: 11/29/2015  11:24 PM      To: Fay Records, MD

## 2015-12-19 NOTE — Telephone Encounter (Signed)
ATTEMPTED TO CALL PT UNABLE  TO LEAVE  MESSAGE  WILL TRY LATER .Kaitlin Watkins

## 2015-12-19 NOTE — Telephone Encounter (Signed)
LM TO CALL BACK ./CY 

## 2015-12-23 ENCOUNTER — Ambulatory Visit: Payer: Medicare Other | Admitting: Physical Therapy

## 2015-12-24 ENCOUNTER — Telehealth: Payer: Self-pay | Admitting: Physical Therapy

## 2015-12-24 NOTE — Telephone Encounter (Signed)
Call patient to notify of missed PT appt on 6/20 and to make patient aware of next scheduled PT visit. No answer and unable to leave voicemail.   Billie Ruddy, PT, DPT [facility] [address] [place], [place], [address] Phone: [phone]   Fax:  [phone] 12/24/2015, 8:57 AM

## 2015-12-25 ENCOUNTER — Ambulatory Visit (INDEPENDENT_AMBULATORY_CARE_PROVIDER_SITE_OTHER): Payer: Medicare Other

## 2015-12-25 DIAGNOSIS — I10 Essential (primary) hypertension: Secondary | ICD-10-CM | POA: Diagnosis not present

## 2015-12-25 LAB — EXERCISE TOLERANCE TEST
CHL RATE OF PERCEIVED EXERTION: 17
CSEPHR: 87 %
CSEPPHR: 131 {beats}/min
Estimated workload: 4.6 METS
Exercise duration (min): 2 min
Exercise duration (sec): 23 s
MPHR: 149 {beats}/min
Rest HR: 78 {beats}/min

## 2015-12-29 ENCOUNTER — Ambulatory Visit: Payer: Medicare Other | Admitting: Physical Therapy

## 2015-12-29 DIAGNOSIS — M542 Cervicalgia: Secondary | ICD-10-CM

## 2015-12-29 DIAGNOSIS — H8111 Benign paroxysmal vertigo, right ear: Secondary | ICD-10-CM | POA: Diagnosis not present

## 2015-12-29 DIAGNOSIS — R42 Dizziness and giddiness: Secondary | ICD-10-CM
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
## 2015-12-29 NOTE — Patient Instructions (Addendum)
Feet Apart (Compliant Surface) Head Motion - Eyes Closed    Stand with your back to a corner with a stable chair in front of you. Stand on 1 pillow with feet shoulder width apart. Close eyes and move head slowly: up and down 10 times; right to left 10 times.. Repeat _2___ times per day.   Copyright  VHI. All rights reserved.  Gaze Stabilization: Standing Feet Apart   Feet shoulder width apart, keeping eyes on target on wall 3 feet away, tilt head down slightly and move head side to side for 30 seconds. Repeat while moving head up and down for 30 seconds. Do 2 sessions per day.   Gaze Stabilization: Tip Card 1.Target must remain in focus, not blurry, and appear stationary while head is in motion. 2.Perform exercises with small head movements (45 to either side of midline). 3.Increase speed of head motion so long as target is in focus.    Axal Extension (Chin Tuck)    Pull chin in and lengthen back of neck. While keeping chin in tucked position, rotate your head to the right as far as you can go without pain. Hold for 5 seconds. Return to resting position. Pull in chin again to lengthen the back of your neck. While keeping neck in this position, rotate head to L side as far as you can go without pain. Hold for 5 seconds. Relax. Repeat. Perform 5 reps per direction, twice per day.

## 2015-12-30 ENCOUNTER — Ambulatory Visit: Payer: Medicare Other | Admitting: Physical Therapy

## 2016-01-02 ENCOUNTER — Encounter: Payer: Self-pay | Admitting: Rehabilitation

## 2016-01-02 ENCOUNTER — Ambulatory Visit: Payer: Medicare Other | Admitting: Rehabilitation

## 2016-01-02 DIAGNOSIS — R42 Dizziness and giddiness: Secondary | ICD-10-CM

## 2016-01-02 DIAGNOSIS — M542 Cervicalgia: Secondary | ICD-10-CM

## 2016-01-02 DIAGNOSIS — R2689 Other abnormalities of gait and mobility: Secondary | ICD-10-CM

## 2016-01-02 DIAGNOSIS — H8111 Benign paroxysmal vertigo, right ear: Secondary | ICD-10-CM | POA: Diagnosis not present

## 2016-01-02 NOTE — Patient Instructions (Signed)
Feet Apart (Compliant Surface) Head Motion - Eyes Closed    Stand with your back to a corner with a stable chair in front of you. Stand on 1 pillow with feet shoulder width apart. Close eyes and move head slowly: up and down 10 times; right to left 10 times.. Repeat _2___ times per day.   Copyright  VHI. All rights reserved.  Gaze Stabilization: Standing Feet Apart   Feet shoulder width apart, keeping eyes on target on wall 3 feet away, tilt head down slightly and move head side to side for 30 seconds. Repeat while moving head up and down for 30 seconds. Do 2 sessions per day.   Gaze Stabilization: Tip Card 1.Target must remain in focus, not blurry, and appear stationary while head is in motion. 2.Perform exercises with small head movements (45 to either side of midline). 3.Increase speed of head motion so long as target is in focus.    Axal Extension (Chin Tuck)    Pull chin in and lengthen back of neck. While keeping chin in tucked position, rotate your head to the right as far as you can go without pain. Hold for 5 seconds. Return to resting position. Pull in chin again to lengthen the back of your neck. While keeping neck in this position, rotate head to L side as far as you can go without pain. Hold for 5 seconds. Relax. Repeat. Perform 5 reps per direction, twice per day.  You can start lying down with a small towel rolled under the base of your skull (top part of neck) and think about flattening your neck into the towel.  Then rotate R and L and hold for 5 seconds.
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
## 2016-01-07 ENCOUNTER — Other Ambulatory Visit: Payer: Self-pay | Admitting: *Deleted

## 2016-01-07 ENCOUNTER — Telehealth: Payer: Self-pay | Admitting: *Deleted

## 2016-01-07 DIAGNOSIS — R6889 Other general symptoms and signs: Secondary | ICD-10-CM

## 2016-01-07 NOTE — Telephone Encounter (Signed)
-----   Message from Fay Records, MD sent at 12/28/2015  8:57 AM EDT ----- Stress test shows extremely poor exercise tolerance WOuld benefit from rehab or general PT to increase exercise tolernce  ? Re coverage

## 2016-01-08 ENCOUNTER — Encounter: Payer: Medicare Other | Admitting: Physical Therapy

## 2016-01-09 ENCOUNTER — Encounter: Payer: Medicare Other | Admitting: Physical Therapy

## 2016-01-13 ENCOUNTER — Encounter: Payer: Medicare Other | Admitting: Physical Therapy

## 2016-01-14 ENCOUNTER — Ambulatory Visit: Payer: Medicare Other | Attending: Internal Medicine | Admitting: Physical Therapy

## 2016-01-14 VITALS — BP 123/81 | HR 90

## 2016-01-14 DIAGNOSIS — M6281 Muscle weakness (generalized): Secondary | ICD-10-CM | POA: Diagnosis not present

## 2016-01-14 DIAGNOSIS — R42 Dizziness and giddiness: Secondary | ICD-10-CM | POA: Insufficient documentation
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
## 2016-01-16 ENCOUNTER — Ambulatory Visit: Payer: Medicare Other | Admitting: *Deleted

## 2016-01-16 DIAGNOSIS — M6281 Muscle weakness (generalized): Secondary | ICD-10-CM
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
## 2016-01-20 ENCOUNTER — Encounter: Payer: Medicare Other | Admitting: Physical Therapy

## 2016-01-21 ENCOUNTER — Encounter: Payer: Self-pay | Admitting: Physical Therapy

## 2016-01-21 ENCOUNTER — Ambulatory Visit: Payer: Medicare Other | Admitting: Physical Therapy

## 2016-01-21 DIAGNOSIS — M6281 Muscle weakness (generalized): Secondary | ICD-10-CM | POA: Diagnosis not present
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
## 2016-01-23 ENCOUNTER — Ambulatory Visit: Payer: Medicare Other | Admitting: Physical Therapy

## 2016-01-23 DIAGNOSIS — M6281 Muscle weakness (generalized): Secondary | ICD-10-CM

## 2016-01-23 DIAGNOSIS — R42 Dizziness and giddiness: Secondary | ICD-10-CM
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
## 2016-01-27 ENCOUNTER — Encounter: Payer: Medicare Other | Admitting: Physical Therapy

## 2016-01-27 ENCOUNTER — Ambulatory Visit: Payer: Medicare Other | Admitting: Physical Therapy

## 2016-01-27 VITALS — HR 88

## 2016-01-27 DIAGNOSIS — M6281 Muscle weakness (generalized): Secondary | ICD-10-CM
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
## 2016-01-28 ENCOUNTER — Ambulatory Visit: Payer: Medicare Other | Admitting: Physical Therapy

## 2016-01-28 DIAGNOSIS — M6281 Muscle weakness (generalized): Secondary | ICD-10-CM | POA: Diagnosis not present
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
## 2016-01-28 NOTE — Patient Instructions (Addendum)
  Hip Flexion   Hold on to counter or chair then march legs left then right. Hold __2__ seconds. Repeat on other leg. Do __10__ repetitions, _2___ sets.  http://bt.exer.us/40   Copyright  VHI. All rights reserved.  Half Squat to Chair   Stand with feet shoulder width apart. Push buttocks backward and lower slowly, sitting in chair lightly and returning to standing position. Complete _2_ sets of 10_ repetitions. Perform __2-3_ sessions per day.       Standing, place feet apart. Hold arms out for balance or use support. Rise up on toes. Hold _3___ seconds, then lower. Repeat immediately. Repeat __10__ times. Do __2__ sessions per day.

## 2016-02-02 ENCOUNTER — Ambulatory Visit: Payer: Medicare Other | Admitting: Physical Therapy

## 2016-02-02 VITALS — HR 92

## 2016-02-02 DIAGNOSIS — M6281 Muscle weakness (generalized): Secondary | ICD-10-CM | POA: Diagnosis not present
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
## 2016-02-04 ENCOUNTER — Encounter: Payer: Self-pay | Admitting: Physical Therapy

## 2016-02-04 ENCOUNTER — Ambulatory Visit: Payer: Medicare Other | Attending: Internal Medicine | Admitting: Physical Therapy

## 2016-02-04 DIAGNOSIS — M6281 Muscle weakness (generalized): Secondary | ICD-10-CM | POA: Insufficient documentation
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
## 2016-02-11 ENCOUNTER — Encounter: Payer: Self-pay | Admitting: Physical Therapy

## 2016-02-11 ENCOUNTER — Ambulatory Visit: Payer: Medicare Other | Admitting: Physical Therapy

## 2016-02-11 DIAGNOSIS — M6281 Muscle weakness (generalized): Secondary | ICD-10-CM | POA: Diagnosis not present
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
## 2016-02-13 ENCOUNTER — Ambulatory Visit: Payer: Medicare Other | Admitting: Physical Therapy

## 2016-02-13 ENCOUNTER — Encounter: Payer: Self-pay | Admitting: Physical Therapy

## 2016-02-13 VITALS — HR 90

## 2016-02-13 DIAGNOSIS — M6281 Muscle weakness (generalized): Secondary | ICD-10-CM
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
## 2016-02-16 ENCOUNTER — Encounter: Payer: Self-pay | Admitting: Physical Therapy

## 2016-02-16 ENCOUNTER — Ambulatory Visit: Payer: Medicare Other | Admitting: Physical Therapy

## 2016-02-16 DIAGNOSIS — M6281 Muscle weakness (generalized): Secondary | ICD-10-CM
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
## 2016-02-19 ENCOUNTER — Encounter: Payer: Self-pay | Admitting: Physical Therapy

## 2016-02-19 ENCOUNTER — Ambulatory Visit: Payer: Medicare Other | Admitting: Physical Therapy

## 2016-02-19 DIAGNOSIS — M6281 Muscle weakness (generalized): Secondary | ICD-10-CM | POA: Diagnosis not present
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
## 2016-02-19 NOTE — Patient Instructions (Signed)
  Step-Down / Step-Up   Stand on stair step or __6__ inch stool. Slowly bend left leg, lowering other foot to floor. Return by straightening front leg. Repeat __10__ times per set. Do __2-3__ sets per session. Do __2-3__ sessions per day.   Strengthening: Hip Abduction (Side-Lying)   Tighten muscles on front of left thigh, then lift leg __5__ inches from surface, keeping knee locked.  Repeat __10__ times per set. Do __2__ sets per session. Do _2___ sessions per day.
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
## 2016-02-24 ENCOUNTER — Encounter: Payer: Medicare Other | Admitting: Physical Therapy

## 2016-02-27 ENCOUNTER — Encounter: Payer: Medicare Other | Admitting: Physical Therapy

## 2016-03-01 DIAGNOSIS — R918 Other nonspecific abnormal finding of lung field: Secondary | ICD-10-CM | POA: Insufficient documentation

## 2016-04-20 ENCOUNTER — Other Ambulatory Visit (HOSPITAL_COMMUNITY): Payer: Self-pay | Admitting: Pulmonary Disease

## 2016-04-20 DIAGNOSIS — R0602 Shortness of breath: Secondary | ICD-10-CM

## 2016-04-21 ENCOUNTER — Other Ambulatory Visit (HOSPITAL_COMMUNITY): Payer: Self-pay | Admitting: Respiratory Therapy

## 2016-04-21 DIAGNOSIS — R0602 Shortness of breath: Secondary | ICD-10-CM

## 2016-04-29 ENCOUNTER — Ambulatory Visit (HOSPITAL_COMMUNITY)
Admission: RE | Admit: 2016-04-29 | Discharge: 2016-04-29 | Disposition: A | Discharge status: Home / Self Care | Payer: Medicare Other | Source: Ambulatory Visit | Attending: Pulmonary Disease | Admitting: Pulmonary Disease

## 2016-04-29 ENCOUNTER — Other Ambulatory Visit (HOSPITAL_COMMUNITY): Payer: Self-pay | Admitting: Pulmonary Disease

## 2016-04-29 ENCOUNTER — Encounter (HOSPITAL_COMMUNITY): Payer: Self-pay | Admitting: Radiology

## 2016-04-29 DIAGNOSIS — I071 Rheumatic tricuspid insufficiency: Secondary | ICD-10-CM | POA: Insufficient documentation

## 2016-04-29 DIAGNOSIS — R0602 Shortness of breath: Secondary | ICD-10-CM | POA: Diagnosis not present

## 2016-04-29 DIAGNOSIS — I10 Essential (primary) hypertension: Secondary | ICD-10-CM | POA: Diagnosis not present

## 2016-04-29 DIAGNOSIS — K219 Gastro-esophageal reflux disease without esophagitis: Secondary | ICD-10-CM | POA: Insufficient documentation

## 2016-04-29 LAB — PULMONARY FUNCTION TEST
DL/VA % PRED: 71 %
DL/VA: 3.51 ml/min/mmHg/L
DLCO unc % pred: 55 %
DLCO unc: 14.23 ml/min/mmHg
FEF 25-75 Post: 2.15 L/sec
FEF 25-75 Pre: 3.25 L/sec
FEF2575-%CHANGE-POST: -33 %
FEF2575-%PRED-POST: 115 %
FEF2575-%Pred-Pre: 174 %
FEV1-%CHANGE-POST: -5 %
FEV1-%PRED-PRE: 103 %
FEV1-%Pred-Post: 98 %
FEV1-POST: 2.26 L
FEV1-Pre: 2.38 L
FEV1FVC-%CHANGE-POST: 0 %
FEV1FVC-%Pred-Pre: 114 %
FEV6-%Change-Post: -4 %
FEV6-%PRED-POST: 90 %
FEV6-%Pred-Pre: 95 %
FEV6-PRE: 2.76 L
FEV6-Post: 2.63 L
FEV6FVC-%Change-Post: 0 %
FEV6FVC-%PRED-PRE: 104 %
FEV6FVC-%Pred-Post: 105 %
FVC-%CHANGE-POST: -5 %
FVC-%PRED-POST: 86 %
FVC-%PRED-PRE: 91 %
FVC-POST: 2.63 L
FVC-PRE: 2.78 L
POST FEV1/FVC RATIO: 86 %
PRE FEV6/FVC RATIO: 99 %
Post FEV6/FVC ratio: 100 %
Pre FEV1/FVC ratio: 86 %
RV % PRED: 99 %
RV: 2.26 L
TLC % pred: 99 %
TLC: 5.16 L

## 2016-04-29 LAB — POCT I-STAT CREATININE: CREATININE: 0.8 mg/dL (ref 0.44–1.00)

## 2016-04-29 MED ORDER — ALBUTEROL SULFATE (2.5 MG/3ML) 0.083% IN NEBU
2.5000 mg | INHALATION_SOLUTION | Freq: Once | RESPIRATORY_TRACT | Status: AC
  Administered 2016-04-29: 2.5 mg via RESPIRATORY_TRACT

## 2016-04-29 MED ORDER — IOPAMIDOL (ISOVUE-370) INJECTION 76%
100.0000 mL | Freq: Once | INTRAVENOUS | Status: AC | PRN
  Administered 2016-04-29: 100 mL via INTRAVENOUS

## 2016-04-29 NOTE — Progress Notes (Signed)
*  PRELIMINARY RESULTS* Echocardiogram 2D Echocardiogram has been performed.  Kaitlin Watkins 04/29/2016, 1:51 PM

## 2016-05-20 IMAGING — MR MR HEAD W/O CM
9 of 11 series · 33 of 48 positions shown · non-contrast
Comparison: Head CT from earlier the same day

CLINICAL DATA: Chest pain.  Left arm numbness.

EXAM:
MRI HEAD WITHOUT CONTRAST
TECHNIQUE: Multiplanar, multiecho pulse sequences of the brain and surrounding
structures were obtained without intravenous contrast.

[Series 3: DWI · axial · 3.0mm · 0.94mm/px · z∈[-67,+69]mm · 8 of 94 slices shown (1 of 4)]
[im 1/94]
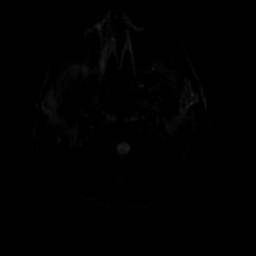
[im 14/94]
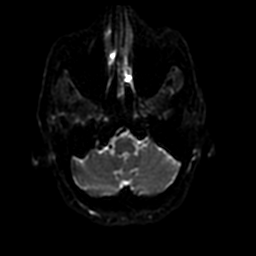
[im 27/94]
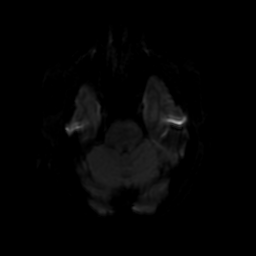
[im 40/94]
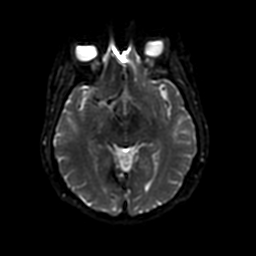
[im 54/94]
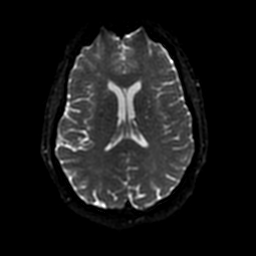
[im 67/94]
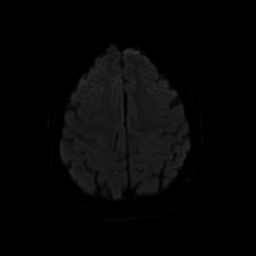
[im 80/94]
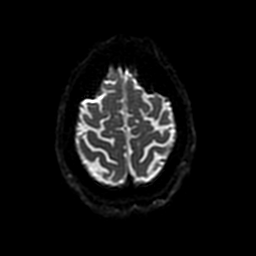
[im 94/94]
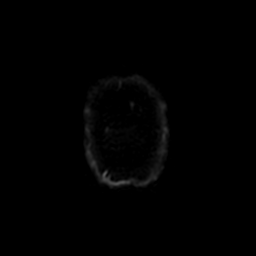

[Series 4: FLAIR · sagittal · 5.0mm · 0.47mm/px · 2 of 23 slices shown (1 of 2)]
[im 1/23]
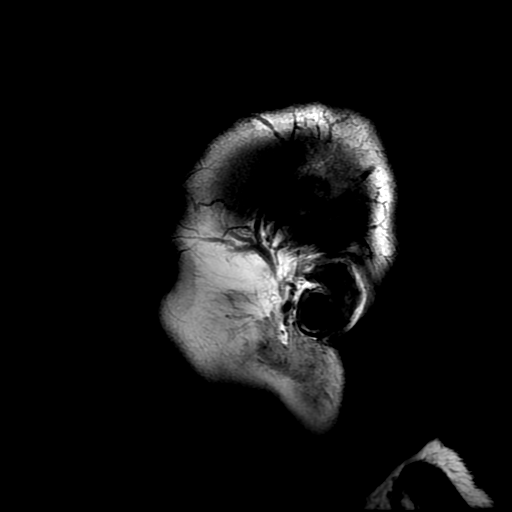
[im 23/23]
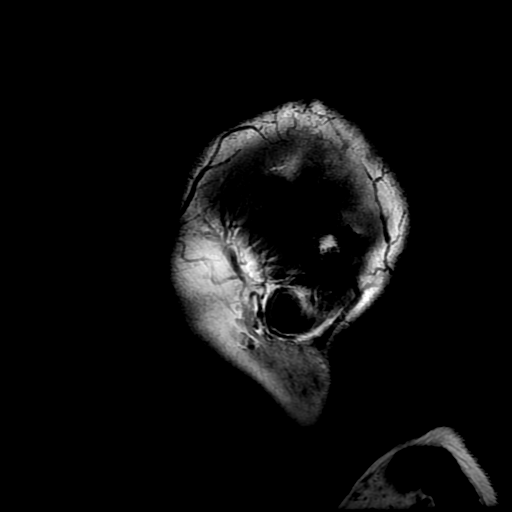

[Series 5: T2 · axial · 5.0mm · 0.47mm/px · z∈[-71,+58]mm · 2 of 23 slices shown (1 of 2)]
[im 1/23]
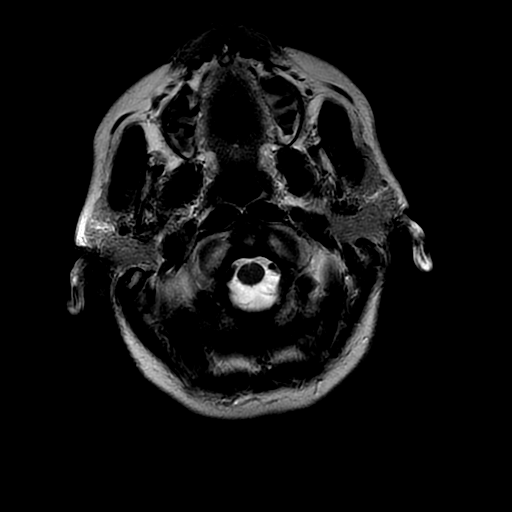
[im 23/23]
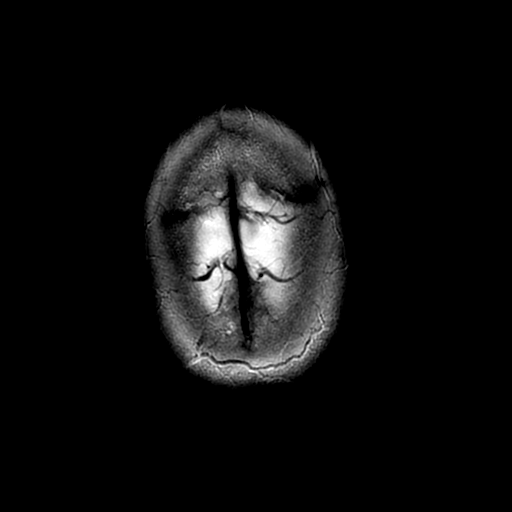

[Series 6: FLAIR · axial · 5.0mm · 0.47mm/px · z∈[-71,+58]mm · 2 of 23 slices shown (2 of 2)]
[im 1/23]
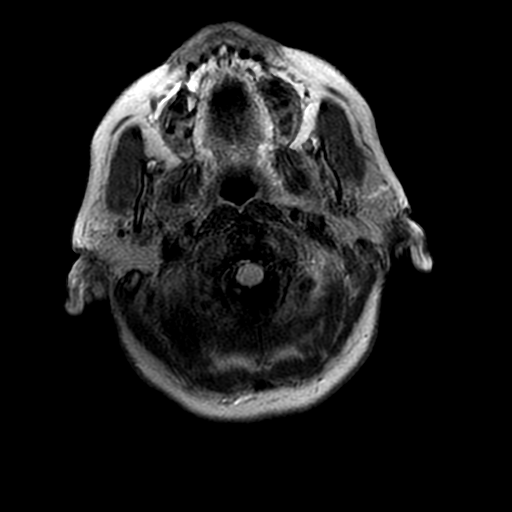
[im 23/23]
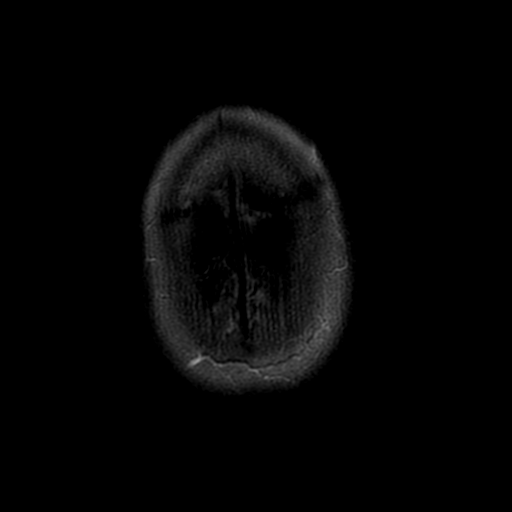

[Series 7: DWI · coronal · 5.0mm · 0.94mm/px · 5 of 64 slices shown (2 of 4)]
[im 1/64]
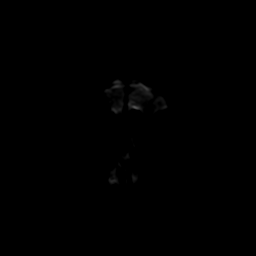
[im 16/64]
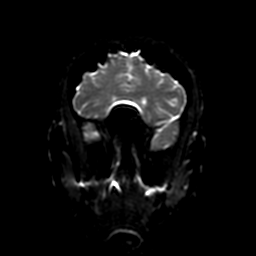
[im 32/64]
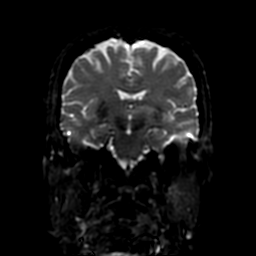
[im 48/64]
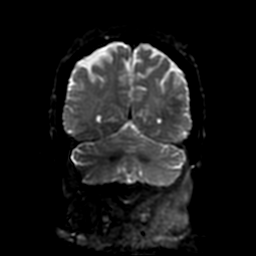
[im 64/64]
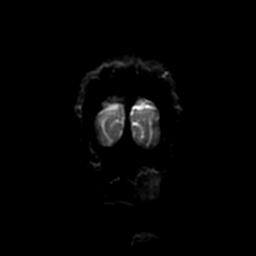

[Series 8: (person_name) · axial · 3.0mm · 0.47mm/px · z∈[-73,+4]mm · 5 of 92 slices shown]
[im 1/92]
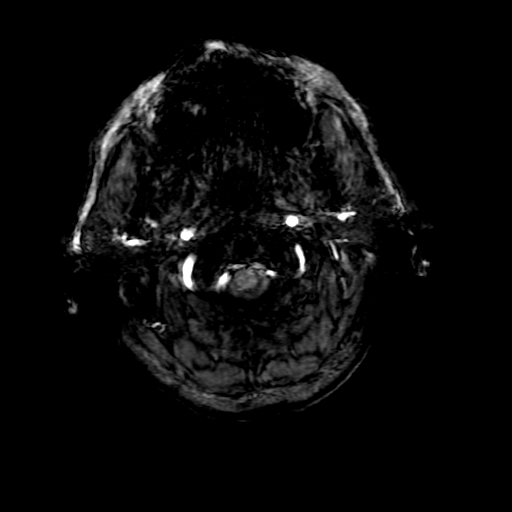
[im 14/92]
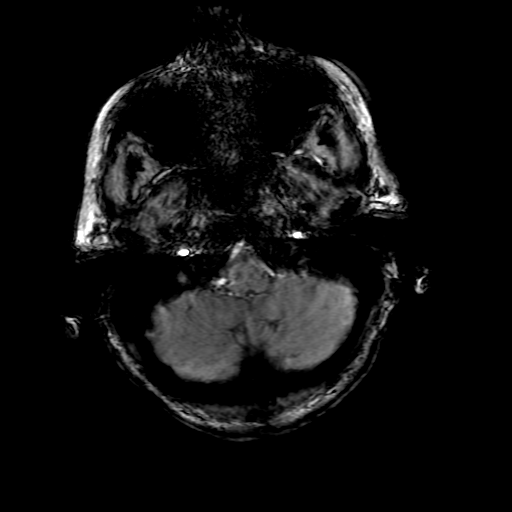
[im 27/92]
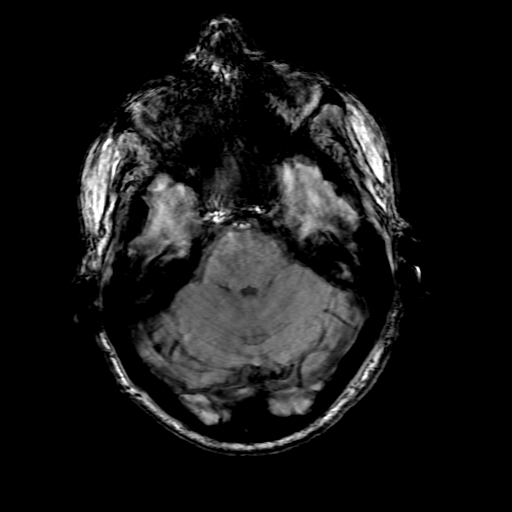
[im 40/92]
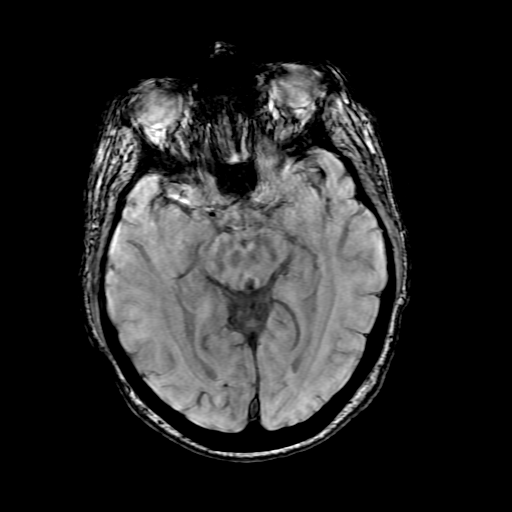
[im 53/92]
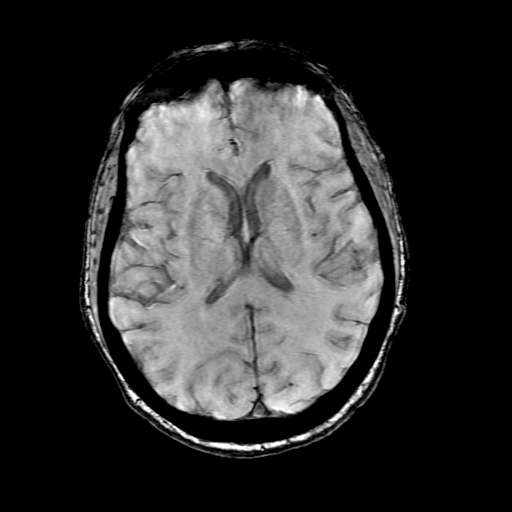

[Series 10: T2 · coronal · 5.0mm · 0.39mm/px · 2 of 27 slices shown (2 of 2)]
[im 1/27]
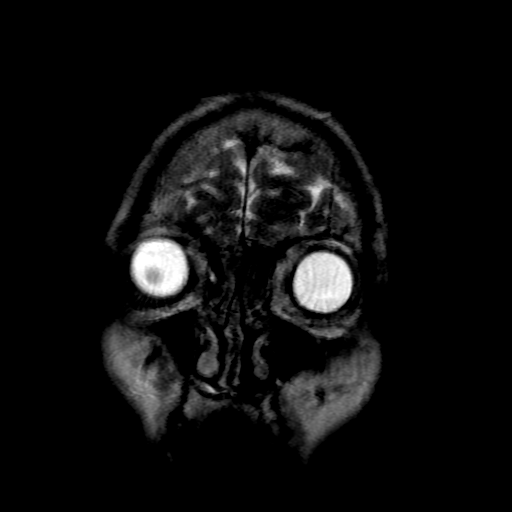
[im 27/27]
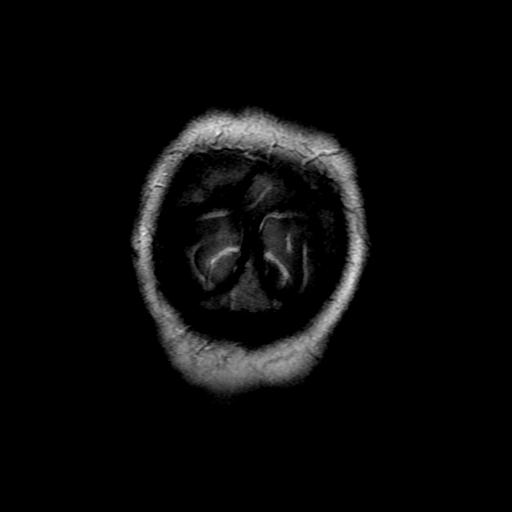

[Series 300: DWI · axial · 3.0mm · 0.94mm/px · z∈[-67,+69]mm · 4 of 47 slices shown (3 of 4)]
[im 1/47]
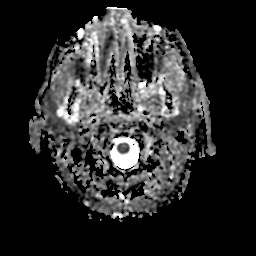
[im 16/47]
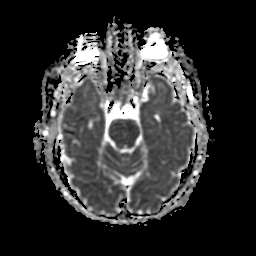
[im 31/47]
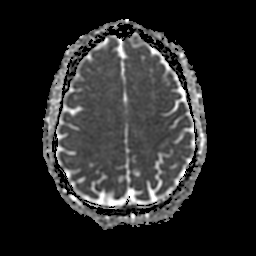
[im 47/47]
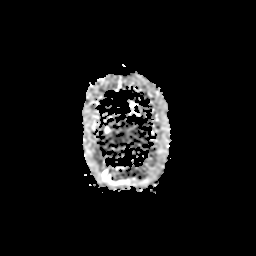

[Series 700: DWI · coronal · 5.0mm · 0.94mm/px · 3 of 32 slices shown (4 of 4)]
[im 1/32]
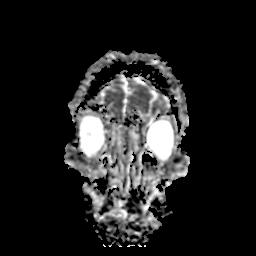
[im 16/32]
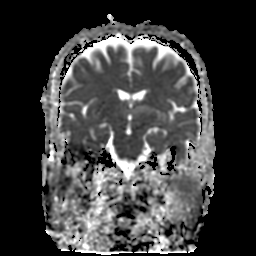
[im 32/32]
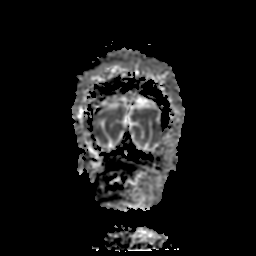

[33 of 48 positions shown; findings below may reference images not displayed]

FINDINGS: Calvarium and upper cervical spine: No focal marrow signal
abnormality.

Orbits: No significant findings.

Sinuses and Mastoids: Clear. Mastoid and middle ears are clear.

Brain: No acute abnormality such as acute infarct, hemorrhage,
hydrocephalus, or mass lesion. No evidence of large vessel
occlusion. Cerebral volume and white matter is within normal limits.

Enlarged pituitary gland for age, measuring 8 mm and upwardly
convex. A tiny T2 hyperintense, presumably cystic focus present in
the right posterior gland on axial imaging. There is no contact of
the chiasm.
IMPRESSION: 1. No acute finding.  No explanation for extremity numbness.
2. Mild enlargement of the pituitary gland with cystic change.
Microadenoma could have this appearance. Recommend endocrine
correlation and imaging follow-up.

## 2016-07-01 ENCOUNTER — Encounter (HOSPITAL_COMMUNITY): Payer: Self-pay | Admitting: *Deleted

## 2016-07-01 ENCOUNTER — Emergency Department (HOSPITAL_COMMUNITY): Payer: Medicare Other

## 2016-07-01 ENCOUNTER — Emergency Department (HOSPITAL_COMMUNITY)
Admission: EM | Admit: 2016-07-01 | Discharge: 2016-07-01 | Disposition: A | Discharge status: Home / Self Care | Payer: Medicare Other | Attending: Emergency Medicine | Admitting: Emergency Medicine

## 2016-07-01 DIAGNOSIS — Z87891 Personal history of nicotine dependence: Secondary | ICD-10-CM | POA: Insufficient documentation

## 2016-07-01 DIAGNOSIS — I10 Essential (primary) hypertension: Secondary | ICD-10-CM | POA: Insufficient documentation

## 2016-07-01 DIAGNOSIS — M545 Low back pain, unspecified: Secondary | ICD-10-CM

## 2016-07-01 DIAGNOSIS — Z79899 Other long term (current) drug therapy: Secondary | ICD-10-CM | POA: Insufficient documentation

## 2016-07-01 DIAGNOSIS — Z7982 Long term (current) use of aspirin: Secondary | ICD-10-CM | POA: Insufficient documentation

## 2016-07-01 MED ORDER — PREDNISONE 10 MG PO TABS: ORAL_TABLET | ORAL | 0 refills | Status: DC

## 2016-07-01 MED ORDER — OXYCODONE-ACETAMINOPHEN 5-325 MG PO TABS
1.0000 | ORAL_TABLET | Freq: Once | ORAL | Status: AC
  Administered 2016-07-01: 1 via ORAL
  Filled 2016-07-01: qty 1

## 2016-07-01 MED ORDER — OXYCODONE-ACETAMINOPHEN 5-325 MG PO TABS: 1.0000 | ORAL_TABLET | ORAL | 0 refills | Status: DC | PRN

## 2016-07-01 NOTE — ED Triage Notes (Signed)
Pt comes in with lower right back pain. States this started 2 months. She denies any injury. Pt states she feels like that area is tight. Pt went to urgent care on Tuesday and given a muscle relaxer and NSAIDS. Pt states she can stand but has pain when walking.

## 2016-07-01 NOTE — Discharge Instructions (Signed)
Alternate ice and heat to your back.  Follow-up with your primary care doctor or with your orthopedic specialist if the pain is not improving
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
## 2016-07-20 ENCOUNTER — Ambulatory Visit: Payer: Medicare Other | Attending: Orthopedic Surgery | Admitting: Physical Therapy

## 2016-07-20 DIAGNOSIS — G8929 Other chronic pain: Secondary | ICD-10-CM | POA: Insufficient documentation

## 2016-07-20 DIAGNOSIS — M545 Low back pain: Secondary | ICD-10-CM | POA: Insufficient documentation

## 2016-07-20 DIAGNOSIS — M6281 Muscle weakness (generalized): Secondary | ICD-10-CM | POA: Diagnosis present
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
## 2016-07-22 ENCOUNTER — Encounter: Payer: Medicare Other | Admitting: Physical Therapy

## 2016-07-27 ENCOUNTER — Encounter: Payer: Medicare Other | Admitting: Physical Therapy

## 2016-07-28 ENCOUNTER — Ambulatory Visit: Payer: Medicare Other | Admitting: Physical Therapy

## 2016-07-28 DIAGNOSIS — G8929 Other chronic pain: Secondary | ICD-10-CM

## 2016-07-28 DIAGNOSIS — M545 Low back pain: Secondary | ICD-10-CM | POA: Diagnosis not present
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
## 2016-07-29 ENCOUNTER — Ambulatory Visit: Payer: Medicare Other | Admitting: Physical Therapy

## 2016-07-29 DIAGNOSIS — M545 Low back pain: Principal | ICD-10-CM

## 2016-07-29 DIAGNOSIS — G8929 Other chronic pain: Secondary | ICD-10-CM
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
## 2016-08-02 ENCOUNTER — Ambulatory Visit: Payer: Medicare Other | Admitting: Physical Therapy

## 2016-08-02 ENCOUNTER — Encounter: Payer: Self-pay | Admitting: Physical Therapy

## 2016-08-02 DIAGNOSIS — M6281 Muscle weakness (generalized): Secondary | ICD-10-CM

## 2016-08-02 DIAGNOSIS — M545 Low back pain: Secondary | ICD-10-CM | POA: Diagnosis not present

## 2016-08-02 DIAGNOSIS — G8929 Other chronic pain: Secondary | ICD-10-CM

## 2016-08-02 NOTE — Patient Instructions (Signed)
Pelvic Tilt: Posterior - Legs Bent (Supine)   Tighten stomach and flatten back by rolling pelvis down. Hold _10___ seconds. Relax. Repeat _10-30___ times per set. Do __2__ sets per session. Do _2___ sessions per day.   Brushing Teeth    Place one foot on ledge and one hand on counter. Bend other knee slightly to keep back straight.  Copyright  VHI. All rights reserved.  Refrigerator   Squat with knees apart to reach lower shelves and drawers.   Copyright  VHI. All rights reserved.  Laundry [employer] down and hold basket close to stand. Use leg muscles to do the work.   Copyright  VHI. All rights reserved.  Housework - Vacuuming   Hold the vacuum with arm held at side. Step back and forth to move it, keeping head up. Avoid twisting.   Copyright  VHI. All rights reserved.  Housework - Wiping   Position yourself as close as possible to reach work surface. Avoid straining your back.    Place pillow between knees. Use cervical support under neck and a roll around waist as needed.   Copyright  VHI. All rights reserved.  Log Roll   Lying on back, bend left knee and place left arm across chest. Roll all in one movement to the right. Reverse to roll to the left. Always move as one unit.   Copyright  VHI. All rights reserved.  Stand to Sit / Sit to Stand   To sit: Bend knees to lower self onto front edge of chair, then scoot back on seat. To stand: Reverse sequence by placing one foot forward, and scoot to front of seat. Use rocking motion to stand up.  Copyright  VHI. All rights reserved.  Posture - Standing   Good posture is important. Avoid slouching and forward head thrust. Maintain curve in low back and align ears over shoul- ders, hips over ankles.   Copyright  VHI. All rights reserved.  Posture - Sitting   Sit upright, head facing forward. Try using a roll to support lower back. Keep shoulders relaxed, and avoid rounded back. Keep hips  level with knees. Avoid crossing legs for long periods.
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
## 2016-08-04 ENCOUNTER — Ambulatory Visit: Payer: Medicare Other | Admitting: Physical Therapy

## 2016-08-04 DIAGNOSIS — M545 Low back pain: Principal | ICD-10-CM

## 2016-08-04 DIAGNOSIS — G8929 Other chronic pain: Secondary | ICD-10-CM
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
## 2016-08-06 ENCOUNTER — Ambulatory Visit: Payer: Medicare Other | Attending: Orthopedic Surgery | Admitting: Physical Therapy

## 2016-08-06 DIAGNOSIS — M6281 Muscle weakness (generalized): Secondary | ICD-10-CM | POA: Diagnosis present

## 2016-08-06 DIAGNOSIS — G8929 Other chronic pain: Secondary | ICD-10-CM | POA: Diagnosis present

## 2016-08-06 DIAGNOSIS — M545 Low back pain: Secondary | ICD-10-CM | POA: Insufficient documentation
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
## 2016-08-09 ENCOUNTER — Ambulatory Visit: Payer: Medicare Other | Admitting: Physical Therapy

## 2016-08-09 DIAGNOSIS — G8929 Other chronic pain: Secondary | ICD-10-CM

## 2016-08-09 DIAGNOSIS — M545 Low back pain: Principal | ICD-10-CM
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
## 2016-08-09 NOTE — Patient Instructions (Addendum)
Trigger Point Dry Needling  . What is Trigger Point Dry Needling (DN)? o DN is a physical therapy technique used to treat muscle pain and dysfunction. Specifically, DN helps deactivate muscle trigger points (muscle knots).  o A thin filiform needle is used to penetrate the skin and stimulate the underlying trigger point. The goal is for a local twitch response (LTR) to occur and for the trigger point to relax. No medication of any kind is injected during the procedure.   . What Does Trigger Point Dry Needling Feel Like?  o The procedure feels different for each individual patient. Some patients report that they do not actually feel the needle enter the skin and overall the process is not painful. Very mild bleeding may occur. However, many patients feel a deep cramping in the muscle in which the needle was inserted. This is the local twitch response.   Marland Kitchen How Will I feel after the treatment? o Soreness is normal, and the onset of soreness may not occur for a few hours. Typically this soreness does not last longer than two days.  o Bruising is uncommon, however; ice can be used to decrease any possible bruising.  o In rare cases feeling tired or nauseous after the treatment is normal. In addition, your symptoms may get worse before they get better, this period will typically not last longer than 24 hours.   . What Can I do After My Treatment? o Increase your hydration by drinking more water for the next 24 hours. o You may place ice or heat on the areas treated that have become sore, however, do not use heat on inflamed or bruised areas. Heat often brings more relief post needling. o You can continue your regular activities, but vigorous activity is not recommended initially after the treatment for 24 hours. o DN is best combined with other physical therapy such as strengthening, stretching, and other therapies.    Precautions:  In some cases, dry needling is done over the lung field. While rare,  there is a risk of pneumothorax (punctured lung). Because of this, if you ever experience shortness of breath on exertion, difficulty taking a deep breath, chest pain or a dry cough following dry needling, you should report to an emergency room and tell them that you have been dry needled over the thorax.   Iliotibial Band Stretch, Side-Lying   Lie on side, back to edge of bed, top arm in front. Allow top leg to drape behind over edge. Hold 1-5 minutes.  Do _2-3 sessions per day.  Side Bend    Stand with feet shoulder width apart, arms at sides OR SIT IN A CHAIR. Bend to one side, reaching down toward knee with hand. Hold 30-60 seconds. Return to standing. Repeat sequence __3__ times per session. Do __2__ sessions per day.  Variation: Hands on hips Arms at shoulder level Arms over head  Copyright  VHI. All rights reserved.   Madelyn Flavors, PT 08/09/16 2:08 PM [facility] [place], [place], [address] Phone: [phone]   Fax:  [phone]

## 2016-08-11 ENCOUNTER — Encounter: Payer: Self-pay | Admitting: Physical Therapy

## 2016-08-11 ENCOUNTER — Ambulatory Visit: Payer: Medicare Other | Admitting: Physical Therapy

## 2016-08-11 DIAGNOSIS — M545 Low back pain: Secondary | ICD-10-CM | POA: Diagnosis not present

## 2016-08-11 DIAGNOSIS — G8929 Other chronic pain: Secondary | ICD-10-CM

## 2016-08-11 DIAGNOSIS — M6281 Muscle weakness (generalized): Secondary | ICD-10-CM
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
## 2016-08-11 NOTE — Patient Instructions (Signed)
Pelvic Tilt: Posterior - Legs Bent (Supine)   Tighten stomach and flatten back by rolling pelvis down. Hold _10___ seconds. Relax. Repeat _10-30___ times per set. Do __2__ sets per session. Do _2___ sessions per day.   . Straight Leg Raise   Tighten stomach and slowly raise locked right leg __4__ inches from floor. Repeat __10-30__ times per set. Do __2__ sets per session. Do __2__ sessions per day.   Bent Leg Lift (Hook-Lying)   Tighten stomach and slowly raise right leg _5___ inches from floor. Keep trunk rigid. Hold _3___ seconds. Repeat _10___ times per set. Do ___2-3_ sets per session. Do __2__ sessions per day.    

## 2016-08-13 ENCOUNTER — Ambulatory Visit: Payer: Medicare Other | Admitting: Physical Therapy

## 2016-08-13 DIAGNOSIS — M545 Low back pain: Principal | ICD-10-CM

## 2016-08-13 DIAGNOSIS — G8929 Other chronic pain: Secondary | ICD-10-CM
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
## 2016-08-16 ENCOUNTER — Ambulatory Visit: Payer: Medicare Other | Admitting: Physical Therapy

## 2016-08-16 DIAGNOSIS — M545 Low back pain: Principal | ICD-10-CM

## 2016-08-16 DIAGNOSIS — G8929 Other chronic pain: Secondary | ICD-10-CM
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
## 2016-08-18 ENCOUNTER — Ambulatory Visit: Payer: Medicare Other | Admitting: Physical Therapy

## 2016-08-18 DIAGNOSIS — M545 Low back pain: Principal | ICD-10-CM

## 2016-08-18 DIAGNOSIS — G8929 Other chronic pain: Secondary | ICD-10-CM
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
## 2016-08-20 ENCOUNTER — Ambulatory Visit: Payer: Medicare Other | Admitting: Physical Therapy

## 2016-08-20 DIAGNOSIS — M545 Low back pain: Secondary | ICD-10-CM | POA: Diagnosis not present

## 2016-08-20 DIAGNOSIS — G8929 Other chronic pain: Secondary | ICD-10-CM
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
## 2016-08-24 ENCOUNTER — Ambulatory Visit: Payer: Medicare Other | Admitting: Physical Therapy

## 2016-08-24 DIAGNOSIS — M6281 Muscle weakness (generalized): Secondary | ICD-10-CM

## 2016-08-24 DIAGNOSIS — G8929 Other chronic pain: Secondary | ICD-10-CM

## 2016-08-24 DIAGNOSIS — M545 Low back pain: Secondary | ICD-10-CM
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
## 2016-08-24 NOTE — Patient Instructions (Signed)
Lower abdominal/core stability exercises  1. Practice your breathing technique: Inhale through your nose expanding your belly and rib cage. Try not to breathe into your chest. Exhale slowly and gradually out your mouth feeling a sense of softness to your body. Practice multiple times. This can be performed unlimited.  2. Finding the lower abdominals. Laying on your back with the knees bent, place your fingers just below your belly button. Using your breathing technique from above, on your exhale gently pull the belly button away from your fingertips without tensing any other muscles. Practice this 5x. Next, as you exhale, draw belly button inwards and hold onto it...then feel as if you are pulling that muscle across your pelvis like you are tightening a belt. This can be hard to do at first so be patient and practice. Do 5-10 reps 1-3 x day. Always recognize quality over quantity; if your abdominal muscles become tired you will notice you may tighten/contract other muscles. This is the time to take a break.   Practice this first laying on your back, then in sitting, progressing to standing and finally adding it to all your daily movements.   3. Finding your pelvic floor. Using the breathing technique above, when your exhale, this time draw your pelvic floor muscles up as if you were attempting to stop the flow of urination. Be careful NOT to tense any other muscles. This can be hard, BE PATIENT. Try to hold up to 10 seconds repeating 10x. Try 2x a day. Once you feel you are doing this well, add this contraction to exercise #2. First contracting your pelvic floor followed by lower abdominals.   4. Adding leg movements. Add the following leg movements to challenge your ability to keep your core stable:  1. Single leg drop outs: Laying on your back with knees bent feet flat. Inhale,  dropping one knee outward KEEPING YOUR PELVIS STILL. Exhale as you bring the leg back, simultaneously performing your lower  abdominal contraction. Do 5-10 on each leg.   2. Marching: While keeping your pelvis still, lift the right foot a few inches, put it down then lift left foot. This will mimic a march. Start slow to establish control. Once you have control you may speed it up. Do 10-20x. You MUST keep your lower abdominlas contracted while you march. Breathe naturally    3. Single leg slides: Inhale while you slowly slide one leg out keeping your pelvis still. Only slide your leg as far as you can keep your pelvis still. Exhale as you bring the leg back to the start, contracting the lower abdominals as you do that. Keep your upper body relaxed. Do 5-10 on each side.    Madelyn Flavors, PT 08/24/16 3:12 PM [facility] [place], [place], [address] Phone: [phone]   Fax:  [phone]

## 2016-09-01 ENCOUNTER — Other Ambulatory Visit: Payer: Self-pay | Admitting: Dermatology

## 2016-11-23 ENCOUNTER — Other Ambulatory Visit: Payer: Self-pay | Admitting: Family Medicine

## 2016-11-23 DIAGNOSIS — Z1231 Encounter for screening mammogram for malignant neoplasm of breast: Secondary | ICD-10-CM

## 2016-12-16 ENCOUNTER — Ambulatory Visit
Admission: RE | Admit: 2016-12-16 | Discharge: 2016-12-16 | Disposition: A | Discharge status: Home / Self Care | Payer: Medicare Other | Source: Ambulatory Visit | Attending: Family Medicine | Admitting: Family Medicine

## 2016-12-16 DIAGNOSIS — Z1231 Encounter for screening mammogram for malignant neoplasm of breast: Secondary | ICD-10-CM

## 2017-01-26 ENCOUNTER — Encounter: Payer: Self-pay | Admitting: Neurology

## 2017-01-27 ENCOUNTER — Encounter: Payer: Self-pay | Admitting: Neurology

## 2017-01-27 ENCOUNTER — Ambulatory Visit (INDEPENDENT_AMBULATORY_CARE_PROVIDER_SITE_OTHER): Payer: Medicare Other | Admitting: Neurology

## 2017-01-27 VITALS — BP 125/89 | HR 64 | Ht 65.0 in | Wt 222.0 lb

## 2017-01-27 DIAGNOSIS — G4710 Hypersomnia, unspecified: Secondary | ICD-10-CM | POA: Diagnosis not present

## 2017-01-27 DIAGNOSIS — G4752 REM sleep behavior disorder: Secondary | ICD-10-CM | POA: Insufficient documentation

## 2017-01-27 DIAGNOSIS — G4730 Sleep apnea, unspecified: Secondary | ICD-10-CM | POA: Diagnosis not present

## 2017-01-27 DIAGNOSIS — G4719 Other hypersomnia: Secondary | ICD-10-CM

## 2017-01-27 DIAGNOSIS — R0683 Snoring: Secondary | ICD-10-CM | POA: Diagnosis not present

## 2017-01-27 DIAGNOSIS — R55 Syncope and collapse: Secondary | ICD-10-CM | POA: Diagnosis not present
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
## 2017-02-09 ENCOUNTER — Ambulatory Visit (INDEPENDENT_AMBULATORY_CARE_PROVIDER_SITE_OTHER): Payer: Medicare Other | Admitting: Neurology

## 2017-02-09 DIAGNOSIS — G4730 Sleep apnea, unspecified: Secondary | ICD-10-CM | POA: Diagnosis not present

## 2017-02-09 DIAGNOSIS — G4710 Hypersomnia, unspecified: Secondary | ICD-10-CM

## 2017-02-09 DIAGNOSIS — R55 Syncope and collapse: Secondary | ICD-10-CM

## 2017-02-09 DIAGNOSIS — G4719 Other hypersomnia: Secondary | ICD-10-CM

## 2017-02-09 DIAGNOSIS — G4752 REM sleep behavior disorder: Secondary | ICD-10-CM

## 2017-02-09 DIAGNOSIS — R0683 Snoring: Secondary | ICD-10-CM

## 2017-02-22 NOTE — Procedures (Signed)
PATIENT'S NAME:  Kaitlin Watkins, Kaitlin Watkins DOB:      05/05/1946      MR#:    [id number]     DATE OF RECORDING: 02/09/2017 REFERRING M.D.:  [name], [name]  PCP is Ival Bible, MD Study Performed:  Baseline Polysomnogram HISTORY:  Mr. Haigh dentist has done a HST with her, AHI was 24/hr. loud snoring, SpO2 nadir was 74%, oxygen desaturation index was 36.9/hr.  Her symptoms indicate the presence of sleep apnea, supine dependency of sleep apnea, and to have REM sleep behavior. Long naps do not refresh her.  She has been snoring, kicking and yelling in her sleep. She has Ankle edema.  She reports excessive daytime sleepiness and a very high degree of daytime fatigue. Medical diagnosis include non cardiac- syncope, hypercholesterolemia, EDS, obesity, postmenopausal status.  The patient endorsed the Epworth Sleepiness Scale at 18/24 points.  FSS at 63 points.  The patient's weight 223 pounds with a height of 65 (inches), resulting in a BMI of 37.1 kg/m2. The patient's neck circumference measured 16 inches.  CURRENT MEDICATIONS: Tylenol, Proventil, Aspirin, Lipitor, Estrace, Zetia, Famvir, Lasix, Neurontin, Norco, Xiidra, Cozaar, Singulair, Pazeo, Percocet, Tobradex eye drops.   PROCEDURE:  This is a multichannel digital polysomnogram utilizing the SomnoStar 11.2 system.  Electrodes and sensors were applied and monitored per AASM Specifications.   EEG, EOG, Chin and Limb EMG, were sampled at 200 Hz.  ECG, Snore and Nasal Pressure, Thermal Airflow, Respiratory Effort, CPAP Flow and Pressure, Oximetry was sampled at 50 Hz. Digital video and audio were recorded.      BASELINE STUDY  Lights Out was at 22:30 and Lights On at 04:45.  Total recording time (TRT) was 375.5 minutes, with a total sleep time (TST) of 339.5 minutes.   The patient's sleep latency was 7.5 minutes.  REM latency was 67.5 minutes.  The sleep efficiency was 90.4 %.     SLEEP ARCHITECTURE: WASO (Wake after sleep onset) was 11.5 minutes.   There were 10 minutes in Stage N1, 81 minutes Stage N2, 177 minutes Stage N3 and 71.5 minutes in Stage REM.  The percentage of Stage N1 was 2.9%, Stage N2 was 23.9%, Stage N3 was 52.1% and Stage R (REM sleep) was 21.1%.   RESPIRATORY ANALYSIS:  There were a total of 51 respiratory events:  6 obstructive apneas, 0 central apneas and 45 hypopneas with a hypopnea index of 8.0 /hour. The patient also had 0 respiratory event related arousals (RERAs).  The total APNEA/HYPOPNEA INDEX (AHI) was 9.0/hour and the total RESPIRATORY DISTURBANCE INDEX was 9.1 /hour.  38 events occurred in REM sleep and 26 events in NREM. The REM AHI was 31.9 /hour, versus a non-REM AHI of 2.9. The patient spent 233.5 minutes of total sleep time in the supine position and 106 minutes in non-supine. The supine AHI was 7.5 versus a non-supine AHI of 12.4.  OXYGEN SATURATION & C02:  The Wake baseline 02 saturation was 99%, with the lowest being 78%. Time spent below 89% saturation equaled 16 minutes.   PERIODIC LIMB MOVEMENTS:  The patient had a total of 5 Periodic Limb Movements.  The Periodic Limb Movement (PLM) index was 0.9 and the PLM Arousal index was 0.0/hour. The arousals were noted as: 8 were spontaneous, 0 were associated with PLMs, and 2 were associated with respiratory events.  Audio and video analysis did not show any abnormal or unusual movements, behaviors, phonations or vocalizations.  The patient took one bathroom break. Loud Snoring was noted. EKG  was in keeping with normal sinus rhythm (NSR).Post-study, the patient indicated that sleep was more fragmented than usual, due to joint pain , hip and ankle pain.    IMPRESSION:  1. Obstructive Sleep Apnea(OSA), mild at AHI 9.0 but REM accentuated to 31.9/ hr.  2. Loud Primary Snoring 3. Spontaneous arousals. Attributed to pain ( see patients comment)    RECOMMENDATIONS: REM dependent sleep apnea is usually treated with CPAP. I will ask the patient to return for a  titration.   1. Avoid sedative-hypnotics which may worsen sleep apnea, alcohol and tobacco (as applicable). 2. Advise to lose weight, diet and exercise if not contraindicated (BMI =37). 3. Advise patient to avoid driving or operating hazardous machinery when sleepy. 4. Further information regarding OSA may be obtained from [employer] (www.sleepfoundation.org) or American Sleep Apnea Association (www.sleepapnea.org). 5. Avoid caffeine-containing beverages and chocolate. 6. Consider dedicated sleep psychology referral if insomnia is of clinical concern.   7. A follow up appointment will be scheduled in the Sleep Clinic at [facility]. The referring provider will be notified of the results.      I certify that I have reviewed the entire raw data recording prior to the issuance of this report in accordance with the Standards of Accreditation of the American Academy of Sleep Medicine (AASM)    Larey Seat, MD  02-22-2017  Diplomat, American Board of Psychiatry and Neurology  Diplomat, American Board of [place] Director, [place] [facility] [employer]

## 2017-02-22 NOTE — Addendum Note (Signed)
Addended by: Larey Seat on: 02/22/2017 11:05 AM   Modules accepted: Orders

## 2017-02-24 ENCOUNTER — Telehealth: Payer: Self-pay | Admitting: Neurology

## 2017-02-24 NOTE — Telephone Encounter (Signed)
I called pt. I advised pt that Dr. Brett Fairy reviewed their sleep study results and found that pt has OSA. Dr. Brett Fairy recommends that pt come in for a CPAP titration study so that we can determine what setting works best for her.Pt verbalized understanding of results. Pt had no questions at this time but was encouraged to call back if questions arise.

## 2017-02-24 NOTE — Telephone Encounter (Signed)
-----   Message from Larey Seat, MD sent at 02/22/2017 11:05 AM EDT ----- Mild OSA- withou prolonged oxygen desaturation, but REM accentuated. This form of apnea is treated best with CPAP, and this should help her high degree of sleepiness.  No PLMs in REM sleep , but significant snoring .   Cc Elza Rafter

## 2017-03-14 ENCOUNTER — Ambulatory Visit (INDEPENDENT_AMBULATORY_CARE_PROVIDER_SITE_OTHER): Payer: Medicare Other | Admitting: Neurology

## 2017-03-14 DIAGNOSIS — G4710 Hypersomnia, unspecified: Secondary | ICD-10-CM

## 2017-03-14 DIAGNOSIS — R55 Syncope and collapse: Secondary | ICD-10-CM

## 2017-03-14 DIAGNOSIS — R0683 Snoring: Secondary | ICD-10-CM

## 2017-03-14 DIAGNOSIS — G4752 REM sleep behavior disorder: Secondary | ICD-10-CM

## 2017-03-14 DIAGNOSIS — G4733 Obstructive sleep apnea (adult) (pediatric): Secondary | ICD-10-CM

## 2017-03-14 DIAGNOSIS — G4719 Other hypersomnia: Secondary | ICD-10-CM

## 2017-03-14 DIAGNOSIS — G4730 Sleep apnea, unspecified: Principal | ICD-10-CM

## 2017-03-16 NOTE — Addendum Note (Signed)
Addended by: Larey Seat on: 03/16/2017 05:14 PM   Modules accepted: Orders

## 2017-03-16 NOTE — Procedures (Signed)
PATIENT'S NAME:  Kaitlin Watkins, Thier DOB:      [birth year]      MR#:    [id number]     DATE OF RECORDING: 03/14/2017 REFERRING:  [name], [name] Study Performed:   CPAP titration HISTORY:  Kaitlin Watkins dentist, DRr. Morris, had performed a HST with her. The AHI was 24/hr. and associated with loud snoring, her SpO2 nadir was 74%, and oxygen desaturation index was 36.9/hr. We repeated an in lab sleep study on 02-09-17: The total APNEA/HYPOPNEA INDEX (AHI) was 9.0/hour. The REM AHI was 31.9 /hour, versus a non-REM AHI of 2.9. The patient spent 233.5 minutes of total sleep time in the supine position and 106 minutes in non-supine. The supine AHI was 7.5 versus a non-supine AHI of 12.4.   OXYGEN SATURATION & C02:  The Wake baseline 02 saturation was 99%, with the lowest being 78%. Time spent below 89% saturation equaled16 minutes  1. Obstructive Sleep Apnea (OSA), mild at AHI 9.0 but REM accentuated to 31.9/ hr.  2. Loud Primary Snoring 3. Spontaneous arousals. Attributed to pain (see patients comment).   PROCEDURE:  This is a multichannel digital polysomnogram utilizing the SomnoStar 11.2 system.  Electrodes and sensors were applied and monitored per AASM Specifications.   EEG, EOG, Chin and Limb EMG, were sampled at 200 Hz.  ECG, Snore and Nasal Pressure, Thermal Airflow, Respiratory Effort, CPAP Flow and Pressure, Oximetry was sampled at 50 Hz. Digital video and audio were recorded.        CURRENT MEDICATIONS: Tylenol, Proventil, Aspirin, Lipitor, Estrace, Zetia, Famvir, Lasix, Neurontin, Norco, Xiidra, Cozaar, Singulair, Pazeo, Percocet, Tobradex  CPAP was initiated at 5.0 cmH20 with heated humidity per AASM split night standards and pressure was advanced to 10 cmH20 because of hypopneas, apneas and desaturations.  At a PAP pressure of 10 cmH20, there was a reduction of the AHI to 1.0 with improvement of obstructive sleep apnea, Nadir SpO2 at 89% and with 98% sleep efficiency.    Lights Out was at  22:05 and Lights On at 05:02. Total recording time (TRT) was 417.5 minutes, with a total sleep time (TST) of 349.5 minutes. The patient's sleep latency was 43.5 minutes with 0 minutes of wake time after sleep onset. REM latency was 128.5 minutes.  The sleep efficiency was 83.7 %.    SLEEP ARCHITECTURE: WASO (Wake after sleep onset) was 51.5 minutes.  There were 30 minutes in Stage N1, 150.5 minutes Stage N2, 79.5 minutes Stage N3 and 89.5 minutes in Stage REM.  The percentage of Stage N1 was 8.6%, Stage N2 was 43.1%, Stage N3 was 22.7% and Stage R (REM sleep) was 25.6%.   RESPIRATORY ANALYSIS:  There was a total of 29 respiratory events: 0 apneas and 29 hypopneas with 0 respiratory event related arousals (RERAs).    The total APNEA/HYPOPNEA INDEX (AHI) was 5.0 /hour and the total RESPIRATORY DISTURBANCE INDEX was 5.0/ hr.  9 events occurred in REM sleep and 20 events in NREM. The REM AHI was 6.0 /hour versus a non-REM AHI of 4.6 /hour.  The patient spent 280.5 minutes of total sleep time in the supine position and 69 minutes in non-supine. The supine AHI was 5.1, versus a non-supine AHI of 4.3.  OXYGEN SATURATION & C02:  The baseline 02 saturation was 96%, with the lowest being 82%. Time spent below 89% saturation equaled 5 minutes.  PERIODIC LIMB MOVEMENTS:    The patient had a total of 0 Periodic Limb Movements. The arousals were noted as:  24 were spontaneous, 0 were associated with PLMs, and 7 were associated with respiratory events.  Audio and video analysis did not show any abnormal or unusual movements, behaviors, phonations or vocalizations.   No nocturia, Snoring was noted. EKG was in keeping with normal sinus rhythm (NSR).  DIAGNOSIS   Mild Sleep Apnea (OSA) responding well to CPAP at 10 cm water with 1 cm EPR.  The patient was fitted with a ResMed P10 nasal pillow, pink strap in small size.  PLANS/RECOMMENDATIONS: Start CPAP at 10 cm water with 1 cm EPR, but use auto-titration capable  device. Heated humidity, compliance follow up in 60-90 days with yearly visits after that.  Should the patient not be able to tolerate CPAP, will pursue dental device as plan B.   A follow up appointment will be scheduled in the Sleep Clinic at [facility].   Please call [phone] with any questions.      I certify that I have reviewed the entire raw data recording prior to the issuance of this report in accordance with the Standards of Accreditation of the American Academy of Sleep Medicine (AASM)      Larey Seat, M.D.    03-16-2017 Diplomat, American Board of Psychiatry and Neurology  Diplomat, [place] of Sleep Medicine Medical Director, [place] [facility] at [facility]

## 2017-03-17 ENCOUNTER — Telehealth: Payer: Self-pay | Admitting: Neurology

## 2017-03-17 NOTE — Telephone Encounter (Signed)
I called pt. I advised pt that Dr. Brett Fairy reviewed their sleep study results and found that pt does have mild OSA. Dr. Brett Fairy recommends that pt starts CPAP. I reviewed PAP compliance expectations with the pt. Pt is agreeable to starting a CPAP. I advised pt that an order will be sent to a DME, [facility], and [facility] will call the pt within about one week after they file with the pt's insurance. [facility] will show the pt how to use the machine, fit for masks, and troubleshoot the CPAP if needed. A follow up appt was made for insurance purposes with Cecille Rubin, NP on Jun 16 2017 at 10:15. Pt verbalized understanding to arrive 15 minutes early and bring their CPAP. A letter with all of this information in it will be mailed to the pt as a reminder. I verified with the pt that the address we have on file is correct. Pt verbalized understanding of results. Pt had no questions at this time but was encouraged to call back if questions arise.

## 2017-03-17 NOTE — Telephone Encounter (Signed)
-----   Message from Larey Seat, MD sent at 03/16/2017  5:14 PM EDT ----- Overall mild sleep apnea with REM accentuation had been diagnosed on 02-09-2017, now titrated to 10 cm CPAP pressure with 1 cm EPR and using a RES MED P 10 small nasal pillow. I ordered the CPAP machine and mask for her. RV in 30-90 days for compliance. If she cannot use CPAP for any reason, will need to consider dental alternatives.    Myriam Jacobson, please forward to DR Dion Saucier,  Dr [name], [name]

## 2017-06-14 ENCOUNTER — Encounter: Payer: Self-pay | Admitting: Neurology
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
## 2017-06-16 ENCOUNTER — Encounter: Payer: Self-pay | Admitting: Nurse Practitioner

## 2017-06-16 ENCOUNTER — Ambulatory Visit (INDEPENDENT_AMBULATORY_CARE_PROVIDER_SITE_OTHER): Payer: Medicare Other | Admitting: Nurse Practitioner

## 2017-06-16 DIAGNOSIS — G4733 Obstructive sleep apnea (adult) (pediatric): Secondary | ICD-10-CM | POA: Diagnosis not present

## 2017-06-16 DIAGNOSIS — Z9989 Dependence on other enabling machines and devices: Secondary | ICD-10-CM | POA: Diagnosis not present

## 2017-06-17 NOTE — Progress Notes (Signed)
I have reviewed and agreed above plan. 

## 2017-12-13 ENCOUNTER — Encounter: Payer: Self-pay | Admitting: Nurse Practitioner
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
## 2017-12-15 ENCOUNTER — Encounter: Payer: Self-pay | Admitting: Nurse Practitioner

## 2017-12-15 ENCOUNTER — Ambulatory Visit (INDEPENDENT_AMBULATORY_CARE_PROVIDER_SITE_OTHER): Payer: Medicare Other | Admitting: Nurse Practitioner

## 2017-12-15 VITALS — BP 135/85 | HR 61 | Ht 65.0 in | Wt 220.0 lb

## 2017-12-15 DIAGNOSIS — Z9989 Dependence on other enabling machines and devices: Secondary | ICD-10-CM | POA: Diagnosis not present

## 2017-12-15 DIAGNOSIS — G4733 Obstructive sleep apnea (adult) (pediatric): Secondary | ICD-10-CM | POA: Diagnosis not present

## 2017-12-15 NOTE — Patient Instructions (Signed)
CPAP compliance 70% for 3 months of data Continue same settings Suggest she get mask refitting at [facility]  Address [address] Follow-up in 6 months for repeat compliance

## 2017-12-23 NOTE — Progress Notes (Signed)
I agree with the assessment and plan as directed by NP .The patient is known to me .   Latanya Hemmer, MD  

## 2018-01-06 ENCOUNTER — Other Ambulatory Visit: Payer: Self-pay | Admitting: Family Medicine

## 2018-01-06 DIAGNOSIS — Z1231 Encounter for screening mammogram for malignant neoplasm of breast: Secondary | ICD-10-CM

## 2018-01-26 ENCOUNTER — Ambulatory Visit
Admission: RE | Admit: 2018-01-26 | Discharge: 2018-01-26 | Disposition: A | Discharge status: Home / Self Care | Payer: Medicare Other | Source: Ambulatory Visit | Attending: Family Medicine | Admitting: Family Medicine

## 2018-01-26 DIAGNOSIS — Z1231 Encounter for screening mammogram for malignant neoplasm of breast: Secondary | ICD-10-CM

## 2018-02-24 ENCOUNTER — Telehealth: Payer: Self-pay | Admitting: Nurse Practitioner

## 2018-02-24 NOTE — Telephone Encounter (Signed)
I spoke to [employer] at [employer].  She stated that when pt arrives back home and plugs cpap back in here where there is good wifi it should be uploaded.  I relayed to pts husband that I spoke to Perry with [facility] about problem and what she stated would happen when pt returns.  He will relay to wife.  I also recommended to call [facility] and let them know as well so be on record.  He said he will do so.  Wife will call us back when she returns.

## 2018-02-24 NOTE — Telephone Encounter (Signed)
Pt's husband Dennis/DPR said he rec'd a call yesterday to pick up the pt's cpap. Husband said she is in [place] is using nightly. He said the wi-fi is sketchy and is probably not picking it up. He said their daughter fell and the pt has been there for the past 3 weeks and will probably be there for another 2-3 weeks. He said she is very upset bc she has been compliant. Please call to advise on how this should be handled. He is aware the clinic closes at noon today.

## 2018-07-18 ENCOUNTER — Ambulatory Visit: Payer: Medicare Other | Admitting: Nurse Practitioner

## 2018-09-02 ENCOUNTER — Encounter: Payer: Self-pay | Admitting: Gastroenterology

## 2021-07-08 DIAGNOSIS — J019 Acute sinusitis, unspecified: Secondary | ICD-10-CM | POA: Diagnosis not present

## 2021-07-17 DIAGNOSIS — J0141 Acute recurrent pansinusitis: Secondary | ICD-10-CM | POA: Diagnosis not present

## 2021-07-17 DIAGNOSIS — J209 Acute bronchitis, unspecified: Secondary | ICD-10-CM | POA: Diagnosis not present

## 2021-09-14 DIAGNOSIS — R051 Acute cough: Secondary | ICD-10-CM | POA: Diagnosis not present

## 2021-09-14 DIAGNOSIS — R062 Wheezing: Secondary | ICD-10-CM | POA: Diagnosis not present

## 2021-09-14 DIAGNOSIS — J069 Acute upper respiratory infection, unspecified: Secondary | ICD-10-CM | POA: Diagnosis not present

## 2021-09-20 DIAGNOSIS — J209 Acute bronchitis, unspecified: Secondary | ICD-10-CM | POA: Diagnosis not present

## 2021-11-05 DIAGNOSIS — J019 Acute sinusitis, unspecified: Secondary | ICD-10-CM | POA: Diagnosis not present

## 2021-11-05 DIAGNOSIS — A493 Mycoplasma infection, unspecified site: Secondary | ICD-10-CM | POA: Diagnosis not present

## 2021-11-05 DIAGNOSIS — R051 Acute cough: Secondary | ICD-10-CM | POA: Diagnosis not present

## 2021-11-30 DIAGNOSIS — J0100 Acute maxillary sinusitis, unspecified: Secondary | ICD-10-CM | POA: Diagnosis not present

## 2022-07-05 DIAGNOSIS — J189 Pneumonia, unspecified organism: Secondary | ICD-10-CM

## 2022-07-05 HISTORY — DX: Pneumonia, unspecified organism: J18.9

## 2022-07-22 DIAGNOSIS — R051 Acute cough: Secondary | ICD-10-CM | POA: Diagnosis not present

## 2022-07-22 DIAGNOSIS — J0100 Acute maxillary sinusitis, unspecified: Secondary | ICD-10-CM | POA: Diagnosis not present

## 2022-12-13 ENCOUNTER — Encounter (HOSPITAL_COMMUNITY): Payer: Self-pay

## 2022-12-13 ENCOUNTER — Other Ambulatory Visit: Payer: Self-pay

## 2022-12-13 ENCOUNTER — Inpatient Hospital Stay (HOSPITAL_COMMUNITY)
Admission: EM | Admit: 2022-12-13 | Discharge: 2022-12-20 | DRG: 193 | Disposition: A | Discharge status: Home Health | Payer: Medicare Other | Attending: Family Medicine | Admitting: Family Medicine

## 2022-12-13 ENCOUNTER — Emergency Department (HOSPITAL_COMMUNITY): Payer: Medicare Other

## 2022-12-13 DIAGNOSIS — I5031 Acute diastolic (congestive) heart failure: Secondary | ICD-10-CM | POA: Diagnosis not present

## 2022-12-13 DIAGNOSIS — J9809 Other diseases of bronchus, not elsewhere classified: Secondary | ICD-10-CM | POA: Diagnosis not present

## 2022-12-13 DIAGNOSIS — Z803 Family history of malignant neoplasm of breast: Secondary | ICD-10-CM | POA: Diagnosis not present

## 2022-12-13 DIAGNOSIS — R509 Fever, unspecified: Secondary | ICD-10-CM | POA: Diagnosis not present

## 2022-12-13 DIAGNOSIS — Z87891 Personal history of nicotine dependence: Secondary | ICD-10-CM

## 2022-12-13 DIAGNOSIS — I110 Hypertensive heart disease with heart failure: Secondary | ICD-10-CM | POA: Diagnosis not present

## 2022-12-13 DIAGNOSIS — Z887 Allergy status to serum and vaccine status: Secondary | ICD-10-CM | POA: Diagnosis not present

## 2022-12-13 DIAGNOSIS — I509 Heart failure, unspecified: Secondary | ICD-10-CM

## 2022-12-13 DIAGNOSIS — I5033 Acute on chronic diastolic (congestive) heart failure: Secondary | ICD-10-CM | POA: Diagnosis present

## 2022-12-13 DIAGNOSIS — J180 Bronchopneumonia, unspecified organism: Principal | ICD-10-CM | POA: Diagnosis present

## 2022-12-13 DIAGNOSIS — Z1152 Encounter for screening for COVID-19: Secondary | ICD-10-CM | POA: Diagnosis not present

## 2022-12-13 DIAGNOSIS — R062 Wheezing: Secondary | ICD-10-CM | POA: Diagnosis not present

## 2022-12-13 DIAGNOSIS — Z79899 Other long term (current) drug therapy: Secondary | ICD-10-CM

## 2022-12-13 DIAGNOSIS — J440 Chronic obstructive pulmonary disease with acute lower respiratory infection: Secondary | ICD-10-CM | POA: Diagnosis not present

## 2022-12-13 DIAGNOSIS — E785 Hyperlipidemia, unspecified: Secondary | ICD-10-CM | POA: Diagnosis not present

## 2022-12-13 DIAGNOSIS — Z8249 Family history of ischemic heart disease and other diseases of the circulatory system: Secondary | ICD-10-CM | POA: Diagnosis not present

## 2022-12-13 DIAGNOSIS — Z96652 Presence of left artificial knee joint: Secondary | ICD-10-CM | POA: Diagnosis not present

## 2022-12-13 DIAGNOSIS — I5021 Acute systolic (congestive) heart failure: Secondary | ICD-10-CM | POA: Diagnosis not present

## 2022-12-13 DIAGNOSIS — Z888 Allergy status to other drugs, medicaments and biological substances status: Secondary | ICD-10-CM | POA: Diagnosis not present

## 2022-12-13 DIAGNOSIS — I10 Essential (primary) hypertension: Secondary | ICD-10-CM

## 2022-12-13 DIAGNOSIS — Z7982 Long term (current) use of aspirin: Secondary | ICD-10-CM

## 2022-12-13 DIAGNOSIS — Z7722 Contact with and (suspected) exposure to environmental tobacco smoke (acute) (chronic): Secondary | ICD-10-CM | POA: Diagnosis present

## 2022-12-13 DIAGNOSIS — I272 Pulmonary hypertension, unspecified: Secondary | ICD-10-CM | POA: Diagnosis not present

## 2022-12-13 DIAGNOSIS — Z20822 Contact with and (suspected) exposure to covid-19: Secondary | ICD-10-CM | POA: Diagnosis not present

## 2022-12-13 DIAGNOSIS — Z85828 Personal history of other malignant neoplasm of skin: Secondary | ICD-10-CM

## 2022-12-13 DIAGNOSIS — R918 Other nonspecific abnormal finding of lung field: Secondary | ICD-10-CM

## 2022-12-13 DIAGNOSIS — J441 Chronic obstructive pulmonary disease with (acute) exacerbation: Secondary | ICD-10-CM | POA: Diagnosis not present

## 2022-12-13 DIAGNOSIS — Z823 Family history of stroke: Secondary | ICD-10-CM | POA: Diagnosis not present

## 2022-12-13 DIAGNOSIS — Z86718 Personal history of other venous thrombosis and embolism: Secondary | ICD-10-CM | POA: Diagnosis not present

## 2022-12-13 DIAGNOSIS — G4733 Obstructive sleep apnea (adult) (pediatric): Secondary | ICD-10-CM | POA: Diagnosis present

## 2022-12-13 DIAGNOSIS — Z91012 Allergy to eggs: Secondary | ICD-10-CM | POA: Diagnosis not present

## 2022-12-13 DIAGNOSIS — K219 Gastro-esophageal reflux disease without esophagitis: Secondary | ICD-10-CM | POA: Diagnosis present

## 2022-12-13 DIAGNOSIS — J189 Pneumonia, unspecified organism: Principal | ICD-10-CM

## 2022-12-13 DIAGNOSIS — R0602 Shortness of breath: Secondary | ICD-10-CM | POA: Diagnosis not present

## 2022-12-13 DIAGNOSIS — R059 Cough, unspecified: Secondary | ICD-10-CM | POA: Diagnosis not present

## 2022-12-13 NOTE — ED Triage Notes (Addendum)
Patient BIB POV from home with complaint of SOB & nasal drainage x several months. Patient reports today cough got worse.    Patient reports being seen at [facility] today and referred to come to ER for evaluation. Patient had fever at [facility] & received tylenol & breathing treatment.

## 2022-12-14 ENCOUNTER — Inpatient Hospital Stay (HOSPITAL_COMMUNITY): Payer: Medicare Other

## 2022-12-14 ENCOUNTER — Emergency Department (HOSPITAL_COMMUNITY): Payer: Medicare Other

## 2022-12-14 ENCOUNTER — Encounter (HOSPITAL_COMMUNITY): Payer: Self-pay | Admitting: Internal Medicine

## 2022-12-14 DIAGNOSIS — K219 Gastro-esophageal reflux disease without esophagitis: Secondary | ICD-10-CM | POA: Diagnosis present

## 2022-12-14 DIAGNOSIS — I272 Pulmonary hypertension, unspecified: Secondary | ICD-10-CM | POA: Diagnosis present

## 2022-12-14 DIAGNOSIS — G4733 Obstructive sleep apnea (adult) (pediatric): Secondary | ICD-10-CM | POA: Diagnosis present

## 2022-12-14 DIAGNOSIS — Z86718 Personal history of other venous thrombosis and embolism: Secondary | ICD-10-CM | POA: Diagnosis not present

## 2022-12-14 DIAGNOSIS — Z1152 Encounter for screening for COVID-19: Secondary | ICD-10-CM | POA: Diagnosis not present

## 2022-12-14 DIAGNOSIS — J189 Pneumonia, unspecified organism: Secondary | ICD-10-CM | POA: Diagnosis not present

## 2022-12-14 DIAGNOSIS — Z96652 Presence of left artificial knee joint: Secondary | ICD-10-CM | POA: Diagnosis present

## 2022-12-14 DIAGNOSIS — Z8249 Family history of ischemic heart disease and other diseases of the circulatory system: Secondary | ICD-10-CM | POA: Diagnosis not present

## 2022-12-14 DIAGNOSIS — Z87891 Personal history of nicotine dependence: Secondary | ICD-10-CM | POA: Diagnosis not present

## 2022-12-14 DIAGNOSIS — Z91012 Allergy to eggs: Secondary | ICD-10-CM | POA: Diagnosis not present

## 2022-12-14 DIAGNOSIS — Z888 Allergy status to other drugs, medicaments and biological substances status: Secondary | ICD-10-CM | POA: Diagnosis not present

## 2022-12-14 DIAGNOSIS — Z887 Allergy status to serum and vaccine status: Secondary | ICD-10-CM | POA: Diagnosis not present

## 2022-12-14 DIAGNOSIS — Z79899 Other long term (current) drug therapy: Secondary | ICD-10-CM | POA: Diagnosis not present

## 2022-12-14 DIAGNOSIS — I509 Heart failure, unspecified: Secondary | ICD-10-CM

## 2022-12-14 DIAGNOSIS — I5033 Acute on chronic diastolic (congestive) heart failure: Secondary | ICD-10-CM

## 2022-12-14 DIAGNOSIS — E785 Hyperlipidemia, unspecified: Secondary | ICD-10-CM | POA: Diagnosis present

## 2022-12-14 DIAGNOSIS — J180 Bronchopneumonia, unspecified organism: Secondary | ICD-10-CM | POA: Diagnosis present

## 2022-12-14 DIAGNOSIS — Z7722 Contact with and (suspected) exposure to environmental tobacco smoke (acute) (chronic): Secondary | ICD-10-CM | POA: Diagnosis present

## 2022-12-14 DIAGNOSIS — J441 Chronic obstructive pulmonary disease with (acute) exacerbation: Secondary | ICD-10-CM | POA: Diagnosis present

## 2022-12-14 DIAGNOSIS — J440 Chronic obstructive pulmonary disease with acute lower respiratory infection: Secondary | ICD-10-CM | POA: Diagnosis present

## 2022-12-14 DIAGNOSIS — I5021 Acute systolic (congestive) heart failure: Secondary | ICD-10-CM

## 2022-12-14 DIAGNOSIS — Z7982 Long term (current) use of aspirin: Secondary | ICD-10-CM | POA: Diagnosis not present

## 2022-12-14 DIAGNOSIS — Z823 Family history of stroke: Secondary | ICD-10-CM | POA: Diagnosis not present

## 2022-12-14 DIAGNOSIS — I5031 Acute diastolic (congestive) heart failure: Secondary | ICD-10-CM | POA: Diagnosis not present

## 2022-12-14 DIAGNOSIS — I10 Essential (primary) hypertension: Secondary | ICD-10-CM | POA: Diagnosis not present

## 2022-12-14 DIAGNOSIS — Z85828 Personal history of other malignant neoplasm of skin: Secondary | ICD-10-CM | POA: Diagnosis not present

## 2022-12-14 DIAGNOSIS — Z803 Family history of malignant neoplasm of breast: Secondary | ICD-10-CM | POA: Diagnosis not present

## 2022-12-14 DIAGNOSIS — I110 Hypertensive heart disease with heart failure: Secondary | ICD-10-CM | POA: Diagnosis present

## 2022-12-14 LAB — CBC
HCT: 36 % (ref 36.0–46.0)
Hemoglobin: 11.4 g/dL — ABNORMAL LOW (ref 12.0–15.0)
MCH: 27.4 pg (ref 26.0–34.0)
MCHC: 31.7 g/dL (ref 30.0–36.0)
MCV: 86.5 fL (ref 80.0–100.0)
Platelets: 217 10*3/uL (ref 150–400)
RBC: 4.16 MIL/uL (ref 3.87–5.11)
RDW: 14.6 % (ref 11.5–15.5)
WBC: 8.7 10*3/uL (ref 4.0–10.5)
nRBC: 0 % (ref 0.0–0.2)

## 2022-12-14 LAB — CBC WITH DIFFERENTIAL/PLATELET
Abs Immature Granulocytes: 0.02 10*3/uL (ref 0.00–0.07)
Basophils Absolute: 0 10*3/uL (ref 0.0–0.1)
Basophils Relative: 0 %
Eosinophils Absolute: 0 10*3/uL (ref 0.0–0.5)
Eosinophils Relative: 0 %
HCT: 40.6 % (ref 36.0–46.0)
Hemoglobin: 13 g/dL (ref 12.0–15.0)
Immature Granulocytes: 0 %
Lymphocytes Relative: 15 %
Lymphs Abs: 1.4 10*3/uL (ref 0.7–4.0)
MCH: 28.4 pg (ref 26.0–34.0)
MCHC: 32 g/dL (ref 30.0–36.0)
MCV: 88.6 fL (ref 80.0–100.0)
Monocytes Absolute: 0.8 10*3/uL (ref 0.1–1.0)
Monocytes Relative: 9 %
Neutro Abs: 6.5 10*3/uL (ref 1.7–7.7)
Neutrophils Relative %: 76 %
Platelets: 234 10*3/uL (ref 150–400)
RBC: 4.58 MIL/uL (ref 3.87–5.11)
RDW: 14.6 % (ref 11.5–15.5)
WBC: 8.8 10*3/uL (ref 4.0–10.5)
nRBC: 0 % (ref 0.0–0.2)

## 2022-12-14 LAB — I-STAT CHEM 8, ED
BUN: 15 mg/dL (ref 8–23)
Calcium, Ion: 1.1 mmol/L — ABNORMAL LOW (ref 1.15–1.40)
Chloride: 104 mmol/L (ref 98–111)
Creatinine, Ser: 0.9 mg/dL (ref 0.44–1.00)
Glucose, Bld: 108 mg/dL — ABNORMAL HIGH (ref 70–99)
HCT: 36 % (ref 36.0–46.0)
Hemoglobin: 12.2 g/dL (ref 12.0–15.0)
Potassium: 3.7 mmol/L (ref 3.5–5.1)
Sodium: 141 mmol/L (ref 135–145)
TCO2: 27 mmol/L (ref 22–32)

## 2022-12-14 LAB — BRAIN NATRIURETIC PEPTIDE: B Natriuretic Peptide: 1569.5 pg/mL — ABNORMAL HIGH (ref 0.0–100.0)

## 2022-12-14 LAB — COMPREHENSIVE METABOLIC PANEL
ALT: 13 U/L (ref 0–44)
AST: 22 U/L (ref 15–41)
Albumin: 3.2 g/dL — ABNORMAL LOW (ref 3.5–5.0)
Alkaline Phosphatase: 78 U/L (ref 38–126)
Anion gap: 13 (ref 5–15)
BUN: 14 mg/dL (ref 8–23)
CO2: 26 mmol/L (ref 22–32)
Calcium: 9 mg/dL (ref 8.9–10.3)
Chloride: 101 mmol/L (ref 98–111)
Creatinine, Ser: 0.93 mg/dL (ref 0.44–1.00)
GFR, Estimated: >60 mL/min (ref >60)
Glucose, Bld: 117 mg/dL — ABNORMAL HIGH (ref 70–99)
Potassium: 3.7 mmol/L (ref 3.5–5.1)
Sodium: 140 mmol/L (ref 135–145)
Total Bilirubin: 0.8 mg/dL (ref 0.3–1.2)
Total Protein: 6.8 g/dL (ref 6.5–8.1)

## 2022-12-14 LAB — MAGNESIUM: Magnesium: 2.1 mg/dL (ref 1.7–2.4)

## 2022-12-14 LAB — TROPONIN I (HIGH SENSITIVITY)
Troponin I (High Sensitivity): 10 ng/L (ref <18)
Troponin I (High Sensitivity): 9 ng/L (ref <18)

## 2022-12-14 LAB — GROUP A STREP BY PCR: Group A Strep by PCR: NOT DETECTED

## 2022-12-14 LAB — ECHOCARDIOGRAM COMPLETE
Area-P 1/2: 3.91 cm2
Height: 65 in
S' Lateral: 3.2 cm
Weight: 2800 oz

## 2022-12-14 LAB — RESP PANEL BY RT-PCR (RSV, FLU A&B, COVID)  RVPGX2
Influenza A by PCR: NEGATIVE
Influenza B by PCR: NEGATIVE
Resp Syncytial Virus by PCR: NEGATIVE
SARS Coronavirus 2 by RT PCR: NEGATIVE

## 2022-12-14 LAB — CREATININE, SERUM
Creatinine, Ser: 0.78 mg/dL (ref 0.44–1.00)
GFR, Estimated: >60 mL/min (ref >60)

## 2022-12-14 LAB — PROCALCITONIN: Procalcitonin: <0.1 ng/mL

## 2022-12-14 MED ORDER — CARVEDILOL 3.125 MG PO TABS
3.1250 mg | ORAL_TABLET | Freq: Two times a day (BID) | ORAL | Status: DC
  Administered 2022-12-14 – 2022-12-17 (×6): 3.125 mg via ORAL
  Filled 2022-12-14 (×6): qty 1

## 2022-12-14 MED ORDER — LOSARTAN POTASSIUM 50 MG PO TABS
50.0000 mg | ORAL_TABLET | Freq: Every day | ORAL | Status: DC
  Administered 2022-12-14 – 2022-12-15 (×2): 50 mg via ORAL
  Filled 2022-12-14 (×2): qty 1

## 2022-12-14 MED ORDER — IOHEXOL 350 MG/ML SOLN
75.0000 mL | Freq: Once | INTRAVENOUS | Status: AC | PRN
  Administered 2022-12-14: 75 mL via INTRAVENOUS

## 2022-12-14 MED ORDER — SODIUM CHLORIDE 0.9% FLUSH
3.0000 mL | INTRAVENOUS | Status: DC | PRN
  Administered 2022-12-19: 3 mL via INTRAVENOUS

## 2022-12-14 MED ORDER — SODIUM CHLORIDE 0.9 % IV SOLN
1.0000 g | Freq: Once | INTRAVENOUS | Status: AC
  Administered 2022-12-14: 1 g via INTRAVENOUS
  Filled 2022-12-14: qty 10

## 2022-12-14 MED ORDER — PANTOPRAZOLE SODIUM 40 MG PO TBEC
40.0000 mg | DELAYED_RELEASE_TABLET | Freq: Two times a day (BID) | ORAL | Status: DC
  Administered 2022-12-14 – 2022-12-20 (×13): 40 mg via ORAL
  Filled 2022-12-14 (×13): qty 1

## 2022-12-14 MED ORDER — LACTATED RINGERS IV BOLUS
500.0000 mL | Freq: Once | INTRAVENOUS | Status: AC
  Administered 2022-12-14: 500 mL via INTRAVENOUS

## 2022-12-14 MED ORDER — SODIUM CHLORIDE 0.9 % IV SOLN: 1.0000 g | INTRAVENOUS | Status: DC

## 2022-12-14 MED ORDER — ASPIRIN 81 MG PO TBEC
81.0000 mg | DELAYED_RELEASE_TABLET | Freq: Every day | ORAL | Status: DC
  Administered 2022-12-14 – 2022-12-20 (×7): 81 mg via ORAL
  Filled 2022-12-14 (×7): qty 1

## 2022-12-14 MED ORDER — SODIUM CHLORIDE 0.9 % IV SOLN: 250.0000 mL | INTRAVENOUS | Status: DC | PRN

## 2022-12-14 MED ORDER — MELATONIN 3 MG PO TABS
3.0000 mg | ORAL_TABLET | Freq: Every evening | ORAL | Status: DC | PRN
  Filled 2022-12-14: qty 1

## 2022-12-14 MED ORDER — FUROSEMIDE 10 MG/ML IJ SOLN: 20.0000 mg | Freq: Once | INTRAMUSCULAR | Status: DC

## 2022-12-14 MED ORDER — SODIUM CHLORIDE 0.9% FLUSH
3.0000 mL | Freq: Two times a day (BID) | INTRAVENOUS | Status: DC
  Administered 2022-12-14 – 2022-12-20 (×12): 3 mL via INTRAVENOUS

## 2022-12-14 MED ORDER — SODIUM CHLORIDE 0.9 % IV SOLN
500.0000 mg | Freq: Once | INTRAVENOUS | Status: AC
  Administered 2022-12-14: 500 mg via INTRAVENOUS
  Filled 2022-12-14: qty 5

## 2022-12-14 MED ORDER — KETOROLAC TROMETHAMINE 15 MG/ML IJ SOLN
15.0000 mg | Freq: Four times a day (QID) | INTRAMUSCULAR | Status: DC | PRN
  Administered 2022-12-14: 15 mg via INTRAVENOUS
  Filled 2022-12-14: qty 1

## 2022-12-14 MED ORDER — SENNA 8.6 MG PO TABS
1.0000 | ORAL_TABLET | Freq: Two times a day (BID) | ORAL | Status: DC
  Administered 2022-12-14 – 2022-12-20 (×12): 8.6 mg via ORAL
  Filled 2022-12-14 (×12): qty 1

## 2022-12-14 MED ORDER — FUROSEMIDE 10 MG/ML IJ SOLN
40.0000 mg | Freq: Three times a day (TID) | INTRAMUSCULAR | Status: AC
  Administered 2022-12-14 – 2022-12-15 (×3): 40 mg via INTRAVENOUS
  Filled 2022-12-14 (×3): qty 4

## 2022-12-14 MED ORDER — TRAZODONE HCL 50 MG PO TABS
25.0000 mg | ORAL_TABLET | Freq: Every evening | ORAL | Status: DC | PRN
  Filled 2022-12-14 (×2): qty 1

## 2022-12-14 MED ORDER — ENOXAPARIN SODIUM 40 MG/0.4ML IJ SOSY
40.0000 mg | PREFILLED_SYRINGE | INTRAMUSCULAR | Status: DC
  Administered 2022-12-14 – 2022-12-20 (×7): 40 mg via SUBCUTANEOUS
  Filled 2022-12-14 (×7): qty 0.4

## 2022-12-14 MED ORDER — FUROSEMIDE 10 MG/ML IJ SOLN
20.0000 mg | Freq: Once | INTRAMUSCULAR | Status: AC
  Administered 2022-12-14: 20 mg via INTRAVENOUS
  Filled 2022-12-14: qty 2

## 2022-12-14 MED ORDER — ACETAMINOPHEN 325 MG PO TABS
650.0000 mg | ORAL_TABLET | Freq: Four times a day (QID) | ORAL | Status: DC | PRN
  Administered 2022-12-14 – 2022-12-20 (×7): 650 mg via ORAL
  Filled 2022-12-14 (×7): qty 2

## 2022-12-14 MED ORDER — SODIUM CHLORIDE 0.9 % IV SOLN: 500.0000 mg | INTRAVENOUS | Status: DC

## 2022-12-14 MED ORDER — ATORVASTATIN CALCIUM 80 MG PO TABS
80.0000 mg | ORAL_TABLET | Freq: Every evening | ORAL | Status: DC
  Administered 2022-12-14 – 2022-12-19 (×6): 80 mg via ORAL
  Filled 2022-12-14 (×6): qty 1

## 2022-12-14 MED ORDER — GUAIFENESIN-CODEINE 100-10 MG/5ML PO SOLN
5.0000 mL | ORAL | Status: DC | PRN
  Administered 2022-12-14 – 2022-12-17 (×6): 5 mL via ORAL
  Filled 2022-12-14 (×6): qty 5

## 2022-12-14 MED ORDER — EZETIMIBE 10 MG PO TABS
10.0000 mg | ORAL_TABLET | Freq: Every day | ORAL | Status: DC
  Administered 2022-12-14 – 2022-12-20 (×7): 10 mg via ORAL
  Filled 2022-12-14 (×7): qty 1

## 2022-12-14 NOTE — Assessment & Plan Note (Addendum)
COPD exacerbation.   Imaging suggestive interstitial lung disease. At home with second hand smoke exposure. 02 saturation today 96% on 2 L/min per [place].   Plan to continue antibiotic therapy with ceftriaxone and azithromycin. Continue bronchodilator therapy and systemic corticosteroids.  Add inhaled steroids.  Continue with supplemental 02 per [place] and check ambulatory 02 screen, she may need home 02 at the time of her discharge.  Airway clearing techniques with flutter valve and incentive spirometer.   Will need outpatient PFT and high resolution CT scan.

## 2022-12-14 NOTE — Progress Notes (Signed)
  Echocardiogram 2D Echocardiogram has been performed.  Delcie Roch 12/14/2022, 2:32 PM

## 2022-12-14 NOTE — Assessment & Plan Note (Signed)
Patient not using CPAP at home.  Plan No CPAP at this time, will reassess after Echo
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
## 2022-12-14 NOTE — ED Notes (Signed)
Priscille Heidelberg (Granddaughter) called asking for a update for [place]. Her number is [phone].

## 2022-12-14 NOTE — Progress Notes (Signed)
Pt arrived from ED. AAOX4, VSS on 2L. Pt denies any pain or discomfort at this time. Ambulates with stand by assist. Congested cough, request cough meds, MD notified. Pt takes her pills whole, heart healthy diet. Skin intact. Pt educated on unit policies and safety precautions. Call bell within reach.

## 2022-12-14 NOTE — Assessment & Plan Note (Addendum)
Echocardiogram with preserved LV systolic function 60 to 65%, no LVH, RV with mild dilatation, RVSP 33.7 mmHg. RA and LA with no dilatation. No significant valvular disease.   Continue blood pressure monitoring. Continue losartan for blood pressure control. Hold on further diuretic therapy.

## 2022-12-14 NOTE — ED Notes (Signed)
Pt granddaughter updated on plan of care and pt notified that granddaughter would like to speak with pt.

## 2022-12-14 NOTE — Subjective & Objective (Addendum)
Ms. Grosch, a 77 y/o presents for cough and shortness of breath. Medical history includes GERD, HTN, OSA, HLD. She had prior DVT years ago following a surgery but is no longer on anticoagulation. Patient states that she has a chronic cough and shortness of breath since moving into her new Apt. 2 years ago. Symptoms worsened over the past week. She endorses fevers, rigors and a cough productive of clear frothy sputum with bitter taste. She endorsed DOE, decreased exercise tolerance over the past several weeks. . She does not smoke. She was seen at urgent care prior to arrival. While there, she was noted to have a fever of 103.2 degrees. X-ray urgent care showed concern of multifocal pneumonia. She was given Tylenol and nebulized breathing treatment. She was advised to come to the ED for further evaluation

## 2022-12-14 NOTE — ED Provider Notes (Signed)
[place] EMERGENCY DEPARTMENT AT [facility] Provider Note   CSN: [id number] Arrival date & time: 12/13/22  2253     History  Chief Complaint  Patient presents with   Shortness of Breath    Kaitlin Watkins is a 77 y.o. female.   Shortness of Breath Associated symptoms: cough and fever   Patient presents for cough and shortness of breath.  Medical history includes GERD, HTN, OSA, HLD.  She had prior DVT years ago following a surgery.  She is no longer on anticoagulation.  Patient states that she has a chronic cough and shortness of breath since moving into her new [address] 2 years ago.  Symptoms worsened over the past week.  Cough has been productive of clear sputum.  She does not smoke.  She was seen at urgent care prior to arrival.  While there, she was noted to have a fever of 103.2 degrees.  X-ray urgent care showed concern of multifocal pneumonia.  She was given Tylenol and nebulized breathing treatment.  She was advised to come to the ED for further evaluation.     Home Medications Prior to Admission medications   Medication Sig Start Date End Date Taking? Authorizing Provider  acetaminophen (TYLENOL) 325 MG tablet Take 2 tablets (650 mg total) by mouth every 6 (six) hours as needed for mild pain, moderate pain, fever or headache. 12/29/14   Jeralyn Bennett, MD  Ascorbic Acid (VITAMIN C) 1000 MG tablet Take 1,000 mg by mouth 2 (two) times daily.    [provider]  aspirin EC 81 MG tablet Take 81 mg by mouth daily.    [provider]  atorvastatin (LIPITOR) 80 MG tablet Take 80 mg by mouth every evening.    [provider]  Calcium Carbonate (CALCIUM 600 PO) Take 1 tablet by mouth 2 (two) times daily.    [provider]  cholecalciferol (VITAMIN D) 1000 UNITS tablet Take 1,000 Units by mouth 2 (two) times daily.    [provider]  ezetimibe (ZETIA) 10 MG tablet Take 10 mg by mouth. 06/16/16 12/15/17  [provider]   furosemide (LASIX) 20 MG tablet Take 40 mg by mouth as directed.     [provider]  glucosamine-chondroitin 500-400 MG tablet Take 1 tablet by mouth 2 (two) times daily.    [provider]  losartan (COZAAR) 50 MG tablet Take 1 tablet (50 mg total) by mouth daily. 12/18/13   Zanard, Hinton Dyer, MD  Melatonin 3 MG TABS Take 3 mg by mouth at bedtime as needed (sleep).     [provider]  montelukast (SINGULAIR) 10 MG tablet Take 10 mg by mouth. 09/21/16 09/21/17  [provider]  Multiple Vitamin (MULTI-VITAMINS) TABS Take by mouth.    [provider]  Omega-3 Fatty Acids (FISH OIL) 1200 MG CAPS Take 1 capsule by mouth 2 (two) times daily.    [provider]  Probiotic Product (PROBIOTIC PO) Take 1 tablet by mouth 2 (two) times daily.    [provider]  vitamin E 1000 UNIT capsule Take 1,000 Units by mouth every morning.    [provider]      Allergies    Denosumab, Influenza a (h1n1) monoval vac, Egg-derived products, Influenza vaccines, and Poultry meal    Review of Systems   Review of Systems  Constitutional:  Positive for fatigue and fever.  Respiratory:  Positive for cough and shortness of breath.   All other systems reviewed  and are negative.   Physical Exam Updated Vital Signs BP 126/78   Pulse 65   Temp 98 F (36.7 C) (Oral)   Resp (!) 28   Ht 5\' 5"  (1.651 m)   Wt 79.4 kg   SpO2 92%   BMI 29.12 kg/m  Physical Exam Vitals and nursing note reviewed.  Constitutional:      General: She is not in acute distress.    Appearance: She is well-developed. She is not ill-appearing, toxic-appearing or diaphoretic.  HENT:     Head: Normocephalic and atraumatic.     Mouth/Throat:     Mouth: Mucous membranes are moist.  Eyes:     Conjunctiva/sclera: Conjunctivae normal.  Cardiovascular:     Rate and Rhythm: Normal rate and regular rhythm.     Heart sounds: No murmur heard. Pulmonary:     Effort: Pulmonary  effort is normal. Tachypnea present. No respiratory distress.     Breath sounds: Rales present. No decreased breath sounds, wheezing or rhonchi.  Chest:     Chest wall: No tenderness.  Abdominal:     Palpations: Abdomen is soft.     Tenderness: There is no abdominal tenderness.  Musculoskeletal:        General: No swelling.     Cervical back: Normal range of motion and neck supple.     Right lower leg: No edema.     Left lower leg: No edema.  Skin:    General: Skin is warm and dry.     Coloration: Skin is not cyanotic or pale.  Neurological:     General: No focal deficit present.     Mental Status: She is alert and oriented to person, place, and time.  Psychiatric:        Mood and Affect: Mood normal.        Behavior: Behavior normal.     ED Results / Procedures / Treatments   Labs (all labs ordered are listed, but only abnormal results are displayed) Labs Reviewed  COMPREHENSIVE METABOLIC PANEL - Abnormal; Notable for the following components:      Result Value   Glucose, Bld 117 (*)    Albumin 3.2 (*)    All other components within normal limits  BRAIN NATRIURETIC PEPTIDE - Abnormal; Notable for the following components:   B Natriuretic Peptide 1,569.5 (*)    All other components within normal limits  GROUP A STREP BY PCR  RESP PANEL BY RT-PCR (RSV, FLU A&B, COVID)  [id number]  CBC WITH DIFFERENTIAL/PLATELET  MAGNESIUM  I-STAT CHEM 8, ED  TROPONIN I (HIGH SENSITIVITY)    EKG EKG Interpretation  Date/Time:  Monday December 13 2022 23:02:36 EDT Ventricular Rate:  77 PR Interval:  136 QRS Duration: 94 QT Interval:  390 QTC Calculation: 441 R Axis:   21 Text Interpretation: Normal sinus rhythm Cannot rule out Anterior infarct , age undetermined Abnormal ECG Confirmed by Gloris Manchester [phone]) on 12/14/2022 3:27:22 AM  Radiology CT Angio Chest PE W and/or Wo Contrast  Result Date: 12/14/2022 CLINICAL DATA:  77 year old female with history of shortness of breath. Nasal  drainage for several months. Worsening cough. EXAM: CT ANGIOGRAPHY CHEST WITH CONTRAST TECHNIQUE: Multidetector CT imaging of the chest was performed using the standard protocol during bolus administration of intravenous contrast. Multiplanar CT image reconstructions and MIPs were obtained to evaluate the vascular anatomy. RADIATION DOSE REDUCTION: This exam was performed according to the departmental dose-optimization program which includes automated exposure control, adjustment of the mA and/or  kV according to patient size and/or use of iterative reconstruction technique. CONTRAST:  75mL OMNIPAQUE IOHEXOL 350 MG/ML SOLN COMPARISON:  CTA chest 04/29/2016. FINDINGS: Cardiovascular: There are no central, lobar or proximal segmental sized filling defects to suggest pulmonary embolism. Distal segmental and subsegmental sized filling defects can not be completely excluded secondary to extensive respiratory motion on today's examination. Dilatation of the pulmonic trunk (3.5 cm in diameter) and right main pulmonary artery (3.6 cm in diameter), which may suggest pulmonary arterial hypertension. Heart size is enlarged with mild left atrial dilatation. There is no significant pericardial fluid, thickening or pericardial calcification. There is aortic atherosclerosis, as well as atherosclerosis of the great vessels of the mediastinum and the coronary arteries, including calcified atherosclerotic plaque in the left main, left anterior descending and left circumflex coronary arteries. Mediastinum/Nodes: Multiple prominent borderline enlarged and mildly enlarged mediastinal and bilateral hilar lymph nodes measuring up to 1.5 cm in the left hilar nodal station. Esophagus is unremarkable in appearance. No axillary lymphadenopathy. Lungs/Pleura: Widespread but patchy areas of ground-glass attenuation, septal thickening and frank airspace consolidation are scattered throughout the lungs bilaterally, most confluent in the left lower  lobe, indicative of severe multilobar bronchopneumonia. No pleural effusions. No definite suspicious appearing pulmonary nodules or masses are noted on today's motion limited examination. Upper Abdomen: Unremarkable. Musculoskeletal: There are no aggressive appearing lytic or blastic lesions noted in the visualized portions of the skeleton. Review of the MIP images confirms the above findings. IMPRESSION: 1. Motion limited study demonstrating no definite central, lobar or proximal segmental sized pulmonary embolism. 2. The appearance of the lungs is most compatible with severe multilobar bilateral bronchopneumonia. The possibility of some degree of underlying interstitial lung disease is not entirely excluded. Follow-up high-resolution chest CT should be considered in 3 months after complete resolution of the patient's acute illness to assess for areas of non reversible parenchymal lung disease. 3. Multiple borderline enlarged and mildly enlarged mediastinal and bilateral hilar lymph nodes could be reactive, or could be associated with underlying interstitial lung disease. Attention on follow-up imaging is recommended to ensure stability or regression. 4. Cardiomegaly with left atrial dilatation. 5. Dilatation of the pulmonic trunk and right main pulmonary artery, which may suggest underlying pulmonary arterial hypertension. 6. Aortic atherosclerosis, in addition to left main and 2 vessel coronary artery disease. Assessment for potential risk factor modification, dietary therapy or pharmacologic therapy may be warranted, if clinically indicated. Aortic Atherosclerosis (ICD10-I70.0). Electronically Signed   By: Trudie Reed M.D.   On: 12/14/2022 06:32   DG Chest 2 View  Result Date: 12/13/2022 CLINICAL DATA:  Cough. EXAM: CHEST - 2 VIEW COMPARISON:  Chest CT 04/29/2016, radiograph 12/27/2014 FINDINGS: Heart size upper normal. Interstitial and bronchial thickening, with more patchy opacities at the lung bases.  No pleural fluid or pneumothorax. No acute osseous abnormalities are seen. IMPRESSION: 1. Interstitial and bronchial thickening, with more patchy opacities at the lung bases. Findings likely represent atypical infection or pulmonary edema superimposed on chronic lung disease. 2. Upper normal heart size. Electronically Signed   By: Narda Rutherford M.D.   On: 12/13/2022 23:47    Procedures Procedures    Medications Ordered in ED Medications  furosemide (LASIX) injection 20 mg (has no administration in time range)  cefTRIAXone (ROCEPHIN) 1 g in sodium chloride 0.9 % 100 mL IVPB (0 g Intravenous Stopped 12/14/22 0618)  azithromycin (ZITHROMAX) 500 mg in sodium chloride 0.9 % 250 mL IVPB (500 mg Intravenous New Bag/Given 12/14/22 0529)  lactated  ringers bolus 500 mL (500 mLs Intravenous New Bag/Given 12/14/22 0622)  iohexol (OMNIPAQUE) 350 MG/ML injection 75 mL (75 mLs Intravenous Contrast Given 12/14/22 0550)    ED Course/ Medical Decision Making/ A&P                             Medical Decision Making Amount and/or Complexity of Data Reviewed Labs: ordered. Radiology: ordered.  Risk Prescription drug management.   This patient presents to the ED for concern of cough, shortness of breath, this involves an extensive number of treatment options, and is a complaint that carries with it a high risk of complications and morbidity.  The differential diagnosis includes pneumonia, viral illness, reactive airway disease, allergic reaction, PE, CHF   Co morbidities that complicate the patient evaluation  GERD, HTN, OSA, HLD   Additional history obtained:  Additional history obtained from N/A External records from outside source obtained and reviewed including EMR   Lab Tests:  I Ordered, and personally interpreted labs.  The pertinent results include: Normal hemoglobin, no leukocytosis, normal kidney function, normal electrolytes.  Markedly elevated BNP is present.   Imaging Studies  ordered:  I ordered imaging studies including chest x-ray, CTA chest I independently visualized and interpreted imaging which showed findings consistent with multifocal pneumonia.  She has cardiomegaly.  Enlarged mediastinal and bilateral hilar lymph nodes are present.  Pulmonary trunk is dilated suggestive of pulmonary hypertension I agree with the radiologist interpretation   Cardiac Monitoring: / EKG:  The patient was maintained on a cardiac monitor.  I personally viewed and interpreted the cardiac monitored which showed an underlying rhythm of: Sinus rhythm   Problem List / ED Course / Critical interventions / Medication management  Patient presents for cough and shortness of breath.  She states that she has had similar symptoms chronically for the past 2 years but this worsened over the past week.  She was seen urgent care prior to arrival where they identified an fever and evidence of multifocal pneumonia on x-ray.  On arrival in the ED, patient is afebrile.  She is tachypneic.  She is maintaining normal SpO2 on room air.  She is able to speak in complete sentences unless she has a coughing spell, which are severe at this time.  Crackles are appreciated on lung auscultation is warm to the touch.  Patient was treated empirically for pneumonia with ceftriaxone and azithromycin.  Lab work and imaging studies were ordered.  CT did not show evidence of PE.  It does show evidence of multifocal pneumonia.  Interestingly, patient's BNP is markedly elevated.  There is possibly a CHF component to her recent symptoms.  Dose of Lasix was ordered.  Per chart review, last echocardiogram was 7 years ago.  Heart function was normal at that time.  Patient would benefit from admission for continued treatment and further testing.  I ordered medication including antibiotics for multifocal pneumonia; Lasix for diuresis Reevaluation of the patient after these medicines showed that the patient stayed the same I have  reviewed the patients home medicines and have made adjustments as needed   Social Determinants of Health:  Does not have PCP        Final Clinical Impression(s) / ED Diagnoses Final diagnoses:  Multifocal pneumonia    Rx / DC Orders ED Discharge Orders     None         Gloris Manchester, MD 12/14/22 [phone]

## 2022-12-14 NOTE — Assessment & Plan Note (Signed)
On Statin therapy  Plan  Lipid panel in 48 hrs  Continue atorvastatin  Heart health diet

## 2022-12-14 NOTE — ED Notes (Signed)
Pt transported to vascular.  °

## 2022-12-14 NOTE — Assessment & Plan Note (Addendum)
Continue blood pressure control with losartan. Discontinue carvedilol.

## 2022-12-15 DIAGNOSIS — I5031 Acute diastolic (congestive) heart failure: Secondary | ICD-10-CM | POA: Diagnosis not present

## 2022-12-15 LAB — CBC
HCT: 37.9 % (ref 36.0–46.0)
Hemoglobin: 12.1 g/dL (ref 12.0–15.0)
MCH: 27.3 pg (ref 26.0–34.0)
MCHC: 31.9 g/dL (ref 30.0–36.0)
MCV: 85.4 fL (ref 80.0–100.0)
Platelets: 237 10*3/uL (ref 150–400)
RBC: 4.44 MIL/uL (ref 3.87–5.11)
RDW: 14.6 % (ref 11.5–15.5)
WBC: 9 10*3/uL (ref 4.0–10.5)
nRBC: 0 % (ref 0.0–0.2)

## 2022-12-15 LAB — BASIC METABOLIC PANEL
Anion gap: 9 (ref 5–15)
BUN: 20 mg/dL (ref 8–23)
CO2: 26 mmol/L (ref 22–32)
Calcium: 8.4 mg/dL — ABNORMAL LOW (ref 8.9–10.3)
Chloride: 102 mmol/L (ref 98–111)
Creatinine, Ser: 1.06 mg/dL — ABNORMAL HIGH (ref 0.44–1.00)
GFR, Estimated: 54 mL/min — ABNORMAL LOW (ref >60)
Glucose, Bld: 110 mg/dL — ABNORMAL HIGH (ref 70–99)
Potassium: 3.7 mmol/L (ref 3.5–5.1)
Sodium: 137 mmol/L (ref 135–145)

## 2022-12-15 MED ORDER — DM-GUAIFENESIN ER 30-600 MG PO TB12
1.0000 | ORAL_TABLET | Freq: Two times a day (BID) | ORAL | Status: DC
  Administered 2022-12-15 – 2022-12-16 (×4): 1 via ORAL
  Filled 2022-12-15 (×4): qty 1

## 2022-12-15 MED ORDER — IPRATROPIUM-ALBUTEROL 0.5-2.5 (3) MG/3ML IN SOLN
3.0000 mL | Freq: Four times a day (QID) | RESPIRATORY_TRACT | Status: DC
  Administered 2022-12-15: 3 mL via RESPIRATORY_TRACT
  Filled 2022-12-15: qty 3

## 2022-12-15 MED ORDER — LOSARTAN POTASSIUM 25 MG PO TABS
25.0000 mg | ORAL_TABLET | Freq: Every day | ORAL | Status: DC
  Administered 2022-12-16 – 2022-12-20 (×5): 25 mg via ORAL
  Filled 2022-12-15 (×5): qty 1

## 2022-12-15 MED ORDER — FUROSEMIDE 10 MG/ML IJ SOLN
20.0000 mg | Freq: Two times a day (BID) | INTRAMUSCULAR | Status: AC
  Administered 2022-12-15 (×2): 20 mg via INTRAVENOUS
  Filled 2022-12-15 (×2): qty 2

## 2022-12-15 MED ORDER — ONDANSETRON HCL 4 MG/2ML IJ SOLN
4.0000 mg | Freq: Four times a day (QID) | INTRAMUSCULAR | Status: DC | PRN
  Administered 2022-12-15: 4 mg via INTRAVENOUS
  Filled 2022-12-15: qty 2

## 2022-12-15 MED ORDER — METHYLPREDNISOLONE SODIUM SUCC 40 MG IJ SOLR
40.0000 mg | INTRAMUSCULAR | Status: DC
  Administered 2022-12-16 – 2022-12-20 (×5): 40 mg via INTRAVENOUS
  Filled 2022-12-15 (×5): qty 1

## 2022-12-15 MED ORDER — SODIUM CHLORIDE 0.9 % IV SOLN
1.0000 g | INTRAVENOUS | Status: DC
  Administered 2022-12-15 – 2022-12-20 (×6): 1 g via INTRAVENOUS
  Filled 2022-12-15 (×6): qty 10

## 2022-12-15 MED ORDER — METHYLPREDNISOLONE SODIUM SUCC 40 MG IJ SOLR
40.0000 mg | Freq: Two times a day (BID) | INTRAMUSCULAR | Status: DC
  Administered 2022-12-15: 40 mg via INTRAVENOUS
  Filled 2022-12-15: qty 1

## 2022-12-15 MED ORDER — AZITHROMYCIN 250 MG PO TABS
500.0000 mg | ORAL_TABLET | Freq: Every day | ORAL | Status: DC
  Administered 2022-12-15 – 2022-12-20 (×6): 500 mg via ORAL
  Filled 2022-12-15 (×6): qty 2

## 2022-12-15 MED ORDER — IPRATROPIUM-ALBUTEROL 0.5-2.5 (3) MG/3ML IN SOLN
3.0000 mL | Freq: Three times a day (TID) | RESPIRATORY_TRACT | Status: DC
  Administered 2022-12-15 – 2022-12-16 (×3): 3 mL via RESPIRATORY_TRACT
  Filled 2022-12-15 (×3): qty 3

## 2022-12-15 NOTE — TOC Initial Note (Signed)
Transition of Care [facility]) - Initial/Assessment Note    Patient Details  Name: Kaitlin Watkins MRN: [id number] Date of Birth: 08/19/1945  Transition of Care [facility]) CM/SW Contact:    Leone Haven, RN Phone Number: 12/15/2022, 5:09 PM  Clinical Narrative:                 From home lives with grand daughter, she does not have a PCP would like to be set up with a [facility], she has insurance with medication coverage.  She does not currently have any DME at home, she does not have any HH services in place at this time. She states her ex SIL will transport her home at dc, Cherlynn Kaiser and Jasmine December are her support system.  She gets her [facility] [place], prior level of function is indep. She is ok with [facility] pharmacy filling her meds.   Expected Discharge Plan: Home/Self Care Barriers to Discharge: Continued Medical Work up   Patient Goals and CMS Choice Patient states their goals for this hospitalization and ongoing recovery are:: return home   Choice offered to / list presented to : NA      Expected Discharge Plan and Services In-house Referral: NA, [facility] Discharge Planning Services: CM Consult Post Acute Care Choice: NA Living arrangements for the past 2 months: Single Family Home                 DME Arranged: N/A DME Agency: NA       HH Arranged: NA          Prior Living Arrangements/Services Living arrangements for the past 2 months: Single Family Home   Patient language and need for interpreter reviewed:: Yes Do you feel safe going back to the place where you live?: Yes      Need for Family Participation in Patient Care: Yes (Comment) Care giver support system in place?: Yes (comment)   Criminal Activity/Legal Involvement Pertinent to Current Situation/Hospitalization: No - Comment as needed  Activities of Daily Living Home Assistive Devices/Equipment: None ADL Screening (condition at time of admission) Patient's cognitive ability adequate to  safely complete daily activities?: Yes Is the patient deaf or have difficulty hearing?: No Does the patient have difficulty seeing, even when wearing glasses/contacts?: No Does the patient have difficulty concentrating, remembering, or making decisions?: No Patient able to express need for assistance with ADLs?: Yes Does the patient have difficulty dressing or bathing?: No Independently performs ADLs?: Yes (appropriate for developmental age) Does the patient have difficulty walking or climbing stairs?: No Weakness of Legs: None Weakness of Arms/Hands: None  Permission Sought/Granted                  Emotional Assessment Appearance:: Appears stated age Attitude/Demeanor/Rapport: Engaged Affect (typically observed): Appropriate Orientation: : Oriented to Self, Oriented to Place, Oriented to Situation, Oriented to  Time Alcohol / Substance Use: Not Applicable Psych Involvement: No (comment)  Admission diagnosis:  Acute CHF (HCC) [I50.9] Multifocal pneumonia [J18.9] Patient Active Problem List   Diagnosis Date Noted   Pneumonia 12/14/2022   Acute CHF (HCC) 12/14/2022   Obstructive sleep apnea treated with continuous positive airway pressure (CPAP) 06/16/2017   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   Chest pain    SOB (shortness of breath)    Syncope 12/27/2014   Left flank pain 12/27/2014   Numbness of arm 12/27/2014   Abdominal pain 12/27/2014   Epigastric abdominal pain    Syncope and collapse  Arterial hypotension    Knee pain 12/16/2013   Pre-operative clearance 12/16/2013   Routine general medical examination at a health care facility 12/16/2013   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   Leg swelling 11/17/2013   Need for prophylactic vaccination with combined diphtheria-tetanus-pertussis (DTP) vaccine 11/17/2013   Need for prophylactic vaccination against Streptococcus pneumoniae (pneumococcus) and influenza 11/17/2013   History of DVT of lower extremity 10/30/2013   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 [place] [address] Phone: [phone] Fax: [phone]  [facility] - [place], [place] - [address] [place] [address] Phone: [phone] Fax: [phone]  [id number] - [place], [place] - [address] [place] [address] Phone: [phone] Fax: [phone]     Social Determinants of Health (SDOH) Social History: SDOH Screenings   Food Insecurity: No Food Insecurity (12/14/2022)  Housing: Low Risk  (12/14/2022)  Transportation Needs: No Transportation Needs (12/14/2022)  Utilities: Not At Risk (12/14/2022)  Tobacco Use: Medium Risk (12/14/2022)   SDOH Interventions:     Readmission Risk Interventions     No data to display

## 2022-12-15 NOTE — Progress Notes (Signed)
Patient started to bathe and brush teeth but started feeling sick on stomach. She said she would try again later if not tomorrow.

## 2022-12-15 NOTE — Progress Notes (Signed)
PROGRESS NOTE    Kaitlin Watkins  GEX:[id number] DOB: May 19, 1946 DOA: 12/13/2022 PCP: Pcp, No  77/F with history of chronic bronchitis, long-term passive smoking exposure, hypertension, OSA, GERD presented to the ED with worsening cough congestion and shortness of breath.  She reports ongoing chronic cough for few years, symptoms worsened in the last 2 weeks with dyspnea on exertion and productive cough, denied PND orthopnea. Seen in urgent care where she was noted to have a temp of 103.2, x-ray noted multifocal pneumonia, sent to the ED. -Afebrile here, vital signs were stable BNP 1569, WBC normal, CTA chest noted severe multilobar bronchopneumonia with adenopathy, cardiomegaly and pulmonary hypertension   Subjective: Still coughing, productive, some wheezing  Assessment and Plan:  Multilobar pneumonia COPD exacerbation -Temp of 103 at urgent care, afebrile since arrival in the ED, with productive cough and CT concern for multilobar pneumonia will start ceftriaxone and azithromycin, check CRP -Add IV steroids, DuoNebs -Flutter valve, Mucinex -Will need pulmonary follow-up for PFTs as outpatient  Mild acute diastolic CHF -Continue low-dose IV Lasix 1 more day, resumed ARB, -2D echo with preserved EF, grade 1 diastolic dysfunction, normal RV -BMP in a.m.  Obstructive sleep apnea treated with continuous positive airway pressure (CPAP) Patient not using CPAP at home.  HLD (hyperlipidemia) On Statin therapy  Essential hypertension, benign -Continue Cozaar, started on low-dose Coreg on admission  DVT prophylaxis: Code Status:  Family Communication: Disposition Plan:   Consultants:    Procedures:   Antimicrobials:    Objective: Vitals:   12/15/22 0012 12/15/22 0450 12/15/22 0745 12/15/22 0806  BP: 122/71 133/78 97/60   Pulse: 78 78 83   Resp: 13 13 19    Temp: 97.8 F (36.6 C) 97.8 F (36.6 C)  97.7 F (36.5 C)  TempSrc: Oral Oral  Oral  SpO2: 96% 98% 92%    Weight:  80.1 kg    Height:        Intake/Output Summary (Last 24 hours) at 12/15/2022 1053 Last data filed at 12/15/2022 0900 Gross per 24 hour  Intake 118 ml  Output 1100 ml  Net -982 ml   Filed Weights   12/13/22 2313 12/14/22 1500 12/15/22 0450  Weight: 79.4 kg 81.1 kg 80.1 kg    Examination:  General exam: Pleasant thinly built female laying in bed, AAOx3 HEENT: No JVD CVS: S1-S2, regular rhythm Lungs: Diffuse scattered rhonchi, few expiratory wheezes Abdomen: Soft, nontender, bowel sounds present Extremities: Trace edema  Skin: No rashes Psychiatry:  Mood & affect appropriate.     Data Reviewed:   CBC: Recent Labs  Lab 12/14/22 0355 12/14/22 0754 12/14/22 1656 12/15/22 0015  WBC 8.8  --  8.7 9.0  NEUTROABS 6.5  --   --   --   HGB 13.0 12.2 11.4* 12.1  HCT 40.6 36.0 36.0 37.9  MCV 88.6  --  86.5 85.4  PLT 234  --  217 237   Basic Metabolic Panel: Recent Labs  Lab 12/14/22 0355 12/14/22 0754 12/14/22 1015 12/15/22 0015  NA 140 141  --  137  K 3.7 3.7  --  3.7  CL 101 104  --  102  CO2 26  --   --  26  GLUCOSE 117* 108*  --  110*  BUN 14 15  --  20  CREATININE 0.93 0.90 0.78 1.06*  CALCIUM 9.0  --   --  8.4*  MG 2.1  --   --   --    GFR: Estimated Creatinine  Clearance: 45.7 mL/min (A) (by C-G formula based on SCr of 1.06 mg/dL (H)). Liver Function Tests: Recent Labs  Lab 12/14/22 0355  AST 22  ALT 13  ALKPHOS 78  BILITOT 0.8  PROT 6.8  ALBUMIN 3.2*   No results for input(s): "LIPASE", "AMYLASE" in the last 168 hours. No results for input(s): "AMMONIA" in the last 168 hours. Coagulation Profile: No results for input(s): "INR", "PROTIME" in the last 168 hours. Cardiac Enzymes: No results for input(s): "CKTOTAL", "CKMB", "CKMBINDEX", "TROPONINI" in the last 168 hours. BNP (last 3 results) No results for input(s): "PROBNP" in the last 8760 hours. HbA1C: No results for input(s): "HGBA1C" in the last 72 hours. CBG: No results for  input(s): "GLUCAP" in the last 168 hours. Lipid Profile: No results for input(s): "CHOL", "HDL", "LDLCALC", "TRIG", "CHOLHDL", "LDLDIRECT" in the last 72 hours. Thyroid Function Tests: No results for input(s): "TSH", "T4TOTAL", "FREET4", "T3FREE", "THYROIDAB" in the last 72 hours. Anemia Panel: No results for input(s): "VITAMINB12", "FOLATE", "FERRITIN", "TIBC", "IRON", "RETICCTPCT" in the last 72 hours. Urine analysis:    Component Value Date/Time   COLORURINE YELLOW 12/26/2014 2340   APPEARANCEUR CLEAR 12/26/2014 2340   LABSPEC 1.008 12/26/2014 2340   PHURINE 6.0 12/26/2014 2340   GLUCOSEU NEGATIVE 12/26/2014 2340   HGBUR NEGATIVE 12/26/2014 2340   BILIRUBINUR NEGATIVE 12/26/2014 2340   KETONESUR NEGATIVE 12/26/2014 2340   PROTEINUR NEGATIVE 12/26/2014 2340   UROBILINOGEN 0.2 12/26/2014 2340   NITRITE NEGATIVE 12/26/2014 2340   LEUKOCYTESUR NEGATIVE 12/26/2014 2340   Sepsis Labs: @LABRCNTIP (procalcitonin:4,lacticidven:4)  ) Recent Results (from the past 240 hour(s))  Group A Strep by PCR     Status: None   Collection Time: 12/13/22 11:21 PM   Specimen: Throat; Sterile Swab  Result Value Ref Range Status   Group A Strep by PCR NOT DETECTED NOT DETECTED Final    Comment: Performed at [facility], [address], [place], [place] [address]  Resp panel by RT-PCR (RSV, Flu A&B, Covid) Anterior Nasal Swab     Status: None   Collection Time: 12/14/22  3:28 AM   Specimen: Anterior Nasal Swab  Result Value Ref Range Status   SARS Coronavirus 2 by RT PCR NEGATIVE NEGATIVE Final   Influenza A by PCR NEGATIVE NEGATIVE Final   Influenza B by PCR NEGATIVE NEGATIVE Final    Comment: (NOTE) The Xpert Xpress SARS-CoV-2/FLU/RSV plus assay is intended as an aid in the diagnosis of influenza from Nasopharyngeal swab specimens and should not be used as a sole basis for treatment. Nasal washings and aspirates are unacceptable for Xpert Xpress SARS-CoV-2/FLU/RSV testing.  Fact Sheet  for Patients: BloggerCourse.com  Fact Sheet for Healthcare Providers: SeriousBroker.it  This test is not yet approved or cleared by the [place] FDA and has been authorized for detection and/or diagnosis of SARS-CoV-2 by FDA under an Emergency Use Authorization (EUA). This EUA will remain in effect (meaning this test can be used) for the duration of the COVID-19 declaration under Section 564(b)(1) of the Act, 21 U.S.C. section 360bbb-3(b)(1), unless the authorization is terminated or revoked.     Resp Syncytial Virus by PCR NEGATIVE NEGATIVE Final    Comment: (NOTE) Fact Sheet for Patients: BloggerCourse.com  Fact Sheet for Healthcare Providers: SeriousBroker.it  This test is not yet approved or cleared by the [place] FDA and has been authorized for detection and/or diagnosis of SARS-CoV-2 by FDA under an Emergency Use Authorization (EUA). This EUA will remain in effect (meaning this test  can be used) for the duration of the COVID-19 declaration under Section 564(b)(1) of the Act, 21 U.S.C. section 360bbb-3(b)(1), unless the authorization is terminated or revoked.  Performed at [facility], [address] [place], [place] [address]      Radiology Studies: ECHOCARDIOGRAM COMPLETE  Result Date: 12/14/2022    ECHOCARDIOGRAM REPORT   Patient Name:   Charissa Bash Date of Exam: 12/14/2022 Medical Rec #:  [id number]         Height:       65.0 in Accession #:    [id number]        Weight:       175.0 lb Date of Birth:  July 12, 1945          BSA:          1.869 m Patient Age:    76 years          BP:           114/70 mmHg Patient Gender: F                 HR:           75 bpm. Exam Location:  Inpatient Procedure: 2D Echo, Color Doppler and Cardiac Doppler Indications:    acute systolic chf  History:        Patient has prior history of Echocardiogram examinations, most                  recent 04/29/2016. Signs/Symptoms:Dyspnea; Risk                 Factors:Hypertension, Dyslipidemia and Sleep Apnea.  Sonographer:    Delcie Roch RDCS Referring Phys: [id number] MICHAEL E NORINS IMPRESSIONS  1. Left ventricular ejection fraction, by estimation, is 60 to 65%. The left ventricle has normal function. The left ventricle has no regional wall motion abnormalities. Left ventricular diastolic parameters are consistent with Grade I diastolic dysfunction (impaired relaxation).  2. Right ventricular systolic function is normal. The right ventricular size is mildly enlarged. There is normal pulmonary artery systolic pressure. The estimated right ventricular systolic pressure is 33.7 mmHg.  3. The mitral valve is grossly normal. Trivial mitral valve regurgitation. No evidence of mitral stenosis.  4. The aortic valve is tricuspid. Aortic valve regurgitation is not visualized. Aortic valve sclerosis is present, with no evidence of aortic valve stenosis.  5. The inferior vena cava is normal in size with greater than 50% respiratory variability, suggesting right atrial pressure of 3 mmHg. FINDINGS  Left Ventricle: Left ventricular ejection fraction, by estimation, is 60 to 65%. The left ventricle has normal function. The left ventricle has no regional wall motion abnormalities. The left ventricular internal cavity size was normal in size. There is  no left ventricular hypertrophy. Left ventricular diastolic parameters are consistent with Grade I diastolic dysfunction (impaired relaxation). Right Ventricle: The right ventricular size is mildly enlarged. No increase in right ventricular wall thickness. Right ventricular systolic function is normal. There is normal pulmonary artery systolic pressure. The tricuspid regurgitant velocity is 2.77  m/s, and with an assumed right atrial pressure of 3 mmHg, the estimated right ventricular systolic pressure is 33.7 mmHg. Left Atrium: Left atrial size was normal in  size. Right Atrium: Right atrial size was normal in size. Pericardium: There is no evidence of pericardial effusion. Mitral Valve: The mitral valve is grossly normal. Trivial mitral valve regurgitation. No evidence of mitral valve stenosis. Tricuspid Valve: The tricuspid valve is grossly normal.  Tricuspid valve regurgitation is mild . No evidence of tricuspid stenosis. Aortic Valve: The aortic valve is tricuspid. Aortic valve regurgitation is not visualized. Aortic valve sclerosis is present, with no evidence of aortic valve stenosis. Pulmonic Valve: The pulmonic valve was grossly normal. Pulmonic valve regurgitation is not visualized. No evidence of pulmonic stenosis. Aorta: The aortic root and ascending aorta are structurally normal, with no evidence of dilitation. Venous: The inferior vena cava is normal in size with greater than 50% respiratory variability, suggesting right atrial pressure of 3 mmHg. IAS/Shunts: The atrial septum is grossly normal.  LEFT VENTRICLE PLAX 2D LVIDd:         5.10 cm   Diastology LVIDs:         3.20 cm   LV e' medial:    6.09 cm/s LV PW:         1.00 cm   LV E/e' medial:  10.4 LV IVS:        1.00 cm   LV e' lateral:   7.40 cm/s LVOT diam:     1.80 cm   LV E/e' lateral: 8.6 LV SV:         63 LV SV Index:   33 LVOT Area:     2.54 cm  RIGHT VENTRICLE             IVC RV Basal diam:  2.60 cm     IVC diam: 1.90 cm RV S prime:     18.80 cm/s TAPSE (M-mode): 3.5 cm LEFT ATRIUM             Index        RIGHT ATRIUM           Index LA diam:        3.30 cm 1.77 cm/m   RA Area:     13.00 cm LA Vol (A2C):   42.1 ml 22.53 ml/m  RA Volume:   24.60 ml  13.16 ml/m LA Vol (A4C):   47.5 ml 25.41 ml/m LA Biplane Vol: 44.9 ml 24.02 ml/m  AORTIC VALVE LVOT Vmax:   119.00 cm/s LVOT Vmean:  79.800 cm/s LVOT VTI:    0.246 m  AORTA Ao Root diam: 3.10 cm Ao Asc diam:  3.30 cm MITRAL VALVE               TRICUSPID VALVE MV Area (PHT): 3.91 cm    TR Peak grad:   30.7 mmHg MV Decel Time: 194 msec    TR  Vmax:        277.00 cm/s MV E velocity: 63.60 cm/s MV A velocity: 85.90 cm/s  SHUNTS MV E/A ratio:  0.74        Systemic VTI:  0.25 m                            Systemic Diam: 1.80 cm Lennie Odor MD Electronically signed by Lennie Odor MD Signature Date/Time: 12/14/2022/2:43:54 PM    Final    CT Angio Chest PE W and/or Wo Contrast  Result Date: 12/14/2022 CLINICAL DATA:  77 year old female with history of shortness of breath. Nasal drainage for several months. Worsening cough. EXAM: CT ANGIOGRAPHY CHEST WITH CONTRAST TECHNIQUE: Multidetector CT imaging of the chest was performed using the standard protocol during bolus administration of intravenous contrast. Multiplanar CT image reconstructions and MIPs were obtained to evaluate the vascular anatomy. RADIATION DOSE REDUCTION: This exam was performed according to the departmental dose-optimization  program which includes automated exposure control, adjustment of the mA and/or kV according to patient size and/or use of iterative reconstruction technique. CONTRAST:  75mL OMNIPAQUE IOHEXOL 350 MG/ML SOLN COMPARISON:  CTA chest 04/29/2016. FINDINGS: Cardiovascular: There are no central, lobar or proximal segmental sized filling defects to suggest pulmonary embolism. Distal segmental and subsegmental sized filling defects can not be completely excluded secondary to extensive respiratory motion on today's examination. Dilatation of the pulmonic trunk (3.5 cm in diameter) and right main pulmonary artery (3.6 cm in diameter), which may suggest pulmonary arterial hypertension. Heart size is enlarged with mild left atrial dilatation. There is no significant pericardial fluid, thickening or pericardial calcification. There is aortic atherosclerosis, as well as atherosclerosis of the great vessels of the mediastinum and the coronary arteries, including calcified atherosclerotic plaque in the left main, left anterior descending and left circumflex coronary arteries.  Mediastinum/Nodes: Multiple prominent borderline enlarged and mildly enlarged mediastinal and bilateral hilar lymph nodes measuring up to 1.5 cm in the left hilar nodal station. Esophagus is unremarkable in appearance. No axillary lymphadenopathy. Lungs/Pleura: Widespread but patchy areas of ground-glass attenuation, septal thickening and frank airspace consolidation are scattered throughout the lungs bilaterally, most confluent in the left lower lobe, indicative of severe multilobar bronchopneumonia. No pleural effusions. No definite suspicious appearing pulmonary nodules or masses are noted on today's motion limited examination. Upper Abdomen: Unremarkable. Musculoskeletal: There are no aggressive appearing lytic or blastic lesions noted in the visualized portions of the skeleton. Review of the MIP images confirms the above findings. IMPRESSION: 1. Motion limited study demonstrating no definite central, lobar or proximal segmental sized pulmonary embolism. 2. The appearance of the lungs is most compatible with severe multilobar bilateral bronchopneumonia. The possibility of some degree of underlying interstitial lung disease is not entirely excluded. Follow-up high-resolution chest CT should be considered in 3 months after complete resolution of the patient's acute illness to assess for areas of non reversible parenchymal lung disease. 3. Multiple borderline enlarged and mildly enlarged mediastinal and bilateral hilar lymph nodes could be reactive, or could be associated with underlying interstitial lung disease. Attention on follow-up imaging is recommended to ensure stability or regression. 4. Cardiomegaly with left atrial dilatation. 5. Dilatation of the pulmonic trunk and right main pulmonary artery, which may suggest underlying pulmonary arterial hypertension. 6. Aortic atherosclerosis, in addition to left main and 2 vessel coronary artery disease. Assessment for potential risk factor modification, dietary  therapy or pharmacologic therapy may be warranted, if clinically indicated. Aortic Atherosclerosis (ICD10-I70.0). Electronically Signed   By: Trudie Reed M.D.   On: 12/14/2022 06:32   DG Chest 2 View  Result Date: 12/13/2022 CLINICAL DATA:  Cough. EXAM: CHEST - 2 VIEW COMPARISON:  Chest CT 04/29/2016, radiograph 12/27/2014 FINDINGS: Heart size upper normal. Interstitial and bronchial thickening, with more patchy opacities at the lung bases. No pleural fluid or pneumothorax. No acute osseous abnormalities are seen. IMPRESSION: 1. Interstitial and bronchial thickening, with more patchy opacities at the lung bases. Findings likely represent atypical infection or pulmonary edema superimposed on chronic lung disease. 2. Upper normal heart size. Electronically Signed   By: Narda Rutherford M.D.   On: 12/13/2022 23:47     Scheduled Meds:  aspirin EC  81 mg Oral Daily   atorvastatin  80 mg Oral QPM   carvedilol  3.125 mg Oral BID WC   dextromethorphan-guaiFENesin  1 tablet Oral BID   enoxaparin (LOVENOX) injection  40 mg Subcutaneous Q24H   ezetimibe  10 mg  Oral Daily   furosemide  20 mg Intravenous Q12H   ipratropium-albuterol  3 mL Nebulization QID   [START ON 12/16/2022] losartan  25 mg Oral Daily   methylPREDNISolone (SOLU-MEDROL) injection  40 mg Intravenous Q12H   pantoprazole  40 mg Oral BID   senna  1 tablet Oral BID   sodium chloride flush  3 mL Intravenous Q12H   Continuous Infusions:  sodium chloride       LOS: 1 day    Time spent:    Zannie Cove, MD [facility]   12/15/2022, 10:53 AM

## 2022-12-16 DIAGNOSIS — J189 Pneumonia, unspecified organism: Secondary | ICD-10-CM

## 2022-12-16 DIAGNOSIS — I10 Essential (primary) hypertension: Secondary | ICD-10-CM | POA: Diagnosis not present

## 2022-12-16 DIAGNOSIS — E785 Hyperlipidemia, unspecified: Secondary | ICD-10-CM

## 2022-12-16 DIAGNOSIS — I5033 Acute on chronic diastolic (congestive) heart failure: Secondary | ICD-10-CM

## 2022-12-16 LAB — BASIC METABOLIC PANEL
Anion gap: 11 (ref 5–15)
BUN: 28 mg/dL — ABNORMAL HIGH (ref 8–23)
CO2: 29 mmol/L (ref 22–32)
Calcium: 8.8 mg/dL — ABNORMAL LOW (ref 8.9–10.3)
Chloride: 95 mmol/L — ABNORMAL LOW (ref 98–111)
Creatinine, Ser: 0.94 mg/dL (ref 0.44–1.00)
GFR, Estimated: >60 mL/min (ref >60)
Glucose, Bld: 143 mg/dL — ABNORMAL HIGH (ref 70–99)
Potassium: 4.1 mmol/L (ref 3.5–5.1)
Sodium: 135 mmol/L (ref 135–145)

## 2022-12-16 LAB — CBC
HCT: 40.6 % (ref 36.0–46.0)
Hemoglobin: 13.2 g/dL (ref 12.0–15.0)
MCH: 27.5 pg (ref 26.0–34.0)
MCHC: 32.5 g/dL (ref 30.0–36.0)
MCV: 84.6 fL (ref 80.0–100.0)
Platelets: 294 10*3/uL (ref 150–400)
RBC: 4.8 MIL/uL (ref 3.87–5.11)
RDW: 14.2 % (ref 11.5–15.5)
WBC: 7.1 10*3/uL (ref 4.0–10.5)
nRBC: 0 % (ref 0.0–0.2)

## 2022-12-16 LAB — BRAIN NATRIURETIC PEPTIDE: B Natriuretic Peptide: 53.8 pg/mL (ref 0.0–100.0)

## 2022-12-16 LAB — C-REACTIVE PROTEIN: CRP: 9.4 mg/dL — ABNORMAL HIGH (ref <1.0)

## 2022-12-16 MED ORDER — IPRATROPIUM-ALBUTEROL 0.5-2.5 (3) MG/3ML IN SOLN: 3.0000 mL | Freq: Four times a day (QID) | RESPIRATORY_TRACT | Status: DC | PRN

## 2022-12-16 NOTE — Progress Notes (Signed)
  Progress Note   Patient: Kaitlin Watkins ZOX:[id number] DOB: 02-09-1946 DOA: 12/13/2022     2 DOS: the patient was seen and examined on 12/16/2022   Brief hospital course: [name] with history of chronic bronchitis, long-term passive smoking exposure, hypertension, OSA, GERD presented to the ED with worsening cough congestion and shortness of breath.  She reports ongoing chronic cough for few years, symptoms worsened in the last 2 weeks with dyspnea on exertion and productive cough, denied PND orthopnea. Seen in urgent care where she was noted to have a temp of 103.2, x-ray noted multifocal pneumonia, sent to the ED. -Afebrile here, vital signs were stable BNP 1569, WBC normal, CTA chest noted severe multilobar bronchopneumonia with adenopathy, cardiomegaly and pulmonary hypertension  Assessment and Plan: * Acute on chronic diastolic CHF (congestive heart failure) (HCC) Echocardiogram with preserved LV systolic function.  Patient had furosemide on admission. Today with clinical euvolemia.    Pneumonia COPD exacerbation.   Patient with improvement in her dyspnea, plan to continue antibiotic therapy with ceftriaxone and azithromycin. Continue bronchodilator therapy and corticosteroids.  Supplemental 02 per [place] Airway clearing techniques with flutter valve and incentive spirometer.   Essential hypertension, benign Continue blood pressure control with carvedilol and losartan.   HLD (hyperlipidemia) Continue with statin therapy.   Obstructive sleep apnea treated with continuous positive airway pressure (CPAP) Patient not using CPAP at home.         Subjective: Patient is feeling better, dyspnea is improving, no chest pain.   Physical Exam: Vitals:   12/16/22 0723 12/16/22 0850 12/16/22 1124 12/16/22 1536  BP: (!) 96/53  106/66 (!) 126/93  Pulse: 68  68 74  Resp: 18  20 20   Temp: 98.2 F (36.8 C)  98.1 F (36.7 C) 97.8 F (36.6 C)  TempSrc: Oral  Oral Oral  SpO2: 98% 95%  98% 94%  Weight:      Height:       Neurology awake and alert ENT With mild pallor Cardiovascular with S1 and S2 present and rhythmic with no gallops, rubs or murmurs Respiratory with rales bilaterally and scattered rhonchi with no wheezing Abdomen with no distention  No lower extremity edema  Data Reviewed:    Family Communication: no family at the bedside   Disposition: Status is: Inpatient Remains inpatient appropriate because: resolving pneumonia   Planned Discharge Destination: Home    Author: Coralie Keens, MD 12/16/2022 5:20 PM  For on call review [email or web address].

## 2022-12-16 NOTE — Progress Notes (Signed)
Heart Failure Navigator Progress Note  Assessed for Heart & Vascular TOC clinic readiness.  Patient does not meet criteria due to EF 60-65%, Patient admitted with PNA, No HF TOC per Dr. Ella Jubilee. .   Navigator will sign off at this time.   Rhae Hammock, BSN, Scientist, clinical (histocompatibility and immunogenetics) Only

## 2022-12-16 NOTE — Hospital Course (Addendum)
Kaitlin Watkins was admitted to the hospital with the working diagnosis of COPD exacerbation in the setting of pneumonia.   [name] with history of chronic bronchitis, long-term passive smoking exposure, hypertension, OSA, GERD presented to the ED with worsening cough congestion and shortness of breath.  She reports ongoing chronic cough for few years, symptoms worsened in the last 2 weeks with dyspnea on exertion and productive cough, denied PND orthopnea. Seen in urgent care where she was noted to have a temp of 103.2, x-ray noted multifocal pneumonia, sent to the ED. At the time of her ED evaluation her blood pressure was 120/70, HR 73, RR 28 and 02 saturation 99% on supplemental 02. Her lungs had rhonchi and rales, with decrease breath sounds, and wheezing, heart with S1 and S2 present and rhythmic, abdomen with no distention and positive lower extremity edema.   Na 141, K 3,7 Cl 104 bicarbonate 26, glucose 108, bun 15 cr 0,90  High sensitive troponin 10 and 9  Procalcitonin <0.10  Wbc 8,7 hgb 11.4 plt 217   Sars covid negative Influenza negative   Chest radiograph with hyperinflation, increased lung markings more dense at the left lower lobe.   CT chest with bilateral ground glass opacities, more dense at the left lower lobe and right upper lobe. Septal thickening.  No central pulmonary embolism.  Multiple borderline enlarged and mildly enlarged mediastinal and bilateral hilar lymph nodes.   EKG 77 bpm, normal axis, normal intervals, sinus rhythm with no significant ST segment or T wave changes.   Patient was placed on antibiotic therapy and systemic corticosteroids.  Echocardiogram with preserved LV systolic function.

## 2022-12-17 DIAGNOSIS — J189 Pneumonia, unspecified organism: Secondary | ICD-10-CM | POA: Diagnosis not present

## 2022-12-17 DIAGNOSIS — G4733 Obstructive sleep apnea (adult) (pediatric): Secondary | ICD-10-CM

## 2022-12-17 DIAGNOSIS — E785 Hyperlipidemia, unspecified: Secondary | ICD-10-CM | POA: Diagnosis not present

## 2022-12-17 DIAGNOSIS — I5033 Acute on chronic diastolic (congestive) heart failure: Secondary | ICD-10-CM | POA: Diagnosis not present

## 2022-12-17 DIAGNOSIS — I10 Essential (primary) hypertension: Secondary | ICD-10-CM | POA: Diagnosis not present

## 2022-12-17 LAB — CBC
HCT: 43.7 % (ref 36.0–46.0)
Hemoglobin: 14 g/dL (ref 12.0–15.0)
MCH: 27.1 pg (ref 26.0–34.0)
MCHC: 32 g/dL (ref 30.0–36.0)
MCV: 84.7 fL (ref 80.0–100.0)
Platelets: 380 10*3/uL (ref 150–400)
RBC: 5.16 MIL/uL — ABNORMAL HIGH (ref 3.87–5.11)
RDW: 14 % (ref 11.5–15.5)
WBC: 8.6 10*3/uL (ref 4.0–10.5)
nRBC: 0 % (ref 0.0–0.2)

## 2022-12-17 LAB — BASIC METABOLIC PANEL
Anion gap: 12 (ref 5–15)
BUN: 35 mg/dL — ABNORMAL HIGH (ref 8–23)
CO2: 27 mmol/L (ref 22–32)
Calcium: 9.1 mg/dL (ref 8.9–10.3)
Chloride: 95 mmol/L — ABNORMAL LOW (ref 98–111)
Creatinine, Ser: 1.09 mg/dL — ABNORMAL HIGH (ref 0.44–1.00)
GFR, Estimated: 52 mL/min — ABNORMAL LOW (ref >60)
Glucose, Bld: 114 mg/dL — ABNORMAL HIGH (ref 70–99)
Potassium: 4.2 mmol/L (ref 3.5–5.1)
Sodium: 134 mmol/L — ABNORMAL LOW (ref 135–145)

## 2022-12-17 MED ORDER — IPRATROPIUM-ALBUTEROL 0.5-2.5 (3) MG/3ML IN SOLN
3.0000 mL | RESPIRATORY_TRACT | Status: DC | PRN
  Administered 2022-12-18 – 2022-12-19 (×2): 3 mL via RESPIRATORY_TRACT
  Filled 2022-12-17 (×3): qty 3

## 2022-12-17 MED ORDER — GUAIFENESIN-DM 100-10 MG/5ML PO SYRP
5.0000 mL | ORAL_SOLUTION | ORAL | Status: DC | PRN
  Administered 2022-12-17 – 2022-12-19 (×8): 5 mL via ORAL
  Filled 2022-12-17 (×8): qty 5

## 2022-12-17 MED ORDER — IPRATROPIUM-ALBUTEROL 0.5-2.5 (3) MG/3ML IN SOLN
3.0000 mL | Freq: Four times a day (QID) | RESPIRATORY_TRACT | Status: DC
  Administered 2022-12-17 (×2): 3 mL via RESPIRATORY_TRACT
  Filled 2022-12-17 (×3): qty 3

## 2022-12-17 NOTE — Progress Notes (Addendum)
Progress Note   Patient: CIERRIA KOGAN ZOX:[id number] DOB: 11-26-1945 DOA: 12/13/2022     3 DOS: the patient was seen and examined on 12/17/2022   Brief hospital course: Mrs. Fein was admitted to the hospital with the working diagnosis of COPD exacerbation in the setting of pneumonia.   [name] with history of chronic bronchitis, long-term passive smoking exposure, hypertension, OSA, GERD presented to the ED with worsening cough congestion and shortness of breath.  She reports ongoing chronic cough for few years, symptoms worsened in the last 2 weeks with dyspnea on exertion and productive cough, denied PND orthopnea. Seen in urgent care where she was noted to have a temp of 103.2, x-ray noted multifocal pneumonia, sent to the ED. At the time of her ED evaluation her blood pressure was 120/70, HR 73, RR 28 and 02 saturation 99% on supplemental 02. Her lungs had rhonchi and rales, with decrease breath sounds, and wheezing, heart with S1 and S2 present and rhythmic, abdomen with no distention and positive lower extremity edema.   Na 141, K 3,7 Cl 104 bicarbonate 26, glucose 108, bun 15 cr 0,90  High sensitive troponin 10 and 9  Procalcitonin <0.10  Wbc 8,7 hgb 11.4 plt 217   Sars covid negative Influenza negative   Chest radiograph with hyperinflation, increased lung markings more dense at the left lower lobe.   CT chest with bilateral ground glass opacities, more dense at the left lower lobe and right upper lobe. Septal thickening.  No central pulmonary embolism.  Multiple borderline enlarged and mildly enlarged mediastinal and bilateral hilar lymph nodes.   EKG 77 bpm, normal axis, normal intervals, sinus rhythm with no significant ST segment or T wave changes.   Patient was placed on antibiotic therapy and systemic corticosteroids.  Echocardiogram with preserved LV systolic function.   Assessment and Plan: * Multifocal pneumonia COPD exacerbation.   Imaging suggestive  interstitial lung disease. At home with second hand smoke exposure. 02 saturation today 93% on 2 L/min per [place].   Continue with dyspnea and not back to baseline Plan to continue antibiotic therapy with ceftriaxone and azithromycin. Continue bronchodilator therapy and corticosteroids.  Supplemental 02 per [place] Airway clearing techniques with flutter valve and incentive spirometer.   Will need outpatient PFT and high resolution CT scan.   Acute on chronic diastolic CHF (congestive heart failure) (HCC) Echocardiogram with preserved LV systolic function 60 to 65%, no LVH, RV with mild dilatation, RVSP 33.7 mmHg. RA and LA with no dilatation. No significant valvular disease.   Continue blood pressure monitoring. Continue losartan for blood pressure control, hold on carvedilol for now.  Hold on further diuretic therapy.    Essential hypertension, benign Continue blood pressure control with losartan. Discontinue carvedilol.    HLD (hyperlipidemia) Continue with statin therapy.   Obstructive sleep apnea treated with continuous positive airway pressure (CPAP) Patient not using CPAP at home.    Subjective: Patient with no chest pain, dyspnea is improving but not back to baseline, continue to have dry cough.   Physical Exam: Vitals:   12/16/22 1934 12/16/22 2314 12/17/22 0403 12/17/22 0748  BP: 111/71 (!) 130/92 118/66 106/69  Pulse: 64 63 60 74  Resp: 20 20 18 18   Temp: 98 F (36.7 C) 97.8 F (36.6 C) 97.9 F (36.6 C) 97.8 F (36.6 C)  TempSrc: Oral Oral Oral Oral  SpO2: 93% 94% 98% 93%  Weight:   80.1 kg   Height:       Neurology  awake and alert ENT with mild pallor Cardiovascular with S1 and S2 present and rhythmic with no gallops, rubs or murmurs No JVD No lower extremity edema Respiratory with rales at bases more left than right, positive rhonchi and bilateral wheezing Abdomen with no distention  Data Reviewed:    Family Communication: no family at the bedside    Disposition: Status is: Inpatient Remains inpatient appropriate because: respiratory failure   Planned Discharge Destination: Home  Author: Coralie Keens, MD 12/17/2022 9:36 AM  For on call review [email or web address].

## 2022-12-17 NOTE — Care Management Important Message (Signed)
Important Message  Patient Details  Name: Kaitlin Watkins MRN: [id number] Date of Birth: 10/23/1945   Medicare Important Message Given:  Yes     Sherilyn Banker 12/17/2022, 4:31 PM

## 2022-12-17 NOTE — Progress Notes (Signed)
Incentive spirometer and flutter valve education given to patient. Teach back and use was successful.

## 2022-12-18 DIAGNOSIS — I10 Essential (primary) hypertension: Secondary | ICD-10-CM | POA: Diagnosis not present

## 2022-12-18 DIAGNOSIS — J189 Pneumonia, unspecified organism: Secondary | ICD-10-CM | POA: Diagnosis not present

## 2022-12-18 DIAGNOSIS — E785 Hyperlipidemia, unspecified: Secondary | ICD-10-CM | POA: Diagnosis not present

## 2022-12-18 DIAGNOSIS — I5033 Acute on chronic diastolic (congestive) heart failure: Secondary | ICD-10-CM | POA: Diagnosis not present

## 2022-12-18 LAB — BASIC METABOLIC PANEL
Anion gap: 10 (ref 5–15)
BUN: 28 mg/dL — ABNORMAL HIGH (ref 8–23)
CO2: 29 mmol/L (ref 22–32)
Calcium: 8.7 mg/dL — ABNORMAL LOW (ref 8.9–10.3)
Chloride: 96 mmol/L — ABNORMAL LOW (ref 98–111)
Creatinine, Ser: 0.78 mg/dL (ref 0.44–1.00)
GFR, Estimated: >60 mL/min (ref >60)
Glucose, Bld: 99 mg/dL (ref 70–99)
Potassium: 4.5 mmol/L (ref 3.5–5.1)
Sodium: 135 mmol/L (ref 135–145)

## 2022-12-18 LAB — CBC WITH DIFFERENTIAL/PLATELET
Abs Immature Granulocytes: 0.02 10*3/uL (ref 0.00–0.07)
Basophils Absolute: 0 10*3/uL (ref 0.0–0.1)
Basophils Relative: 0 %
Eosinophils Absolute: 0 10*3/uL (ref 0.0–0.5)
Eosinophils Relative: 0 %
HCT: 39.6 % (ref 36.0–46.0)
Hemoglobin: 12.6 g/dL (ref 12.0–15.0)
Immature Granulocytes: 0 %
Lymphocytes Relative: 19 %
Lymphs Abs: 1.2 10*3/uL (ref 0.7–4.0)
MCH: 27.3 pg (ref 26.0–34.0)
MCHC: 31.8 g/dL (ref 30.0–36.0)
MCV: 85.7 fL (ref 80.0–100.0)
Monocytes Absolute: 0.7 10*3/uL (ref 0.1–1.0)
Monocytes Relative: 10 %
Neutro Abs: 4.7 10*3/uL (ref 1.7–7.7)
Neutrophils Relative %: 71 %
Platelets: 337 10*3/uL (ref 150–400)
RBC: 4.62 MIL/uL (ref 3.87–5.11)
RDW: 14 % (ref 11.5–15.5)
WBC: 6.6 10*3/uL (ref 4.0–10.5)
nRBC: 0 % (ref 0.0–0.2)

## 2022-12-18 MED ORDER — MOMETASONE FURO-FORMOTEROL FUM 100-5 MCG/ACT IN AERO
2.0000 | INHALATION_SPRAY | Freq: Two times a day (BID) | RESPIRATORY_TRACT | Status: DC
  Administered 2022-12-18 – 2022-12-20 (×5): 2 via RESPIRATORY_TRACT
  Filled 2022-12-18: qty 8.8

## 2022-12-18 MED ORDER — IPRATROPIUM-ALBUTEROL 0.5-2.5 (3) MG/3ML IN SOLN
3.0000 mL | Freq: Three times a day (TID) | RESPIRATORY_TRACT | Status: DC
  Administered 2022-12-18 – 2022-12-20 (×8): 3 mL via RESPIRATORY_TRACT
  Filled 2022-12-18 (×7): qty 3

## 2022-12-18 NOTE — Evaluation (Signed)
Physical Therapy Evaluation Patient Details Name: Kaitlin Watkins MRN: [id number] DOB: May 27, 1946 Today's Date: 12/18/2022  History of Present Illness  Pt is a 77 y/o F presenting to ED on 6/10 with SOB, noted to have 103.2 fever in urgent care, CXR shows concern for multifocal PNA. PMH includes COPD, GERD, HTN,  OSA, and HLD.   Clinical Impression  Pt presented sitting upright in recliner chair, awake and willing to participate in therapy session. Prior to admission, pt reported that she was independent with all functional mobility and ADLs. She stated that she is the primary caregiver for her granddaughter who has MS and her great granddaughter (2 yrs old). She lives with her granddaughter and great granddaughter in an apartment with three flights of stairs to enter. At the time of evaluation, pt able to complete transfers with supervision and ambulate in the hallway with supervision without use of an AD. However, she did fatigue quickly and required one standing rest break. Pt on 2L of supplemental O2 via [place] upon PT arrival with SpO2 at >90% at rest. After ambulation on RA, pt SpO2 decreased to 86% with rapid recovery to >90% with sitting and reapplication of 2L of O2 via [place]. Pt would continue to benefit from skilled physical therapy services at this time while admitted and after d/c to address the below listed limitations in order to improve overall safety and independence with functional mobility.      Recommendations for follow up therapy are one component of a multi-disciplinary discharge planning process, led by the attending physician.  Recommendations may be updated based on patient status, additional functional criteria and insurance authorization.  Follow Up Recommendations       Assistance Recommended at Discharge Set up Supervision/Assistance  Patient can return home with the following       Equipment Recommendations None recommended by PT  Recommendations for Other Services        Functional Status Assessment Patient has had a recent decline in their functional status and demonstrates the ability to make significant improvements in function in a reasonable and predictable amount of time.     Precautions / Restrictions Precautions Precautions: Fall Restrictions Weight Bearing Restrictions: No      Mobility  Bed Mobility               General bed mobility comments: Pt OOB in recliner chair upon PT arrival    Transfers Overall transfer level: Needs assistance Equipment used: None Transfers: Sit to/from Stand Sit to Stand: Supervision           General transfer comment: no physical assistance needed    Ambulation/Gait Ambulation/Gait assistance: Supervision Gait Distance (Feet): 50 Feet Assistive device: None Gait Pattern/deviations: Step-through pattern, Decreased stride length Gait velocity: decreased     General Gait Details: pt with slow, steady gait without use of an AD with PT providing close supervision; pt requiring one standing rest break due to bout of coughing  Stairs            Wheelchair Mobility    Modified Rankin (Stroke Patients Only)       Balance Overall balance assessment: Needs assistance Sitting-balance support: Feet supported Sitting balance-Leahy Scale: Good     Standing balance support: During functional activity, No upper extremity supported Standing balance-Leahy Scale: Fair                               Pertinent Vitals/Pain  Pain Assessment Pain Assessment: No/denies pain Pain Intervention(s): Monitored during session    Home Living Family/patient expects to be discharged to:: Private residence Living Arrangements: Other (Comment) (granddaughter) Available Help at Discharge: Other (Comment) (takes care of her granddaughter who has MS) Type of Home: Apartment Home Access: Stairs to enter Entrance Stairs-Rails: Can reach both Entrance Stairs-Number of Steps: 3 flights    Home Layout: One level Home Equipment: Shower seat;Cane - single point;Rolling Environmental consultant (2 wheels) Additional Comments: plans to build mobile home and is moving in July, does not wear O2 at home    Prior Function Prior Level of Function : Independent/Modified Independent             Mobility Comments: no AD use ADLs Comments: ind     Hand Dominance   Dominant Hand: Right    Extremity/Trunk Assessment   Upper Extremity Assessment Upper Extremity Assessment: Defer to OT evaluation;Overall [facility] for tasks assessed    Lower Extremity Assessment Lower Extremity Assessment: Overall WFL for tasks assessed    Cervical / Trunk Assessment Cervical / Trunk Assessment: Normal  Communication   Communication: No difficulties  Cognition Arousal/Alertness: Awake/alert Behavior During Therapy: WFL for tasks assessed/performed Overall Cognitive Status: Within Functional Limits for tasks assessed                                          General Comments      Exercises     Assessment/Plan    PT Assessment Patient needs continued PT services  PT Problem List Cardiopulmonary status limiting activity;Decreased activity tolerance       PT Treatment Interventions DME instruction;Gait training;Stair training;Functional mobility training;Therapeutic activities;Therapeutic exercise;Balance training;Neuromuscular re-education;Patient/family education    PT Goals (Current goals can be found in the Care Plan section)  Acute Rehab PT Goals Patient Stated Goal: to manage her new dx of CHF and to return to caring for her granddaughter and great granddaughter PT Goal Formulation: With patient Time For Goal Achievement: 01/01/23 Potential to Achieve Goals: Good    Frequency Min 1X/week     Co-evaluation               AM-PAC PT "6 Clicks" Mobility  Outcome Measure Help needed turning from your back to your side while in a flat bed without using bedrails?:  None Help needed moving from lying on your back to sitting on the side of a flat bed without using bedrails?: None Help needed moving to and from a bed to a chair (including a wheelchair)?: None Help needed standing up from a chair using your arms (e.g., wheelchair or bedside chair)?: None Help needed to walk in hospital room?: A Little Help needed climbing 3-5 steps with a railing? : A Little 6 Click Score: 22    End of Session Equipment Utilized During Treatment: Oxygen Activity Tolerance: Patient limited by fatigue Patient left: in chair;with call bell/phone within reach Nurse Communication: Mobility status PT Visit Diagnosis: Other abnormalities of gait and mobility (R26.89)    Time: 6578-4696 PT Time Calculation (min) (ACUTE ONLY): 30 min   Charges:   PT Evaluation $PT Eval Moderate Complexity: 1 Mod PT Treatments $Gait Training: 8-22 mins        Arletta Bale, DPT  Acute Rehabilitation Services Office [phone]   Alessandra Bevels Kaylee Trivett 12/18/2022, 1:34 PM

## 2022-12-18 NOTE — Progress Notes (Signed)
Progress Note   Patient: Kaitlin Watkins:[id number] DOB: [birth year] DOA: 12/13/2022     4 DOS: the patient was seen and examined on 12/18/2022   Brief hospital course: Mrs. Ibsen was admitted to the hospital with the working diagnosis of COPD exacerbation in the setting of pneumonia.   [name] with history of chronic bronchitis, long-term passive smoking exposure, hypertension, OSA, GERD presented to the ED with worsening cough congestion and shortness of breath.  She reports ongoing chronic cough for few years, symptoms worsened in the last 2 weeks with dyspnea on exertion and productive cough, denied PND or orthopnea. Seen in urgent care where she was noted to have a temp of 103.2, x-ray noted multifocal pneumonia, sent to the ED. At the time of her ED evaluation her blood pressure was 120/70, HR 73, RR 28 and 02 saturation 99% on supplemental 02. Her lungs had rhonchi and rales, with decrease breath sounds, and wheezing, heart with S1 and S2 present and rhythmic, abdomen with no distention and positive lower extremity edema.   Na 141, K 3,7 Cl 104 bicarbonate 26, glucose 108, bun 15 cr 0,90  High sensitive troponin 10 and 9  Procalcitonin <0.10  Wbc 8,7 hgb 11.4 plt 217   Sars covid negative Influenza negative   Chest radiograph with hyperinflation, increased lung markings more dense at the left lower lobe.   CT chest with bilateral ground glass opacities, more dense at the left lower lobe and right upper lobe. Septal thickening.  No central pulmonary embolism.  Multiple borderline enlarged and mildly enlarged mediastinal and bilateral hilar lymph nodes.   EKG 77 bpm, normal axis, normal intervals, sinus rhythm with no significant ST segment or T wave changes.   Patient was placed on antibiotic therapy and systemic corticosteroids.  Echocardiogram with preserved LV systolic function.  06/15 slowly improving but continue to have cough and wheezing.   Assessment and Plan: *  Multifocal pneumonia COPD exacerbation.   Imaging suggestive interstitial lung disease. At home with second hand smoke exposure. 02 saturation today 96% on 2 L/min per [place].   Plan to continue antibiotic therapy with ceftriaxone and azithromycin. Continue bronchodilator therapy and systemic corticosteroids.  Add inhaled steroids.  Continue with supplemental 02 per [place] and check ambulatory 02 screen, she may need home 02 at the time of her discharge.  Airway clearing techniques with flutter valve and incentive spirometer.   Will need outpatient PFT and high resolution CT scan.   Acute on chronic diastolic CHF (congestive heart failure) (HCC) Echocardiogram with preserved LV systolic function 60 to 65%, no LVH, RV with mild dilatation, RVSP 33.7 mmHg. RA and LA with no dilatation. No significant valvular disease.   Continue blood pressure monitoring. Continue losartan for blood pressure control. Hold on further diuretic therapy.    Essential hypertension, benign Continue blood pressure control with losartan.   HLD (hyperlipidemia) Continue with statin therapy.   Obstructive sleep apnea treated with continuous positive airway pressure (CPAP) Patient not using CPAP at home.         Subjective: patient with persistent cough, and increased sputum production, not back to baseline.   Physical Exam: Vitals:   12/17/22 2102 12/17/22 2349 12/18/22 0502 12/18/22 0716  BP: 112/67 (!) 140/87 123/78 (!) 143/86  Pulse: 65 (!) 58 87 67  Resp: 18 18 18 18   Temp: 98.2 F (36.8 C) 97.9 F (36.6 C) 97.8 F (36.6 C) 97.8 F (36.6 C)  TempSrc: Oral Oral Oral Oral  SpO2:  97% 98% 99%   Weight:   80.3 kg   Height:       Neurology awake and alert ENT with mild pallor Cardiovascular with S1 and S2 present and rhythmic with no gallops or rubs No JVD No lower extremity edema Respiratory with rales, rhonchi and wheezing, diffuse and bilaterally.  Abdomen with no distention  Data  Reviewed:    Family Communication: no family at the bedside   Disposition: Status is: Inpatient Remains inpatient appropriate because: respiratory failure   Planned Discharge Destination: Home    Author: Coralie Keens, MD 12/18/2022 10:13 AM  For on call review [email or web address].

## 2022-12-18 NOTE — Evaluation (Signed)
Occupational Therapy Evaluation Patient Details Name: Kaitlin Watkins MRN: [id number] DOB: Jun 10, 1946 Today's Date: 12/18/2022   History of Present Illness P tis a 77 y/o F presenting to ED on 6/10 with SOB, noted to have 103.2 fever in urgent care, CXR shows concern for multifocal PNA. PMH includes GERD, HTN,  OSA, and HLD.   Clinical Impression   Pt reports independence at baseline with ADLs and functional mobility, lives with granddaughter who has MS and great-granddaughter, who she is caregiver for. Pt lives in 3rd floor apartment and does not wear O2 at baseline. Pt currently needing mod I - min guard A for ADLs, and supervision for transfers without RW. SpO2 down to 85% on RA with walking in room and standing grooming task, increased to 90's once 2L O2 donned and pt seated in chair. Encouraged IS use. Pt presenting with impairments listed below, will follow acutely. Anticipate no OT follow up needs at d/c.      Recommendations for follow up therapy are one component of a multi-disciplinary discharge planning process, led by the attending physician.  Recommendations may be updated based on patient status, additional functional criteria and insurance authorization.   Assistance Recommended at Discharge Set up Supervision/Assistance  Patient can return home with the following A little help with walking and/or transfers;A little help with bathing/dressing/bathroom;Assistance with cooking/housework;Assist for transportation;Help with stairs or ramp for entrance    Functional Status Assessment  Patient has had a recent decline in their functional status and demonstrates the ability to make significant improvements in function in a reasonable and predictable amount of time.  Equipment Recommendations  None recommended by OT (pt has all needed DME)    Recommendations for Other Services PT consult     Precautions / Restrictions Precautions Precaution Comments: watch O2,  vertigo Restrictions Weight Bearing Restrictions: No      Mobility Bed Mobility               General bed mobility comments: OOB in chair upon arrival and departure    Transfers Overall transfer level: Needs assistance Equipment used: None Transfers: Sit to/from Stand Sit to Stand: Supervision                  Balance Overall balance assessment: Mild deficits observed, not formally tested                                         ADL either performed or assessed with clinical judgement   ADL Overall ADL's : Needs assistance/impaired Eating/Feeding: Modified independent   Grooming: Modified independent   Upper Body Bathing: Min guard;Standing;Sitting   Lower Body Bathing: Min guard;Sitting/lateral leans;Sit to/from stand   Upper Body Dressing : Min guard;Sitting;Standing   Lower Body Dressing: Min guard;Sitting/lateral leans;Sit to/from stand   Toilet Transfer: Supervision/safety;Ambulation;Regular Toilet;Rolling walker (2 wheels)   Toileting- Clothing Manipulation and Hygiene: Supervision/safety       Functional mobility during ADLs: Supervision/safety       Vision   Vision Assessment?: No apparent visual deficits     Perception Perception Perception Tested?: No   Praxis Praxis Praxis tested?: Not tested    Pertinent Vitals/Pain Pain Assessment Pain Assessment: Faces Pain Score: 8  Faces Pain Scale: Hurts whole lot Pain Location: sore throat Pain Descriptors / Indicators: Discomfort Pain Intervention(s): Limited activity within patient's tolerance, Monitored during session, Repositioned     Hand Dominance  Right   Extremity/Trunk Assessment Upper Extremity Assessment Upper Extremity Assessment: Overall WFL for tasks assessed   Lower Extremity Assessment Lower Extremity Assessment: Defer to PT evaluation   Cervical / Trunk Assessment Cervical / Trunk Assessment: Normal   Communication  Communication Communication: No difficulties   Cognition Arousal/Alertness: Awake/alert Behavior During Therapy: WFL for tasks assessed/performed Overall Cognitive Status: Within Functional Limits for tasks assessed                                       General Comments  SpO2 down to 85% on RA with short distance mobility in room    Exercises     Shoulder Instructions      Home Living Family/patient expects to be discharged to:: Private residence Living Arrangements: Other (Comment) (granddaughter) Available Help at Discharge: Other (Comment) (takes care of her granddaughter who has MS) Type of Home: Apartment Home Access: Stairs to enter [employer] of Steps: 3 flights Entrance Stairs-Rails: Can reach both Home Layout: One level     Bathroom Shower/Tub: Tub/shower unit         Home Equipment: Shower seat;Cane - single point;Rolling Environmental consultant (2 wheels)   Additional Comments: plans to build mobile home and is moving in July, does not wear O2 at home      Prior Functioning/Environment Prior Level of Function : Independent/Modified Independent             Mobility Comments: no AD use ADLs Comments: ind        OT Problem List: Decreased strength;Decreased range of motion;Impaired balance (sitting and/or standing);Decreased activity tolerance;Cardiopulmonary status limiting activity      OT Treatment/Interventions: Therapeutic exercise;Self-care/ADL training;Energy conservation;DME and/or AE instruction;Therapeutic activities;Balance training;Patient/family education    OT Goals(Current goals can be found in the care plan section) Acute Rehab OT Goals Patient Stated Goal: none stated OT Goal Formulation: With patient Time For Goal Achievement: 01/01/23 Potential to Achieve Goals: Good ADL Goals Pt Will Perform Tub/Shower Transfer: Tub transfer;Shower transfer;Independently;ambulating;shower seat Additional ADL Goal #1: pt will  verbalize 3 energy conservation strategies in prep for ADLs Additional ADL Goal #2: Pt will be able to perform 3 consecutive tasks with SpO2 above 90%  OT Frequency: Min 1X/week    Co-evaluation              AM-PAC OT "6 Clicks" Daily Activity     Outcome Measure Help from another person eating meals?: None Help from another person taking care of personal grooming?: None Help from another person toileting, which includes using toliet, bedpan, or urinal?: A Little Help from another person bathing (including washing, rinsing, drying)?: A Little Help from another person to put on and taking off regular upper body clothing?: A Little Help from another person to put on and taking off regular lower body clothing?: A Little 6 Click Score: 20   End of Session Equipment Utilized During Treatment: Oxygen (2L) Nurse Communication: Mobility status  Activity Tolerance: Patient tolerated treatment well Patient left: in chair;with call bell/phone within reach  OT Visit Diagnosis: Unsteadiness on feet (R26.81);Other abnormalities of gait and mobility (R26.89);Muscle weakness (generalized) (M62.81);History of falling (Z91.81)                Time: 1610-9604 OT Time Calculation (min): 25 min Charges:  OT General Charges $OT Visit: 1 Visit OT Evaluation $OT Eval Low Complexity: 1 Low OT Treatments $Self Care/Home Management :  8-22 mins  Carver Fila, OTD, OTR/L SecureChat Preferred Acute Rehab [phone]   Dalphine Handing 12/18/2022, 8:35 AM

## 2022-12-19 DIAGNOSIS — J189 Pneumonia, unspecified organism: Secondary | ICD-10-CM | POA: Diagnosis not present

## 2022-12-19 DIAGNOSIS — I10 Essential (primary) hypertension: Secondary | ICD-10-CM | POA: Diagnosis not present

## 2022-12-19 DIAGNOSIS — E785 Hyperlipidemia, unspecified: Secondary | ICD-10-CM | POA: Diagnosis not present

## 2022-12-19 DIAGNOSIS — I5033 Acute on chronic diastolic (congestive) heart failure: Secondary | ICD-10-CM | POA: Diagnosis not present

## 2022-12-19 MED ORDER — HYDROCOD POLI-CHLORPHE POLI ER 10-8 MG/5ML PO SUER
5.0000 mL | Freq: Two times a day (BID) | ORAL | Status: DC
  Administered 2022-12-19 – 2022-12-20 (×3): 5 mL via ORAL
  Filled 2022-12-19 (×3): qty 5

## 2022-12-19 NOTE — Progress Notes (Signed)
Progress Note   Patient: Kaitlin Watkins:[id number] DOB: 1945/07/15 DOA: 12/13/2022     5 DOS: the patient was seen and examined on 12/19/2022   Brief hospital course: Kaitlin Watkins was admitted to the hospital with the working diagnosis of COPD exacerbation in the setting of pneumonia.   [name] with history of chronic bronchitis, long-term passive smoking exposure, hypertension, OSA, GERD presented to the ED with worsening cough congestion and shortness of breath.  She reports ongoing chronic cough for few years, symptoms worsened in the last 2 weeks with dyspnea on exertion and productive cough, denied PND or orthopnea. Seen in urgent care where she was noted to have a temp of 103.2, x-ray noted multifocal pneumonia, sent to the ED. At the time of her ED evaluation her blood pressure was 120/70, HR 73, RR 28 and 02 saturation 99% on supplemental 02. Her lungs had rhonchi and rales, with decrease breath sounds, and wheezing, heart with S1 and S2 present and rhythmic, abdomen with no distention and positive lower extremity edema.   Na 141, K 3,7 Cl 104 bicarbonate 26, glucose 108, bun 15 cr 0,90  High sensitive troponin 10 and 9  Procalcitonin <0.10  Wbc 8,7 hgb 11.4 plt 217   Sars covid negative Influenza negative   Chest radiograph with hyperinflation, increased lung markings more dense at the left lower lobe.   CT chest with bilateral ground glass opacities, more dense at the left lower lobe and right upper lobe. Septal thickening.  No central pulmonary embolism.  Multiple borderline enlarged and mildly enlarged mediastinal and bilateral hilar lymph nodes.   EKG 77 bpm, normal axis, normal intervals, sinus rhythm with no significant ST segment or T wave changes.   Patient was placed on antibiotic therapy and systemic corticosteroids.  Echocardiogram with preserved LV systolic function.  06/15 slowly improving but continue to have cough and wheezing.  06/16 patient had oxygen  desaturation on ambulation dow to 86% on room air. Continue to have cough and dyspnea.   Assessment and Plan: * Multifocal pneumonia COPD exacerbation.   Imaging suggestive interstitial lung disease. At home with second hand smoke exposure. 02 saturation today 100% on 2 L/min per [place].   Plan to continue antibiotic therapy with ceftriaxone and azithromycin. Continue bronchodilator therapy and systemic corticosteroids.  Now on inhaled corticosteroids.  Supplemental 02 per [place].  Airway clearing techniques with flutter valve and incentive spirometer.  Antitussive agents.  Will need outpatient PFT and high resolution CT scan.   Acute on chronic diastolic CHF (congestive heart failure) (HCC) Echocardiogram with preserved LV systolic function 60 to 65%, no LVH, RV with mild dilatation, RVSP 33.7 mmHg. RA and LA with no dilatation. No significant valvular disease.   Continue blood pressure monitoring. Continue losartan for blood pressure control. Hold on further diuretic therapy.    Essential hypertension, benign Continue blood pressure control with losartan.   HLD (hyperlipidemia) Continue with statin therapy.   Obstructive sleep apnea treated with continuous positive airway pressure (CPAP) Patient not using CPAP at home.         Subjective: Dyspnea slowly improving but not yet back to baseline continue to have cough and congestion   Physical Exam: Vitals:   12/18/22 1924 12/19/22 0122 12/19/22 0434 12/19/22 0840  BP: 120/61 (!) 156/73 (!) 141/82 110/68  Pulse:  61 (!) 59 94  Resp:  20 20 20   Temp: 97.7 F (36.5 C) 98 F (36.7 C) 98 F (36.7 C) 98.1 F (36.7 C)  TempSrc: Oral Oral Oral Oral  SpO2: 96% 98% 96% 100%  Weight:   80.7 kg   Height:       Neurology awake and alert ENT with mild pallor Cardiovascular with S1 and S2 present and rhythmic with no gallops, rubs or murmurs No JVD No lower extremity edema Respiratory with rales, rhonchi and wheezing,  bilaterally  Abdomen with no distention  Data Reviewed:    Family Communication: no family at the bedside   Disposition: Status is: Inpatient Remains inpatient appropriate because: respiratory failure   Planned Discharge Destination: Home      Author: Coralie Keens, MD 12/19/2022 10:27 AM  For on call review [email or web address].

## 2022-12-20 DIAGNOSIS — J189 Pneumonia, unspecified organism: Secondary | ICD-10-CM | POA: Diagnosis not present

## 2022-12-20 LAB — BASIC METABOLIC PANEL
Anion gap: 13 (ref 5–15)
BUN: 17 mg/dL (ref 8–23)
CO2: 28 mmol/L (ref 22–32)
Calcium: 9.1 mg/dL (ref 8.9–10.3)
Chloride: 97 mmol/L — ABNORMAL LOW (ref 98–111)
Creatinine, Ser: 0.79 mg/dL (ref 0.44–1.00)
GFR, Estimated: >60 mL/min (ref >60)
Glucose, Bld: 113 mg/dL — ABNORMAL HIGH (ref 70–99)
Potassium: 4.4 mmol/L (ref 3.5–5.1)
Sodium: 138 mmol/L (ref 135–145)

## 2022-12-20 MED ORDER — LOSARTAN POTASSIUM 50 MG PO TABS
50.0000 mg | ORAL_TABLET | Freq: Every day | ORAL | 2 refills | Status: DC
Start: 2022-12-20 — End: 2023-05-13

## 2022-12-20 MED ORDER — HYDROCOD POLI-CHLORPHE POLI ER 10-8 MG/5ML PO SUER: 5.0000 mL | Freq: Two times a day (BID) | ORAL | 0 refills | Status: DC

## 2022-12-20 NOTE — Progress Notes (Signed)
Mobility Specialist Progress Note:   12/20/22 1026  Mobility  Activity Ambulated independently in room;Ambulated with assistance in hallway  Level of Assistance Contact guard assist, steadying assist  Assistive Device None  Distance Ambulated (ft) 120 ft  Activity Response Tolerated fair  Mobility Referral Yes  $Mobility charge 1 Mobility  Mobility Specialist Start Time (ACUTE ONLY) 1027  Mobility Specialist Stop Time (ACUTE ONLY) 1043  Mobility Specialist Time Calculation (min) (ACUTE ONLY) 16 min    Pre Mobility: 120/86 BP,  92% SpO2 RA During Mobility:  94% SpO2 RA Post Mobility:  145/91 BP,  98% SpO2 RA  Pt received in chair, agreeable to mobility. Required x2 standing rest breaks during ambulation due to LOB x1 and displays of minor SOB. C/o mild BLE fatigue. Experienced brief coughing spell once returned to chair. Pt left in chair with call bell and all needs met.  [place] Earlene Plater Mobility Specialist Please contact via Special educational needs teacher or Rehab office at [phone]

## 2022-12-20 NOTE — Care Management Important Message (Signed)
Important Message  Patient Details  Name: Kaitlin Watkins MRN: [id number] Date of Birth: Oct 30, 1945   Medicare Important Message Given:  Yes     Sherilyn Banker 12/20/2022, 4:34 PM

## 2022-12-20 NOTE — Progress Notes (Signed)
Nurse requested Mobility Specialist to perform oxygen saturation test with pt which includes removing pt from oxygen both at rest and while ambulating.  Below are the results from that testing.     Patient Saturations on Room Air at Rest = spO2 96%  Patient Saturations on Room Air while Ambulating = sp02 92% .    At end of testing pt left in room on RA    Reported results to nurse.

## 2022-12-20 NOTE — Discharge Summary (Signed)
Physician Discharge Summary   Patient: Kaitlin Watkins MRN: [id number] DOB: Dec 07, 1945  Admit date:     12/13/2022  Discharge date: 12/20/22  Discharge Physician: Jacquelin Hawking, MD   PCP: Pcp, No   Recommendations at discharge:  Establish care with a PCP Pulmonology follow-up (referral placed) for PFTs and follow-up CT imaging  Discharge Diagnoses: Principal Problem:   Multifocal pneumonia Active Problems:   Acute on chronic diastolic CHF (congestive heart failure) (HCC)   Essential hypertension, benign   HLD (hyperlipidemia)   Obstructive sleep apnea treated with continuous positive airway pressure (CPAP)  Resolved Problems:   * No resolved hospital problems. *  Hospital Course:  Kaitlin Watkins is a 77 y.o. female with a history of chronic bronchitis, secondhand smoke exposure, hypertension, OSA, GERD, hyperlipidemia.  Patient presented secondary to dyspnea on exertion with associated productive cough and was found to have evidence of severe multilobar pneumonia.  Patient started on empiric treatment with ceftriaxone azithromycin with improvement of symptoms.  Patient also noted to have a possible associated COPD exacerbation, not fully diagnosed, which responded well to treatment of pneumonia and steroids.  Recommendation for for patient to establish with a PCP and to follow-up with pulmonology as an outpatient.  Assessment and Plan:  Multifocal pneumonia Present on admission.  Patient noted to have severe multiple Monia.  Patient started on empiric ceftriaxone and azithromycin with improvement in symptoms.  Patient completed antibiotic therapy prior to discharge.  Possible COPD exacerbation Formal diagnosis of COPD.  Patient was treated with Symbicort and steroids in addition to albuterol for treatment.  Patient with improvement prior to discharge.  Patient discharged with incentive spirometer and flutter valve as well.  Recommendation for patient to follow-up with neurology  for outpatient pulmonary function testing.  Abnormal CT scan of the lung Concern for possible underlying interstitial lung disease.  Recommendation for high-resolution CT scan as an outpatient in 3 months.  Referral placed for pulmonology as an outpatient.  CT scan was also concerning for evaluation of pulmonary trunk concerning for possible pulmonary hypertension; echocardiogram also consistent with mild right ventricular enlargement with normal function.  Acute on chronic diastolic heart failure Patient in mild/minimal.  Patient managed with IV diuretic therapy which was discontinued prior to discharge.  Primary hypertension Continue losartan.  Hyperlipidemia Continue Lipitor   Consultants: None Procedures performed: 6/11: Transthoracic Echocardiogram   Disposition: Home Diet recommendation: Heart healthy/regular   DISCHARGE MEDICATION: Allergies as of 12/20/2022       Reactions   Influenza A (h1n1) [place] Vac Anaphylaxis   Prolia [denosumab] Rash   Blistering rash around injection site   Egg-derived Products Other (See Comments)   Patient can eat eggs but cannot have a flu shot Dr. Alberteen Sam pcp gave her flu shot 2019 (did not have reaction).   Influenza Vaccines Other (See Comments)   Per her allergist she is not to take one.        Medication List     TAKE these medications    acetaminophen 500 MG tablet Commonly known as: TYLENOL Take 500 mg by mouth every 6 (six) hours as needed for headache.   albuterol (2.5 MG/3ML) 0.083% nebulizer solution Commonly known as: PROVENTIL Take 2.5 mg by nebulization every 6 (six) hours as needed for wheezing or shortness of breath.   aspirin EC 81 MG tablet Take 81 mg by mouth at bedtime.   atorvastatin 80 MG tablet Commonly known as: LIPITOR Take 80 mg by mouth every evening.  ezetimibe 10 MG tablet Commonly known as: ZETIA Take 10 mg by mouth daily.   ibuprofen 200 MG tablet Commonly known as: ADVIL Take 200 mg by  mouth every 6 (six) hours as needed for headache.   losartan 50 MG tablet Commonly known as: COZAAR Take 1 tablet (50 mg total) by mouth daily.   REFRESH OP Place 1 drop into both eyes 2 (two) times daily as needed (dry eyes).        Follow-up Information     [facility]. Go on 12/30/2022.   Why: @1 :15pm Contact information: [address] [place] [address] [phone]               Discharge Exam: BP 120/86 (BP Location: Right Arm)   Pulse 76   Temp 97.9 F (36.6 C) (Oral)   Resp 20   Ht 5\' 5"  (1.651 m)   Wt 81 kg   SpO2 95%   BMI 29.72 kg/m   General exam: Appears calm and comfortable Respiratory system: Right sided coarse breath sounds without wheezing or rales. Respiratory effort normal. Cardiovascular system: S1 & S2 heard, RRR. No murmurs. Gastrointestinal system: Abdomen is nondistended, soft and nontender. Normal bowel sounds heard. Central nervous system: Alert and oriented. No focal neurological deficits. Musculoskeletal: No edema. No calf tenderness Skin: No cyanosis. No rashes Psychiatry: Judgement and insight appear normal. Mood & affect appropriate.   Condition at discharge: stable  The results of significant diagnostics from this hospitalization (including imaging, microbiology, ancillary and laboratory) are listed below for reference.   Imaging Studies: ECHOCARDIOGRAM COMPLETE  Result Date: 12/14/2022    ECHOCARDIOGRAM REPORT   Patient Name:   Kaitlin Watkins Date of Exam: 12/14/2022 Medical Rec #:  [id number]         Height:       65.0 in Accession #:    [id number]        Weight:       175.0 lb Date of Birth:  [birth year]          BSA:          1.869 m Patient Age:    77 years          BP:           114/70 mmHg Patient Gender: F                 HR:           75 bpm. Exam Location:  Inpatient Procedure: 2D Echo, Color Doppler and Cardiac Doppler Indications:    acute systolic chf  History:        Patient  has prior history of Echocardiogram examinations, most                 recent 04/29/2016. Signs/Symptoms:Dyspnea; Risk                 Factors:Hypertension, Dyslipidemia and Sleep Apnea.  Sonographer:    Delcie Roch RDCS Referring Phys: [id number] MICHAEL E NORINS IMPRESSIONS  1. Left ventricular ejection fraction, by estimation, is 60 to 65%. The left ventricle has normal function. The left ventricle has no regional wall motion abnormalities. Left ventricular diastolic parameters are consistent with Grade I diastolic dysfunction (impaired relaxation).  2. Right ventricular systolic function is normal. The right ventricular size is mildly enlarged. There is normal pulmonary artery systolic pressure. The estimated right ventricular systolic pressure is 33.7 mmHg.  3. The mitral valve is grossly  normal. Trivial mitral valve regurgitation. No evidence of mitral stenosis.  4. The aortic valve is tricuspid. Aortic valve regurgitation is not visualized. Aortic valve sclerosis is present, with no evidence of aortic valve stenosis.  5. The inferior vena cava is normal in size with greater than 50% respiratory variability, suggesting right atrial pressure of 3 mmHg. FINDINGS  Left Ventricle: Left ventricular ejection fraction, by estimation, is 60 to 65%. The left ventricle has normal function. The left ventricle has no regional wall motion abnormalities. The left ventricular internal cavity size was normal in size. There is  no left ventricular hypertrophy. Left ventricular diastolic parameters are consistent with Grade I diastolic dysfunction (impaired relaxation). Right Ventricle: The right ventricular size is mildly enlarged. No increase in right ventricular wall thickness. Right ventricular systolic function is normal. There is normal pulmonary artery systolic pressure. The tricuspid regurgitant velocity is 2.77  m/s, and with an assumed right atrial pressure of 3 mmHg, the estimated right ventricular systolic pressure  is 33.7 mmHg. Left Atrium: Left atrial size was normal in size. Right Atrium: Right atrial size was normal in size. Pericardium: There is no evidence of pericardial effusion. Mitral Valve: The mitral valve is grossly normal. Trivial mitral valve regurgitation. No evidence of mitral valve stenosis. Tricuspid Valve: The tricuspid valve is grossly normal. Tricuspid valve regurgitation is mild . No evidence of tricuspid stenosis. Aortic Valve: The aortic valve is tricuspid. Aortic valve regurgitation is not visualized. Aortic valve sclerosis is present, with no evidence of aortic valve stenosis. Pulmonic Valve: The pulmonic valve was grossly normal. Pulmonic valve regurgitation is not visualized. No evidence of pulmonic stenosis. Aorta: The aortic root and ascending aorta are structurally normal, with no evidence of dilitation. Venous: The inferior vena cava is normal in size with greater than 50% respiratory variability, suggesting right atrial pressure of 3 mmHg. IAS/Shunts: The atrial septum is grossly normal.  LEFT VENTRICLE PLAX 2D LVIDd:         5.10 cm   Diastology LVIDs:         3.20 cm   LV e' medial:    6.09 cm/s LV PW:         1.00 cm   LV E/e' medial:  10.4 LV IVS:        1.00 cm   LV e' lateral:   7.40 cm/s LVOT diam:     1.80 cm   LV E/e' lateral: 8.6 LV SV:         63 LV SV Index:   33 LVOT Area:     2.54 cm  RIGHT VENTRICLE             IVC RV Basal diam:  2.60 cm     IVC diam: 1.90 cm RV S prime:     18.80 cm/s TAPSE (M-mode): 3.5 cm LEFT ATRIUM             Index        RIGHT ATRIUM           Index LA diam:        3.30 cm 1.77 cm/m   RA Area:     13.00 cm LA Vol (A2C):   42.1 ml 22.53 ml/m  RA Volume:   24.60 ml  13.16 ml/m LA Vol (A4C):   47.5 ml 25.41 ml/m LA Biplane Vol: 44.9 ml 24.02 ml/m  AORTIC VALVE LVOT Vmax:   119.00 cm/s LVOT Vmean:  79.800 cm/s LVOT VTI:    0.246 m  AORTA Ao Root diam: 3.10 cm Ao Asc diam:  3.30 cm MITRAL VALVE               TRICUSPID VALVE MV Area (PHT): 3.91 cm    TR  Peak grad:   30.7 mmHg MV Decel Time: 194 msec    TR Vmax:        277.00 cm/s MV E velocity: 63.60 cm/s MV A velocity: 85.90 cm/s  SHUNTS MV E/A ratio:  0.74        Systemic VTI:  0.25 m                            Systemic Diam: 1.80 cm Lennie Odor MD Electronically signed by Lennie Odor MD Signature Date/Time: 12/14/2022/2:43:54 PM    Final    CT Angio Chest PE W and/or Wo Contrast  Result Date: 12/14/2022 CLINICAL DATA:  77 year old female with history of shortness of breath. Nasal drainage for several months. Worsening cough. EXAM: CT ANGIOGRAPHY CHEST WITH CONTRAST TECHNIQUE: Multidetector CT imaging of the chest was performed using the standard protocol during bolus administration of intravenous contrast. Multiplanar CT image reconstructions and MIPs were obtained to evaluate the vascular anatomy. RADIATION DOSE REDUCTION: This exam was performed according to the departmental dose-optimization program which includes automated exposure control, adjustment of the mA and/or kV according to patient size and/or use of iterative reconstruction technique. CONTRAST:  75mL OMNIPAQUE IOHEXOL 350 MG/ML SOLN COMPARISON:  CTA chest 04/29/2016. FINDINGS: Cardiovascular: There are no central, lobar or proximal segmental sized filling defects to suggest pulmonary embolism. Distal segmental and subsegmental sized filling defects can not be completely excluded secondary to extensive respiratory motion on today's examination. Dilatation of the pulmonic trunk (3.5 cm in diameter) and right main pulmonary artery (3.6 cm in diameter), which may suggest pulmonary arterial hypertension. Heart size is enlarged with mild left atrial dilatation. There is no significant pericardial fluid, thickening or pericardial calcification. There is aortic atherosclerosis, as well as atherosclerosis of the great vessels of the mediastinum and the coronary arteries, including calcified atherosclerotic plaque in the left main, left anterior  descending and left circumflex coronary arteries. Mediastinum/Nodes: Multiple prominent borderline enlarged and mildly enlarged mediastinal and bilateral hilar lymph nodes measuring up to 1.5 cm in the left hilar nodal station. Esophagus is unremarkable in appearance. No axillary lymphadenopathy. Lungs/Pleura: Widespread but patchy areas of ground-glass attenuation, septal thickening and frank airspace consolidation are scattered throughout the lungs bilaterally, most confluent in the left lower lobe, indicative of severe multilobar bronchopneumonia. No pleural effusions. No definite suspicious appearing pulmonary nodules or masses are noted on today's motion limited examination. Upper Abdomen: Unremarkable. Musculoskeletal: There are no aggressive appearing lytic or blastic lesions noted in the visualized portions of the skeleton. Review of the MIP images confirms the above findings. IMPRESSION: 1. Motion limited study demonstrating no definite central, lobar or proximal segmental sized pulmonary embolism. 2. The appearance of the lungs is most compatible with severe multilobar bilateral bronchopneumonia. The possibility of some degree of underlying interstitial lung disease is not entirely excluded. Follow-up high-resolution chest CT should be considered in 3 months after complete resolution of the patient's acute illness to assess for areas of non reversible parenchymal lung disease. 3. Multiple borderline enlarged and mildly enlarged mediastinal and bilateral hilar lymph nodes could be reactive, or could be associated with underlying interstitial lung disease. Attention on follow-up imaging is recommended to ensure stability or regression. 4. Cardiomegaly with  left atrial dilatation. 5. Dilatation of the pulmonic trunk and right main pulmonary artery, which may suggest underlying pulmonary arterial hypertension. 6. Aortic atherosclerosis, in addition to left main and 2 vessel coronary artery disease. Assessment  for potential risk factor modification, dietary therapy or pharmacologic therapy may be warranted, if clinically indicated. Aortic Atherosclerosis (ICD10-I70.0). Electronically Signed   By: Trudie Reed M.D.   On: 12/14/2022 06:32   DG Chest 2 View  Result Date: 12/13/2022 CLINICAL DATA:  Cough. EXAM: CHEST - 2 VIEW COMPARISON:  Chest CT 04/29/2016, radiograph 12/27/2014 FINDINGS: Heart size upper normal. Interstitial and bronchial thickening, with more patchy opacities at the lung bases. No pleural fluid or pneumothorax. No acute osseous abnormalities are seen. IMPRESSION: 1. Interstitial and bronchial thickening, with more patchy opacities at the lung bases. Findings likely represent atypical infection or pulmonary edema superimposed on chronic lung disease. 2. Upper normal heart size. Electronically Signed   By: Narda Rutherford M.D.   On: 12/13/2022 23:47    Microbiology: Results for orders placed or performed during the hospital encounter of 12/13/22  Group A Strep by PCR     Status: None   Collection Time: 12/13/22 11:21 PM   Specimen: Throat; Sterile Swab  Result Value Ref Range Status   Group A Strep by PCR NOT DETECTED NOT DETECTED Final    Comment: Performed at [facility], [address] [place], [place] [address]  Resp panel by RT-PCR (RSV, Flu A&B, Covid) Anterior Nasal Swab     Status: None   Collection Time: 12/14/22  3:28 AM   Specimen: Anterior Nasal Swab  Result Value Ref Range Status   SARS Coronavirus 2 by RT PCR NEGATIVE NEGATIVE Final   Influenza A by PCR NEGATIVE NEGATIVE Final   Influenza B by PCR NEGATIVE NEGATIVE Final    Comment: (NOTE) The Xpert Xpress SARS-CoV-2/FLU/RSV plus assay is intended as an aid in the diagnosis of influenza from Nasopharyngeal swab specimens and should not be used as a sole basis for treatment. Nasal washings and aspirates are unacceptable for Xpert Xpress SARS-CoV-2/FLU/RSV testing.  Fact Sheet for  Patients: BloggerCourse.com  Fact Sheet for Healthcare Providers: SeriousBroker.it  This test is not yet approved or cleared by the [place] FDA and has been authorized for detection and/or diagnosis of SARS-CoV-2 by FDA under an Emergency Use Authorization (EUA). This EUA will remain in effect (meaning this test can be used) for the duration of the COVID-19 declaration under Section 564(b)(1) of the Act, 21 U.S.C. section 360bbb-3(b)(1), unless the authorization is terminated or revoked.     Resp Syncytial Virus by PCR NEGATIVE NEGATIVE Final    Comment: (NOTE) Fact Sheet for Patients: BloggerCourse.com  Fact Sheet for Healthcare Providers: SeriousBroker.it  This test is not yet approved or cleared by the [place] FDA and has been authorized for detection and/or diagnosis of SARS-CoV-2 by FDA under an Emergency Use Authorization (EUA). This EUA will remain in effect (meaning this test can be used) for the duration of the COVID-19 declaration under Section 564(b)(1) of the Act, 21 U.S.C. section 360bbb-3(b)(1), unless the authorization is terminated or revoked.  Performed at [facility], [address], [place], [place] [address]     Labs: CBC: Recent Labs  Lab 12/14/22 0355 12/14/22 0754 12/14/22 1656 12/15/22 0015 12/16/22 0015 12/17/22 0159 12/18/22 0043  WBC 8.8  --  8.7 9.0 7.1 8.6 6.6  NEUTROABS 6.5  --   --   --   --   --  4.7  HGB 13.0   < > 11.4* 12.1 13.2 14.0 12.6  HCT 40.6   < > 36.0 37.9 40.6 43.7 39.6  MCV 88.6  --  86.5 85.4 84.6 84.7 85.7  PLT 234  --  217 237 294 380 337   < > = values in this interval not displayed.   Basic Metabolic Panel: Recent Labs  Lab 12/14/22 0355 12/14/22 0754 12/15/22 0015 12/16/22 0015 12/17/22 0159 12/18/22 0043 12/20/22 0115  NA 140   < > 137 135 134* 135 138  K 3.7   < > 3.7 4.1 4.2 4.5 4.4   CL 101   < > 102 95* 95* 96* 97*  CO2 26  --  26 29 27 29 28   GLUCOSE 117*   < > 110* 143* 114* 99 113*  BUN 14   < > 20 28* 35* 28* 17  CREATININE 0.93   < > 1.06* 0.94 1.09* 0.78 0.79  CALCIUM 9.0  --  8.4* 8.8* 9.1 8.7* 9.1  MG 2.1  --   --   --   --   --   --    < > = values in this interval not displayed.   Liver Function Tests: Recent Labs  Lab 12/14/22 0355  AST 22  ALT 13  ALKPHOS 78  BILITOT 0.8  PROT 6.8  ALBUMIN 3.2*    Discharge time spent: 35 minutes.  Signed: Jacquelin Hawking, MD [facility] 12/20/2022

## 2022-12-20 NOTE — Discharge Instructions (Signed)
Kaitlin Watkins,  You are in the hospital because of pneumonia which was treated with IV antibiotics.  You may also have possible lung disease and have improved with treatment of pneumonia in addition to being given some steroids and breathing treatments.  I recommend that you follow-up with your primary care physician but also that you follow-up with the pulmonologist as you may need repeat lung imaging as an outpatient.

## 2022-12-20 NOTE — TOC Transition Note (Signed)
Transition of Care [facility]) - CM/SW Discharge Note   Patient Details  Name: Kaitlin Watkins MRN: [id number] Date of Birth: 05/20/1946  Transition of Care [facility]) CM/SW Contact:  Leone Haven, RN Phone Number: 12/20/2022, 2:07 PM   Clinical Narrative:    Per pt eval rec HHPT,  NCM offered choice, she states she does not have a preferenc, NCM made referral to [facility] with [facility].  She is able to take referral.  Soc will begin 24 to 48 hrs post dc.  Her SIL will transport her home.   Final next level of care: Home w Home Health Services Barriers to Discharge: No Barriers Identified   Patient Goals and CMS Choice CMS Medicare.gov Compare Post Acute Care list provided to:: Patient Choice offered to / list presented to : Patient  Discharge Placement                         Discharge Plan and Services Additional resources added to the After Visit Summary for   In-house Referral: NA, [facility] Discharge Planning Services: CM Consult Post Acute Care Choice: NA          DME Arranged: N/A DME Agency: NA       HH Arranged: PT HH Agency: [facility] Date HH Agency Contacted: 12/20/22 Time HH Agency Contacted: 1407 Representative spoke with at [facility] Agency: Tresa Endo  Social Determinants of Health (SDOH) Interventions SDOH Screenings   Food Insecurity: No Food Insecurity (12/14/2022)  Housing: Low Risk  (12/14/2022)  Transportation Needs: No Transportation Needs (12/14/2022)  Utilities: Not At Risk (12/14/2022)  Tobacco Use: Medium Risk (12/14/2022)     Readmission Risk Interventions     No data to display

## 2022-12-20 NOTE — Plan of Care (Signed)

## 2022-12-30 ENCOUNTER — Ambulatory Visit: Payer: Medicare Other | Admitting: Student

## 2023-01-04 ENCOUNTER — Encounter: Payer: Self-pay | Admitting: Family Medicine

## 2023-01-04 ENCOUNTER — Ambulatory Visit (INDEPENDENT_AMBULATORY_CARE_PROVIDER_SITE_OTHER): Payer: Medicare Other | Admitting: Family Medicine

## 2023-01-04 VITALS — BP 110/70 | HR 75 | Temp 98.2°F | Ht 65.0 in | Wt 177.2 lb

## 2023-01-04 DIAGNOSIS — R918 Other nonspecific abnormal finding of lung field: Secondary | ICD-10-CM | POA: Diagnosis not present

## 2023-01-04 DIAGNOSIS — I10 Essential (primary) hypertension: Secondary | ICD-10-CM

## 2023-01-04 DIAGNOSIS — Z1159 Encounter for screening for other viral diseases: Secondary | ICD-10-CM

## 2023-01-04 DIAGNOSIS — Z130 Encounter for screening for diseases of the blood and blood-forming organs and certain disorders involving the immune mechanism: Secondary | ICD-10-CM

## 2023-01-04 DIAGNOSIS — E559 Vitamin D deficiency, unspecified: Secondary | ICD-10-CM

## 2023-01-04 DIAGNOSIS — Z7689 Persons encountering health services in other specified circumstances: Secondary | ICD-10-CM

## 2023-01-04 DIAGNOSIS — E785 Hyperlipidemia, unspecified: Secondary | ICD-10-CM

## 2023-01-04 DIAGNOSIS — E538 Deficiency of other specified B group vitamins: Secondary | ICD-10-CM

## 2023-01-04 NOTE — Assessment & Plan Note (Signed)
Patient is not taking any medication for her hyperlipidemia. Ordered lipid panel. Patient is not fasting and will make an appointment to come when fasting.
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
## 2023-01-04 NOTE — Assessment & Plan Note (Signed)
Blood pressure is stable. Continue Losartan 50mg  daily. Ordered CMP to assess kidney function. Patient reports she restarted this medication when she was admitted to [facility] in June.

## 2023-01-04 NOTE — Patient Instructions (Addendum)
-  It was a pleasure to meet you and look forward to taking care of you.  -Ordered screening labs. Needs to make a lab visit when fasting.  -Continue taking current medications.  -Please call the following to make an appointment with pulmonary. When scheduling appointment, you may want to ask if they can see you at [facility] location. It may be faster for an appointment.  [facility] Pulmonary Care at [facility] [address], [place], [place] [address] [phone] -Follow up based on lab results.

## 2023-01-04 NOTE — Assessment & Plan Note (Signed)
Please call the following to make an appointment with pulmonary. When scheduling appointment, you may want to ask if they can see you at [facility] location. It may be faster for an appointment.  [facility] Pulmonary Care at [facility] [address], [place], [place] [address] [phone]

## 2023-01-05 ENCOUNTER — Other Ambulatory Visit: Payer: Self-pay

## 2023-01-05 ENCOUNTER — Telehealth: Payer: Self-pay

## 2023-01-05 ENCOUNTER — Other Ambulatory Visit (INDEPENDENT_AMBULATORY_CARE_PROVIDER_SITE_OTHER): Payer: Medicare Other

## 2023-01-05 ENCOUNTER — Ambulatory Visit: Payer: Medicare Other

## 2023-01-05 DIAGNOSIS — E559 Vitamin D deficiency, unspecified: Secondary | ICD-10-CM | POA: Diagnosis not present

## 2023-01-05 DIAGNOSIS — E785 Hyperlipidemia, unspecified: Secondary | ICD-10-CM

## 2023-01-05 DIAGNOSIS — Z1329 Encounter for screening for other suspected endocrine disorder: Secondary | ICD-10-CM | POA: Diagnosis not present

## 2023-01-05 DIAGNOSIS — Z130 Encounter for screening for diseases of the blood and blood-forming organs and certain disorders involving the immune mechanism: Secondary | ICD-10-CM | POA: Diagnosis not present

## 2023-01-05 DIAGNOSIS — I10 Essential (primary) hypertension: Secondary | ICD-10-CM

## 2023-01-05 DIAGNOSIS — Z1159 Encounter for screening for other viral diseases: Secondary | ICD-10-CM

## 2023-01-05 DIAGNOSIS — E538 Deficiency of other specified B group vitamins: Secondary | ICD-10-CM

## 2023-01-05 DIAGNOSIS — Z13228 Encounter for screening for other metabolic disorders: Secondary | ICD-10-CM | POA: Diagnosis not present

## 2023-01-05 LAB — COMPREHENSIVE METABOLIC PANEL
ALT: 12 U/L (ref 0–35)
AST: 20 U/L (ref 0–37)
Albumin: 3.7 g/dL (ref 3.5–5.2)
Alkaline Phosphatase: 82 U/L (ref 39–117)
BUN: 13 mg/dL (ref 6–23)
CO2: 28 mEq/L (ref 19–32)
Calcium: 9.4 mg/dL (ref 8.4–10.5)
Chloride: 104 mEq/L (ref 96–112)
Creatinine, Ser: 0.71 mg/dL (ref 0.40–1.20)
GFR: 81.2 mL/min (ref >60.00)
Glucose, Bld: 87 mg/dL (ref 70–99)
Potassium: 4 mEq/L (ref 3.5–5.1)
Sodium: 139 mEq/L (ref 135–145)
Total Bilirubin: 0.5 mg/dL (ref 0.2–1.2)
Total Protein: 6.9 g/dL (ref 6.0–8.3)

## 2023-01-05 LAB — TSH: TSH: 1.52 u[IU]/mL (ref 0.35–5.50)

## 2023-01-05 LAB — LIPID PANEL
Cholesterol: 237 mg/dL — ABNORMAL HIGH (ref 0–200)
HDL: 44.2 mg/dL (ref >39.00)
LDL Cholesterol: 177 mg/dL — ABNORMAL HIGH (ref 0–99)
NonHDL: 192.31
Total CHOL/HDL Ratio: 5
Triglycerides: 79 mg/dL (ref 0.0–149.0)
VLDL: 15.8 mg/dL (ref 0.0–40.0)

## 2023-01-05 LAB — VITAMIN B12: Vitamin B-12: 216 pg/mL (ref 211–911)

## 2023-01-05 LAB — VITAMIN D 25 HYDROXY (VIT D DEFICIENCY, FRACTURES): VITD: 26.12 ng/mL — ABNORMAL LOW (ref 30.00–100.00)

## 2023-01-05 LAB — HEMOGLOBIN A1C: Hgb A1c MFr Bld: 6.2 % (ref 4.6–6.5)

## 2023-01-05 NOTE — Telephone Encounter (Signed)
Left pt a vm to call office in regards to lab results . Repeat liver function is order is in just need a 6 week lab only visit .

## 2023-01-05 NOTE — Telephone Encounter (Signed)
-----   Message from Alveria Apley, NP sent at 01/05/2023  3:41 PM EDT ----- Your vitamin D is low, recommend over the counter vitamin D3 2,000IU daily with a recheck in vitamin D in 3 months. Your total cholesterol and bad (LDL) cholesterol are elevated. Your 10-year ASCVD risk score is: 21%, which means you are at a high risk of having a cardiovascular event, such as a stroke or heart attack. Recommend to restart Atorvastatin 80mg  daily (dx: Hyperlipidemia). Recommend diet changes increase intake of fresh fruits and vegetables, increase intake of lean proteins. Bake, broil, or grill foods. Avoid fried, greasy, and fatty foods. Avoid fast foods. Increase intake of fiber-rich whole grains. Exercise encouraged - at least 150 minutes per week and advance as tolerated. If you decide to restart Atrovastatin, recommend a hepatic function lab (checks liver) with starting medication in 6 weeks. (Dx Hyperlipidemia) Then, lets make a follow up appointment for 3 months and be fasting at that appointment. A1c is elevated at 6.2, which is prediabetes. Recommend to decrease carbohydrates and sweets. Vitamin B12 is on the low normal side, recommend over the counter vitamin b12 1,029mcg daily.

## 2023-01-06 LAB — HEPATITIS C ANTIBODY: Hepatitis C Ab: NONREACTIVE

## 2023-01-07 ENCOUNTER — Telehealth: Payer: Self-pay

## 2023-01-07 NOTE — Telephone Encounter (Signed)
Hepatitis C is also non reactive

## 2023-01-07 NOTE — Telephone Encounter (Signed)
-----   Message from Alveria Apley, NP sent at 01/07/2023  8:58 AM EDT ----- Hep C is non reactive.

## 2023-01-07 NOTE — Telephone Encounter (Signed)
LM to call back.

## 2023-01-07 NOTE — Telephone Encounter (Signed)
Left results on pt VM  

## 2023-01-10 NOTE — Telephone Encounter (Signed)
LM to call back, will send lab letter today

## 2023-01-11 ENCOUNTER — Telehealth: Payer: Self-pay

## 2023-01-11 NOTE — Telephone Encounter (Signed)
Spoke with nurse @ [facility].  Yes, This pt will need to take Keflex day of .  They will send this RX  today.

## 2023-01-11 NOTE — Telephone Encounter (Signed)
Placed in front bin but waiting on provider comment

## 2023-01-11 NOTE — Telephone Encounter (Signed)
Faxed back form.

## 2023-01-11 NOTE — Telephone Encounter (Signed)
Called and LM for pt

## 2023-01-12 ENCOUNTER — Ambulatory Visit: Payer: Medicare Other

## 2023-01-14 ENCOUNTER — Other Ambulatory Visit: Payer: Self-pay

## 2023-01-14 ENCOUNTER — Telehealth: Payer: Self-pay | Admitting: Family Medicine

## 2023-01-14 MED ORDER — ATORVASTATIN CALCIUM 80 MG PO TABS: 80.0000 mg | ORAL_TABLET | Freq: Every evening | ORAL | 0 refills | Status: DC

## 2023-01-14 NOTE — Telephone Encounter (Signed)
Caller name: REACE FARQUHAR  On DPR?: Yes  Call back number: [phone] (home)  Provider they see: Alveria Apley, NP  Reason for call:  Pt wants update on Atorvastatin she would like prescribed.

## 2023-01-14 NOTE — Telephone Encounter (Signed)
Called LM medication is at the pharmacy

## 2023-01-14 NOTE — Progress Notes (Signed)
Restarted pt atorvastatin

## 2023-02-15 ENCOUNTER — Encounter: Payer: Self-pay | Admitting: Family Medicine

## 2023-02-15 ENCOUNTER — Ambulatory Visit (INDEPENDENT_AMBULATORY_CARE_PROVIDER_SITE_OTHER): Payer: Medicare Other | Admitting: Family Medicine

## 2023-02-15 VITALS — BP 120/68 | HR 72 | Temp 98.3°F | Ht 65.0 in | Wt 176.4 lb

## 2023-02-15 DIAGNOSIS — E785 Hyperlipidemia, unspecified: Secondary | ICD-10-CM | POA: Diagnosis not present

## 2023-02-15 DIAGNOSIS — R918 Other nonspecific abnormal finding of lung field: Secondary | ICD-10-CM | POA: Diagnosis not present

## 2023-02-15 LAB — HEPATIC FUNCTION PANEL
ALT: 16 U/L (ref 0–35)
AST: 25 U/L (ref 0–37)
Albumin: 4.1 g/dL (ref 3.5–5.2)
Alkaline Phosphatase: 92 U/L (ref 39–117)
Bilirubin, Direct: 0.1 mg/dL (ref 0.0–0.3)
Total Bilirubin: 0.4 mg/dL (ref 0.2–1.2)
Total Protein: 7.2 g/dL (ref 6.0–8.3)

## 2023-02-15 NOTE — Patient Instructions (Addendum)
-  Placed a referral to [facility] Care for abnormal CT that was completed at her hospital visit in June.  [facility] Pulmonary Care at [facility] [address], [place], [place] [address] [phone] -You may call to schedule an appointment, if you are unable to make an appointment or receive any kind of contact about an appointment in the 2 weeks, please call back to the office to speak with Archie Patten, CMA.

## 2023-02-15 NOTE — Progress Notes (Signed)
Acute Office Visit   Subjective:  Patient ID: Kaitlin Watkins, female    DOB: 1946/06/19, 77 y.o.   MRN: [id number]  Chief Complaint  Patient presents with   Follow-up    Pt reports she had a referral to Pulmonary / this was closed .     HPI Patient has made this appointment to follow up on a referral to [facility] that was closed. Patient initially had a referral sent to [facility] in [place] on 12/20/2022 by Dr. Jacquelin Hawking for abnormal CT scan of lung. Patient was seen by this provider to establish care on 01/04/2023 and provided the contact information for [facility]. Patient called back to [facility] to obtain the contact information to [facility]. Patient was informed the referral had been canceled and needed to make another appointment with this provider for a new referral. Based on the initial referral, the referral was closed due to patient not returned attempts to contact. However, patient reports she was not contacted about referral.   ROS See HPI above      Objective:   BP 120/68   Pulse 72   Temp 98.3 F (36.8 C)   Ht 5\' 5"  (1.651 m)   Wt 176 lb 6 oz (80 kg)   SpO2 95%   BMI 29.35 kg/m    Physical Exam Vitals reviewed.  Constitutional:      General: She is not in acute distress.    Appearance: Normal appearance. She is not ill-appearing, toxic-appearing or diaphoretic.  Eyes:     General:        Right eye: No discharge.        Left eye: No discharge.     Conjunctiva/sclera: Conjunctivae normal.  Cardiovascular:     Rate and Rhythm: Normal rate.  Pulmonary:     Effort: Pulmonary effort is normal. No respiratory distress.     Breath sounds: Normal breath sounds.     Comments: Dry, non productive cough.  Musculoskeletal:        General: Normal range of motion.  Skin:    General: Skin is warm and dry.  Neurological:     General: No focal deficit present.     Mental Status: She is alert and  oriented to person, place, and time. Mental status is at baseline.  Psychiatric:        Mood and Affect: Mood normal.        Behavior: Behavior normal.        Thought Content: Thought content normal.        Judgment: Judgment normal.       Assessment & Plan:  Abnormal CT scan of lung -     Ambulatory referral to Pulmonology  Hyperlipidemia, unspecified hyperlipidemia type -     Hepatic function panel  -Placed a referral to [facility] Care for abnormal CT lung scan that was completed on 12/14/2022. [facility] Pulmonary Care at [facility] [address], [place], [place] [address] [phone] -Advised she may call to schedule an appointment, if she is unable to make an appointment or receive any kind of contact about an appointment in the 2 weeks, please call back to the office to speak with Tonya, CMA.  -Patient has restarted Atorvastatin and needed her liver function assessed since starting medication. This lab was already placed as a future order.  -Recommend patient make a 3 month chronic management appointment and to be fasting. Also, re-schedule an appointment with Raynelle Fanning,  LPN for AWV.    Zandra Abts, NP

## 2023-02-16 ENCOUNTER — Ambulatory Visit (INDEPENDENT_AMBULATORY_CARE_PROVIDER_SITE_OTHER): Payer: Medicare Other | Admitting: *Deleted

## 2023-02-16 DIAGNOSIS — Z1231 Encounter for screening mammogram for malignant neoplasm of breast: Secondary | ICD-10-CM | POA: Diagnosis not present

## 2023-02-16 DIAGNOSIS — Z0000 Encounter for general adult medical examination without abnormal findings: Secondary | ICD-10-CM | POA: Diagnosis not present

## 2023-02-16 DIAGNOSIS — Z780 Asymptomatic menopausal state: Secondary | ICD-10-CM | POA: Diagnosis not present

## 2023-02-16 NOTE — Patient Instructions (Signed)
Kaitlin Watkins , Thank you for taking time to come for your Medicare Wellness Visit. I appreciate your ongoing commitment to your health goals. Please review the following plan we discussed and let me know if I can assist you in the future.   Screening recommendations/referrals: Colonoscopy: no longer required Mammogram: education provided Bone Density: education provided Recommended yearly ophthalmology/optometry visit for glaucoma screening and checkup Recommended yearly dental visit for hygiene and checkup  Vaccinations: Influenza vaccine: Education provided Pneumococcal vaccine: up to date Tdap vaccine: up to date Shingles vaccine: Education provided    Advanced directives: Education provided      Preventive Care 65 Years and Older, Female Preventive care refers to lifestyle choices and visits with your health care provider that can promote health and wellness. What does preventive care include? A yearly physical exam. This is also called an annual well check. Dental exams once or twice a year. Routine eye exams. Ask your health care provider how often you should have your eyes checked. Personal lifestyle choices, including: Daily care of your teeth and gums. Regular physical activity. Eating a healthy diet. Avoiding tobacco and drug use. Limiting alcohol use. Practicing safe sex. Taking low-dose aspirin every day. Taking vitamin and mineral supplements as recommended by your health care provider. What happens during an annual well check? The services and screenings done by your health care provider during your annual well check will depend on your age, overall health, lifestyle risk factors, and family history of disease. Counseling  Your health care provider may ask you questions about your: Alcohol use. Tobacco use. Drug use. Emotional well-being. Home and relationship well-being. Sexual activity. Eating habits. History of falls. Memory and ability to understand  (cognition). Work and work Astronomer. Reproductive health. Screening  You may have the following tests or measurements: Height, weight, and BMI. Blood pressure. Lipid and cholesterol levels. These may be checked every 5 years, or more frequently if you are over 40 years old. Skin check. Lung cancer screening. You may have this screening every year starting at age 12 if you have a 30-pack-year history of smoking and currently smoke or have quit within the past 15 years. Fecal occult blood test (FOBT) of the stool. You may have this test every year starting at age 30. Flexible sigmoidoscopy or colonoscopy. You may have a sigmoidoscopy every 5 years or a colonoscopy every 10 years starting at age 34. Hepatitis C blood test. Hepatitis B blood test. Sexually transmitted disease (STD) testing. Diabetes screening. This is done by checking your blood sugar (glucose) after you have not eaten for a while (fasting). You may have this done every 1-3 years. Bone density scan. This is done to screen for osteoporosis. You may have this done starting at age 41. Mammogram. This may be done every 1-2 years. Talk to your health care provider about how often you should have regular mammograms. Talk with your health care provider about your test results, treatment options, and if necessary, the need for more tests. Vaccines  Your health care provider may recommend certain vaccines, such as: Influenza vaccine. This is recommended every year. Tetanus, diphtheria, and acellular pertussis (Tdap, Td) vaccine. You may need a Td booster every 10 years. Zoster vaccine. You may need this after age 57. Pneumococcal 13-valent conjugate (PCV13) vaccine. One dose is recommended after age 57. Pneumococcal polysaccharide (PPSV23) vaccine. One dose is recommended after age 14. Talk to your health care provider about which screenings and vaccines you need and how often you  need them. This information is not intended to  replace advice given to you by your health care provider. Make sure you discuss any questions you have with your health care provider. Document Released: 07/18/2015 Document Revised: 03/10/2016 Document Reviewed: 04/22/2015 Elsevier Interactive Patient Education  2017 [employer].  Fall Prevention in the Home Falls can cause injuries. They can happen to people of all ages. There are many things you can do to make your home safe and to help prevent falls. What can I do on the outside of my home? Regularly fix the edges of walkways and driveways and fix any cracks. Remove anything that might make you trip as you walk through a door, such as a raised step or threshold. Trim any bushes or trees on the path to your home. Use bright outdoor lighting. Clear any walking paths of anything that might make someone trip, such as rocks or tools. Regularly check to see if handrails are loose or broken. Make sure that both sides of any steps have handrails. Any raised decks and porches should have guardrails on the edges. Have any leaves, snow, or ice cleared regularly. Use sand or salt on walking paths during winter. Clean up any spills in your garage right away. This includes oil or grease spills. What can I do in the bathroom? Use night lights. Install grab bars by the toilet and in the tub and shower. Do not use towel bars as grab bars. Use non-skid mats or decals in the tub or shower. If you need to sit down in the shower, use a plastic, non-slip stool. Keep the floor dry. Clean up any water that spills on the floor as soon as it happens. Remove soap buildup in the tub or shower regularly. Attach bath mats securely with double-sided non-slip rug tape. Do not have throw rugs and other things on the floor that can make you trip. What can I do in the bedroom? Use night lights. Make sure that you have a light by your bed that is easy to reach. Do not use any sheets or blankets that are too big for  your bed. They should not hang down onto the floor. Have a firm chair that has side arms. You can use this for support while you get dressed. Do not have throw rugs and other things on the floor that can make you trip. What can I do in the kitchen? Clean up any spills right away. Avoid walking on wet floors. Keep items that you use a lot in easy-to-reach places. If you need to reach something above you, use a strong step stool that has a grab bar. Keep electrical cords out of the way. Do not use floor polish or wax that makes floors slippery. If you must use wax, use non-skid floor wax. Do not have throw rugs and other things on the floor that can make you trip. What can I do with my stairs? Do not leave any items on the stairs. Make sure that there are handrails on both sides of the stairs and use them. Fix handrails that are broken or loose. Make sure that handrails are as long as the stairways. Check any carpeting to make sure that it is firmly attached to the stairs. Fix any carpet that is loose or worn. Avoid having throw rugs at the top or bottom of the stairs. If you do have throw rugs, attach them to the floor with carpet tape. Make sure that you have a light switch  at the top of the stairs and the bottom of the stairs. If you do not have them, ask someone to add them for you. What else can I do to help prevent falls? Wear shoes that: Do not have high heels. Have rubber bottoms. Are comfortable and fit you well. Are closed at the toe. Do not wear sandals. If you use a stepladder: Make sure that it is fully opened. Do not climb a closed stepladder. Make sure that both sides of the stepladder are locked into place. Ask someone to hold it for you, if possible. Clearly mark and make sure that you can see: Any grab bars or handrails. First and last steps. Where the edge of each step is. Use tools that help you move around (mobility aids) if they are needed. These  include: Canes. Walkers. Scooters. Crutches. Turn on the lights when you go into a dark area. Replace any light bulbs as soon as they burn out. Set up your furniture so you have a clear path. Avoid moving your furniture around. If any of your floors are uneven, fix them. If there are any pets around you, be aware of where they are. Review your medicines with your doctor. Some medicines can make you feel dizzy. This can increase your chance of falling. Ask your doctor what other things that you can do to help prevent falls. This information is not intended to replace advice given to you by your health care provider. Make sure you discuss any questions you have with your health care provider. Document Released: 04/17/2009 Document Revised: 11/27/2015 Document Reviewed: 07/26/2014 Elsevier Interactive Patient Education  2017 [employer].
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
## 2023-02-16 NOTE — Progress Notes (Signed)
Pt has been notified.

## 2023-02-22 ENCOUNTER — Other Ambulatory Visit: Payer: Self-pay | Admitting: Family Medicine

## 2023-04-05 ENCOUNTER — Ambulatory Visit: Payer: Medicare HMO | Admitting: Family Medicine

## 2023-04-05 ENCOUNTER — Encounter: Payer: Self-pay | Admitting: Internal Medicine

## 2023-04-05 ENCOUNTER — Ambulatory Visit: Payer: Medicare HMO | Admitting: Internal Medicine

## 2023-04-05 VITALS — BP 138/97 | HR 80 | Ht 65.5 in | Wt 178.8 lb

## 2023-04-05 DIAGNOSIS — R918 Other nonspecific abnormal finding of lung field: Secondary | ICD-10-CM | POA: Diagnosis not present

## 2023-04-05 DIAGNOSIS — Z09 Encounter for follow-up examination after completed treatment for conditions other than malignant neoplasm: Secondary | ICD-10-CM

## 2023-04-05 DIAGNOSIS — R053 Chronic cough: Secondary | ICD-10-CM | POA: Diagnosis not present

## 2023-04-05 DIAGNOSIS — Z8679 Personal history of other diseases of the circulatory system: Secondary | ICD-10-CM | POA: Diagnosis not present

## 2023-04-05 DIAGNOSIS — Z8709 Personal history of other diseases of the respiratory system: Secondary | ICD-10-CM | POA: Diagnosis not present

## 2023-04-05 DIAGNOSIS — R0602 Shortness of breath: Secondary | ICD-10-CM

## 2023-04-05 DIAGNOSIS — R0609 Other forms of dyspnea: Secondary | ICD-10-CM

## 2023-04-05 DIAGNOSIS — Z7722 Contact with and (suspected) exposure to environmental tobacco smoke (acute) (chronic): Secondary | ICD-10-CM

## 2023-04-05 NOTE — Patient Instructions (Addendum)
ICD-10-CM   1. History of acute respiratory failure  Z87.09     2. Second hand smoke exposure  Z77.22     3. History of acute heart failure  Z86.79     4. Abnormal CT scan of lung  R91.8     5. DOE (dyspnea on exertion)  R06.09     6. Chronic cough  R05.3      Need to figure out what exactly is going on in the lung some 3 months after your recent admission for pneumonia. Possible COPD versus ILD  Plan - Do high-resolution CT chest supine and prone and inspiratory and expiratory volume  -Okay to do this at [facility]  -To be done in the next few weeks -Do full pulmonary function test in the next few weeks  -Can be done at [facility] -Do CBC, chemistry, liver function test, BNP, QuantiFERON gold -Do ESR, ANA, double-stranded DNA, rheumatoid factor, CCP  Follow-up - Video visit or face-to-face visit with [name] or nurse practitioner to review results; in the next few weeks.

## 2023-04-05 NOTE — Progress Notes (Signed)
OV 04/05/2023  Subjective:  Patient ID: Kaitlin Watkins, female , DOB: 1946/05/17 , age 77 y.o. , MRN: [id number] , ADDRESS: [address] [place] [address] PCP Alveria Apley, NP Patient Care Team: Alveria Apley, NP as PCP - General (Family Medicine)  This Provider for this visit: Treatment Team:  Attending Provider: Kalman Shan, MD    04/05/2023 -   Chief Complaint  Patient presents with   Consult    Abnormal CT      HPI Kaitlin Watkins 77 y.o. -new consult referred by hospitalist Dr. Jacquelin Hawking.  Because of concern of ILD versus COPD versus both.  History is obtained from talking to the patient who is here by herself and also review of the medical records.  She has been divorced for 5 years.  She lives in an apartment in [place].  She lives with her 41 year old granddaughter who smokes heavily [in fact patient smelling of tobacco but denies active smoking].  There is also a great grandchild in the house.  She tells me that she has had chronic cough for the last 2 years and shortness of breath the last 9 months or so symptoms are associated with wheezing they are worse with exertion.  Progression.  Moderate to severe in intensity.  In June 2024 she got hospitalized at that time the cough is really bad.  She got diagnosed with pneumonia and heart failure and COPD.  Review of the records she got diuresed.  BNP went from 1500 to normal in that same hospitalization.  CT scan of the chest shows engorged vessels and therefore is hard to say if it is ILD or not.  Was chest CT with contrast.  Despite objective improvement based on chart review she is subjectively not better she still coughing she still wheezing she is still short of breath.  Symptoms happen anytime can happen at night as well.  exercise 04/05/2023    O2 used ra   Number laps completed 15 sit stand   Comments about pace slow   Resting Pulse Ox/HR 95% and 85/min   Final Pulse Ox/HR  93% and 92/min   Desaturated </= 88% no   Desaturated <= 3% points yes   Got Tachycardic >/= 90/min yes   Symptoms at end of test Dyspneic, cough   Miscellaneous comments x          ECHO 12/14/22   IMPRESSIONS     1. Left ventricular ejection fraction, by estimation, is 60 to 65%. The  left ventricle has normal function. The left ventricle has no regional  wall motion abnormalities. Left ventricular diastolic parameters are  consistent with Grade I diastolic  dysfunction (impaired relaxation).   2. Right ventricular systolic function is normal. The right ventricular  size is mildly enlarged. There is normal pulmonary artery systolic  pressure. The estimated right ventricular systolic pressure is 33.7 mmHg.   3. The mitral valve is grossly normal. Trivial mitral valve  regurgitation. No evidence of mitral stenosis.   4. The aortic valve is tricuspid. Aortic valve regurgitation is not  visualized. Aortic valve sclerosis is present, with no evidence of aortic  valve stenosis.   5. The inferior vena cava is normal in size with greater than 50%  respiratory variability, suggesting right atrial pressure of 3 mmHg.    CT Chest data from date: 12/14/22  - personally visualized and independently interpreted : YES - my findings are: AS BELOW  IMPRESSION: 1. Motion limited study demonstrating no definite central, lobar or proximal segmental sized pulmonary embolism. 2. The appearance of the lungs is most compatible with severe multilobar bilateral bronchopneumonia. The possibility of some degree of underlying interstitial lung disease is not entirely excluded. Follow-up high-resolution chest CT should be considered in 3 months after complete resolution of the patient's acute illness to assess for areas of non reversible parenchymal lung disease. 3. Multiple borderline enlarged and mildly enlarged mediastinal and bilateral hilar lymph nodes could be reactive, or could  be associated with underlying interstitial lung disease. Attention on follow-up imaging is recommended to ensure stability or regression. 4. Cardiomegaly with left atrial dilatation. 5. Dilatation of the pulmonic trunk and right main pulmonary artery, which may suggest underlying pulmonary arterial hypertension. 6. Aortic atherosclerosis, in addition to left main and 2 vessel coronary artery disease. Assessment for potential risk factor modification, dietary therapy or pharmacologic therapy may be warranted, if clinically indicated.   Aortic Atherosclerosis (ICD10-I70.0).     Electronically Signed   By: Trudie Reed M.D.   On: 12/14/2022 06:32   PFT    Latest Ref Rng & Units 04/21/2016    8:30 AM  PFT Results  FVC-Pre L 2.78   FVC-Predicted Pre % 91   FVC-Post L 2.63   FVC-Predicted Post % 86   Pre FEV1/FVC % % 86   Post FEV1/FCV % % 86   FEV1-Pre L 2.38   FEV1-Predicted Pre % 103   FEV1-Post L 2.26   DLCO uncorrected ml/min/mmHg 14.23   DLCO UNC% % 55   DLVA Predicted % 71   TLC L 5.16   TLC % Predicted % 99   RV % Predicted % 99         LAB RESULTS last 96 hours No results found.  LAB RESULTS last 90 days Recent Results (from the past 2160 hour(s))  Hepatic function panel     Status: None   Collection Time: 02/15/23  1:45 PM  Result Value Ref Range   Total Bilirubin 0.4 0.2 - 1.2 mg/dL   Bilirubin, Direct 0.1 0.0 - 0.3 mg/dL   Alkaline Phosphatase 92 39 - 117 U/L   AST 25 0 - 37 U/L   ALT 16 0 - 35 U/L   Total Protein 7.2 6.0 - 8.3 g/dL   Albumin 4.1 3.5 - 5.2 g/dL         has a past medical history of Arthritis, DVT (deep venous thrombosis) (HCC) (07/05/2005), GERD (gastroesophageal reflux disease), Heart failure (HCC), History of DVT of lower extremity (10/30/2013), Hyperlipidemia, Hypertension, Knee pain, left anterior, Pneumonia (2024), PONV (postoperative nausea and vomiting), Tear of medial meniscus of right knee, Vitamin B12 deficiency,  Vitamin D deficiency, and Wears glasses.   reports that she quit smoking about 58 years ago. Her smoking use included cigarettes. She started smoking about 60 years ago. She has a 0.5 pack-year smoking history. She has never used smokeless tobacco.  Past Surgical History:  Procedure Laterality Date   ABDOMINAL HYSTERECTOMY  1975   APPENDECTOMY  1975   BILATERAL SALPINGECTOMY     BREAST BIOPSY Left 12/15/2015   BREAST BIOPSY  06/19/2012   CARPAL TUNNEL RELEASE Left 2009   CYSTOSTOMY W/ BLADDER BIOPSY     KNEE ARTHROSCOPY Left 1989   KNEE ARTHROSCOPY WITH LATERAL MENISECTOMY Right 04/19/2013   Procedure: ARTHROSCOPY RITH KNEE BILATERAL PARTIAL MENISECTOMY ;  Surgeon: Javier Docker, MD;  Location: [facility];  Service: Orthopedics;  Laterality:  Right;   LEG SURGERY  2007   left leg DVT   LIPOMA EXCISION N/A 10/26/2013   Procedure: EXCISION LIPOMA OF MID BACK;  Surgeon: Valarie Merino, MD;  Location: [facility];  Service: General;  Laterality: N/A;   REFRACTIVE SURGERY Bilateral 2004   SKIN CANCER EXCISION Left    foot   TONSILLECTOMY     Third Grade    TOTAL KNEE ARTHROPLASTY Left 12/06/2013   Procedure: LEFT TOTAL KNEE ARTHROPLASTY;  Surgeon: Javier Docker, MD;  Location: [facility];  Service: Orthopedics;  Laterality: Left;   VAGINAL HYSTERECTOMY  1975   Pain    Allergies  Allergen Reactions   Influenza A (H1n1) Monoval Vac Anaphylaxis   Prolia [Denosumab] Rash    Blistering rash around injection site   Egg-Derived Products Other (See Comments)    Patient can eat eggs but cannot have a flu shot Dr. Alberteen Sam pcp gave her flu shot 2019 (did not have reaction).    Influenza Vaccines Other (See Comments)    Per her allergist she is not to take one.    Immunization History  Administered Date(s) Administered   Influenza,inj,Quad PF,6+ Mos 08/25/2017   Influenza,inj,Quad PF,6-35 Mos 08/25/2017   PFIZER(Purple Top)SARS-COV-2 Vaccination 11/26/2019, 02/02/2020    Pneumococcal Conjugate-13 09/11/2013   Pneumococcal Polysaccharide-23 12/23/2014   Tdap 09/11/2013   Zoster Recombinant(Shingrix) 02/12/2019   Zoster, Live 07/05/2008, 03/31/2012    Family History  Problem Relation Age of Onset   Heart disease Mother        CHF   Stroke Mother 8   Cancer Mother 89       Cerivcal Cancer   Breast cancer Mother    Stroke Father 47   Alcohol abuse Father    Heart disease Sister 22       MI   Breast cancer Sister    Heart disease Brother    Stroke Brother    Heart disease Brother 71       MI   Cancer Brother 13       Prostate   Other Brother        Passed away at age 74 of MVA   Arthritis Daughter    Other Daughter        Back Surgery   Rosacea Daughter    Single kidney Daughter    Deep vein thrombosis Paternal Grandmother      Current Outpatient Medications:    acetaminophen (TYLENOL) 500 MG tablet, Take 500 mg by mouth every 6 (six) hours as needed for headache., Disp: , Rfl:    albuterol (PROVENTIL) (2.5 MG/3ML) 0.083% nebulizer solution, Take 2.5 mg by nebulization every 6 (six) hours as needed for wheezing or shortness of breath., Disp: , Rfl:    aspirin EC 81 MG tablet, Take 81 mg by mouth at bedtime., Disp: , Rfl:    atorvastatin (LIPITOR) 80 MG tablet, TAKE 1 TABLET BY MOUTH EVERY EVENING, Disp: 90 tablet, Rfl: 0   b complex vitamins capsule, Take 1 capsule by mouth daily., Disp: , Rfl:    ibuprofen (ADVIL) 200 MG tablet, Take 200 mg by mouth every 6 (six) hours as needed for headache., Disp: , Rfl:    levocetirizine (XYZAL) 5 MG tablet, Take 5 mg by mouth every evening., Disp: , Rfl:    Multiple Vitamin (MULTIVITAMIN WITH MINERALS) TABS tablet, Take 1 tablet by mouth daily., Disp: , Rfl:    Polyvinyl Alcohol-Povidone (REFRESH OP), Place 1 drop into both eyes  2 (two) times daily as needed (dry eyes)., Disp: , Rfl:    losartan (COZAAR) 50 MG tablet, Take 1 tablet (50 mg total) by mouth daily., Disp: 30 tablet, Rfl: 2       Objective:   Vitals:   04/05/23 1504  BP: (!) 138/97  Pulse: 80  SpO2: 97%  Weight: 178 lb 12.8 oz (81.1 kg)  Height: 5' 5.5" (1.664 m)    Estimated body mass index is 29.3 kg/m as calculated from the following:   Height as of this encounter: 5' 5.5" (1.664 m).   Weight as of this encounter: 178 lb 12.8 oz (81.1 kg).  @WEIGHTCHANGE @  Filed Weights   04/05/23 1504  Weight: 178 lb 12.8 oz (81.1 kg)     Physical Exam   General: No distress. lean O2 at rest: no Cane present: no Sitting in wheel chair: no Frail: no Obese: no Neuro: Alert and Oriented x 3. GCS 15. Speech normal Psych: Pleasant Resp:  Barrel Chest - no.  Wheeze - yes scaterd, Crackles - yes scattered, No overt respiratory distress CVS: Normal heart sounds. Murmurs - no Ext: Stigmata of Connective Tissue Disease - no HEENT: Normal upper airway. PEERL +. No post nasal drip        Assessment:       ICD-10-CM   1. History of acute respiratory failure  Z87.09     2. Second hand smoke exposure  Z77.22     3. History of acute heart failure  Z86.79     4. Abnormal CT scan of lung  R91.8 Hepatic function panel    CBC with Differential    Basic Metabolic Panel (BMET)    B Nat Peptide    QuantiFERON-TB Gold Plus    Sed Rate (ESR)    Antinuclear Antib (ANA)    Rheumatoid Factor    Cyclic citrul peptide antibody, IgG    CT Chest High Resolution    5. DOE (dyspnea on exertion)  R06.09 Pulmonary function test    CT Chest High Resolution    6. Chronic cough  R05.3     7. Hospital discharge follow-up  Z09          Plan:     Patient Instructions     ICD-10-CM   1. History of acute respiratory failure  Z87.09     2. Second hand smoke exposure  Z77.22     3. History of acute heart failure  Z86.79     4. Abnormal CT scan of lung  R91.8     5. DOE (dyspnea on exertion)  R06.09     6. Chronic cough  R05.3      Need to figure out what exactly is going on in the lung some 3 months after  your recent admission for pneumonia. Possible COPD versus ILD  Plan - Do high-resolution CT chest supine and prone and inspiratory and expiratory volume  -Okay to do this at [facility]  -To be done in the next few weeks -Do full pulmonary function test in the next few weeks  -[facility] be done at [facility] -Do CBC, chemistry, liver function test, BNP, QuantiFERON gold -Do ESR, ANA, double-stranded DNA, rheumatoid factor, CCP  Follow-up - Video visit or face-to-face visit with [name] or nurse practitioner to review results; in the next few weeks.   FOLLOWUP Return in about 4 weeks (around 05/03/2023) for 15 min visit, with Dr Marchelle Gearing.    SIGNATURE    Dr. Carmin Muskrat  Marchelle Gearing, M.D., F.C.C.P,  Pulmonary and Critical Care Medicine Staff Physician, [facility] Director - Interstitial Lung Disease  Program  Pulmonary Fibrosis [facility] at [facility] [place], [place], [address]  Pager: [phone], If no answer or between  15:00h - 7:00h: call [phone] Telephone: [phone]  3:39 PM 04/05/2023

## 2023-04-06 LAB — SEDIMENTATION RATE: Sed Rate: 42 mm/h — ABNORMAL HIGH (ref 0–30)

## 2023-04-06 LAB — HEPATIC FUNCTION PANEL
ALT: 15 U/L (ref 0–35)
AST: 26 U/L (ref 0–37)
Albumin: 3.9 g/dL (ref 3.5–5.2)
Alkaline Phosphatase: 98 U/L (ref 39–117)
Bilirubin, Direct: 0.1 mg/dL (ref 0.0–0.3)
Total Bilirubin: 0.5 mg/dL (ref 0.2–1.2)
Total Protein: 7 g/dL (ref 6.0–8.3)

## 2023-04-06 LAB — BASIC METABOLIC PANEL
BUN: 16 mg/dL (ref 6–23)
CO2: 28 meq/L (ref 19–32)
Calcium: 9.3 mg/dL (ref 8.4–10.5)
Chloride: 105 meq/L (ref 96–112)
Creatinine, Ser: 0.7 mg/dL (ref 0.40–1.20)
GFR: 82.46 mL/min (ref >60.00)
Glucose, Bld: 94 mg/dL (ref 70–99)
Potassium: 3.9 meq/L (ref 3.5–5.1)
Sodium: 139 meq/L (ref 135–145)

## 2023-04-06 LAB — CBC WITH DIFFERENTIAL/PLATELET
Basophils Absolute: 0.1 10*3/uL (ref 0.0–0.1)
Basophils Relative: 1.1 % (ref 0.0–3.0)
Eosinophils Absolute: 0.4 10*3/uL (ref 0.0–0.7)
Eosinophils Relative: 5.3 % — ABNORMAL HIGH (ref 0.0–5.0)
HCT: 40 % (ref 36.0–46.0)
Hemoglobin: 12.7 g/dL (ref 12.0–15.0)
Lymphocytes Relative: 21.3 % (ref 12.0–46.0)
Lymphs Abs: 1.6 10*3/uL (ref 0.7–4.0)
MCHC: 31.6 g/dL (ref 30.0–36.0)
MCV: 87.1 fL (ref 78.0–100.0)
Monocytes Absolute: 0.7 10*3/uL (ref 0.1–1.0)
Monocytes Relative: 9.6 % (ref 3.0–12.0)
Neutro Abs: 4.7 10*3/uL (ref 1.4–7.7)
Neutrophils Relative %: 62.7 % (ref 43.0–77.0)
Platelets: 276 10*3/uL (ref 150.0–400.0)
RBC: 4.59 Mil/uL (ref 3.87–5.11)
RDW: 14.6 % (ref 11.5–15.5)
WBC: 7.6 10*3/uL (ref 4.0–10.5)

## 2023-04-06 LAB — ANTI-DNA ANTIBODY, DOUBLE-STRANDED: ds DNA Ab: 5 [IU]/mL — ABNORMAL HIGH

## 2023-04-07 LAB — ANA: Anti Nuclear Antibody (ANA): POSITIVE — AB

## 2023-04-07 LAB — ANTI-NUCLEAR AB-TITER (ANA TITER)
ANA TITER: 1:40 {titer} — ABNORMAL HIGH
ANA Titer 1: 1:80 {titer} — ABNORMAL HIGH

## 2023-04-07 LAB — QUANTIFERON-TB GOLD PLUS
Mitogen-NIL: 7.41 [IU]/mL
NIL: 0.01 [IU]/mL
QuantiFERON-TB Gold Plus: NEGATIVE
TB1-NIL: 0 [IU]/mL
TB2-NIL: 0 [IU]/mL

## 2023-04-07 LAB — CYCLIC CITRUL PEPTIDE ANTIBODY, IGG: Cyclic Citrullin Peptide Ab: <16 U

## 2023-04-07 LAB — RHEUMATOID FACTOR: Rheumatoid fact SerPl-aCnc: <10 [IU]/mL (ref <14)

## 2023-04-11 ENCOUNTER — Other Ambulatory Visit (INDEPENDENT_AMBULATORY_CARE_PROVIDER_SITE_OTHER): Payer: Medicare HMO

## 2023-04-11 ENCOUNTER — Other Ambulatory Visit: Payer: Self-pay | Admitting: *Deleted

## 2023-04-11 DIAGNOSIS — R0609 Other forms of dyspnea: Secondary | ICD-10-CM

## 2023-04-11 LAB — BRAIN NATRIURETIC PEPTIDE: Pro B Natriuretic peptide (BNP): 174 pg/mL — ABNORMAL HIGH (ref 0.0–100.0)

## 2023-05-12 ENCOUNTER — Ambulatory Visit (HOSPITAL_COMMUNITY)
Admission: RE | Admit: 2023-05-12 | Discharge: 2023-05-12 | Disposition: A | Discharge status: Home / Self Care | Payer: Medicare HMO | Source: Ambulatory Visit | Attending: Internal Medicine | Admitting: Internal Medicine

## 2023-05-12 ENCOUNTER — Other Ambulatory Visit: Payer: Self-pay | Admitting: Family Medicine

## 2023-05-12 DIAGNOSIS — R918 Other nonspecific abnormal finding of lung field: Secondary | ICD-10-CM | POA: Diagnosis not present

## 2023-05-12 DIAGNOSIS — R0609 Other forms of dyspnea: Secondary | ICD-10-CM

## 2023-05-12 DIAGNOSIS — J984 Other disorders of lung: Secondary | ICD-10-CM | POA: Diagnosis not present

## 2023-05-12 DIAGNOSIS — J8410 Pulmonary fibrosis, unspecified: Secondary | ICD-10-CM | POA: Diagnosis not present

## 2023-05-12 DIAGNOSIS — I10 Essential (primary) hypertension: Secondary | ICD-10-CM

## 2023-05-20 ENCOUNTER — Encounter: Payer: Self-pay | Admitting: Family Medicine

## 2023-05-20 ENCOUNTER — Ambulatory Visit (INDEPENDENT_AMBULATORY_CARE_PROVIDER_SITE_OTHER): Payer: Medicare HMO | Admitting: Family Medicine

## 2023-05-20 VITALS — BP 118/84 | HR 70 | Temp 98.0°F | Ht 63.5 in | Wt 171.4 lb

## 2023-05-20 DIAGNOSIS — E559 Vitamin D deficiency, unspecified: Secondary | ICD-10-CM

## 2023-05-20 DIAGNOSIS — E538 Deficiency of other specified B group vitamins: Secondary | ICD-10-CM

## 2023-05-20 DIAGNOSIS — R7303 Prediabetes: Secondary | ICD-10-CM

## 2023-05-20 DIAGNOSIS — I10 Essential (primary) hypertension: Secondary | ICD-10-CM | POA: Diagnosis not present

## 2023-05-20 DIAGNOSIS — E785 Hyperlipidemia, unspecified: Secondary | ICD-10-CM

## 2023-05-20 NOTE — Patient Instructions (Signed)
-  It was great to see you today.  -Continue your medications.  -Ordered labs. Office will call with lab results and you will see them on MyChart. -Recommend to obtain covid booster, zoster vaccine, and influenza vaccine at local pharmacy. -Recommend to schedule your mammogram and bone density screening.  -Follow up in 6 months for a physical.  -I hope your family and you have a wonderful holiday!

## 2023-05-20 NOTE — Progress Notes (Signed)
Established Patient Office Visit   Subjective:  Patient ID: Kaitlin Watkins, female    DOB: [birth year]  Age: 77 y.o. MRN: [id number]  Chief Complaint  Patient presents with   Medical Management of Chronic Issues    HPI HTN: Chronic. Patient is taking Losartan 50mg  daily. She reports she has been monitoring her blood pressure at home. Usually 120/70. Denies chest pain, HA, dizziness, and lightheadedness. She does have a SHOB, but under the care of pulmonary.  BP Readings from Last 3 Encounters:  05/20/23 118/84  04/05/23 (!) 138/97  02/15/23 120/68    Hyperlipidemia: Chronic. Patient is taking Atorvastatin 80mg  daily. Denies muscle pain or joint pain. She does have pain from coughing so much, but under the care of pulmonary.  Lab Results  Component Value Date   CHOL 237 (H) 01/05/2023   HDL 44.20 01/05/2023   LDLCALC 177 (H) 01/05/2023   TRIG 79.0 01/05/2023   CHOLHDL 5 01/05/2023     ROS See HPI above     Objective:   BP 118/84 (BP Location: Left Arm, Patient Position: Sitting, Cuff Size: Normal)   Pulse 70   Temp 98 F (36.7 C) (Oral)   Ht 5' 3.5" (1.613 m)   Wt 171 lb 6.4 oz (77.7 kg)   SpO2 95%   BMI 29.89 kg/m    Physical Exam Vitals reviewed.  Constitutional:      General: She is not in acute distress.    Appearance: Normal appearance. She is not ill-appearing, toxic-appearing or diaphoretic.  HENT:     Head: Normocephalic and atraumatic.  Eyes:     General:        Right eye: No discharge.        Left eye: No discharge.     Conjunctiva/sclera: Conjunctivae normal.  Cardiovascular:     Rate and Rhythm: Normal rate and regular rhythm.     Heart sounds: Normal heart sounds. No murmur heard.    No friction rub. No gallop.  Pulmonary:     Effort: Pulmonary effort is normal. No respiratory distress.     Breath sounds: Normal breath sounds.     Comments: Dry cough during visit  Musculoskeletal:        General: Normal range of motion.  Skin:     General: Skin is warm and dry.  Neurological:     General: No focal deficit present.     Mental Status: She is alert and oriented to person, place, and time. Mental status is at baseline.  Psychiatric:        Mood and Affect: Mood normal.        Behavior: Behavior normal.        Thought Content: Thought content normal.        Judgment: Judgment normal.      Assessment & Plan:  Essential hypertension, benign Assessment & Plan: Blood pressure is stable. Continue Losartan 50mg  daily. Kidney function was stable on 10/01.      Hyperlipidemia, unspecified hyperlipidemia type Assessment & Plan: Stable. Continue with Atorvastatin 80mg  daily. Ordered lipid panel. Patient is fasting today.   Orders: -     Lipid panel  Vitamin D deficiency -     VITAMIN D 25 Hydroxy (Vit-D Deficiency, Fractures)  Vitamin B12 deficiency -     Vitamin B12  Prediabetes -     Hemoglobin A1c   1.Review health maintenance:  -Covid booster: Local pharmacy -Zoster vaccine: Local pharmacy  -Influenza vaccine: Local pharmacy -Recommend to  schedule mammogram and bone density screening  2.Ordered labs:  -Vitamin D for vitamin D deficiency, last vitamin D was 26.12. She is taking supplement. -Vitamin B12 for vitamin B12 deficiency, last vitamin B12 was 216. She is taking supplement.  -A1c for prediabetes, last A1c was 6.2.  Return in about 6 months (around 11/17/2023) for physical.   Zandra Abts, NP

## 2023-05-20 NOTE — Assessment & Plan Note (Signed)
Stable. Continue with Atorvastatin 80mg  daily. Ordered lipid panel. Patient is fasting today.

## 2023-05-20 NOTE — Assessment & Plan Note (Signed)
Blood pressure is stable. Continue Losartan 50mg  daily. Kidney function was stable on 10/01.

## 2023-05-21 LAB — LIPID PANEL
Cholesterol: 173 mg/dL (ref <200)
HDL: 53 mg/dL (ref >=50)
LDL Cholesterol (Calc): 103 mg/dL — ABNORMAL HIGH
Non-HDL Cholesterol (Calc): 120 mg/dL (ref <130)
Total CHOL/HDL Ratio: 3.3 (calc) (ref <5.0)
Triglycerides: 80 mg/dL (ref <150)

## 2023-05-21 LAB — HEMOGLOBIN A1C
Hgb A1c MFr Bld: 6.1 %{Hb} — ABNORMAL HIGH (ref <5.7)
Mean Plasma Glucose: 128 mg/dL
eAG (mmol/L): 7.1 mmol/L

## 2023-05-21 LAB — VITAMIN D 25 HYDROXY (VIT D DEFICIENCY, FRACTURES): Vit D, 25-Hydroxy: 40 ng/mL (ref 30–100)

## 2023-05-21 LAB — VITAMIN B12: Vitamin B-12: 640 pg/mL (ref 200–1100)

## 2023-05-24 ENCOUNTER — Encounter: Payer: Self-pay | Admitting: Primary Care

## 2023-05-24 ENCOUNTER — Ambulatory Visit: Payer: Medicare HMO | Admitting: Primary Care
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
## 2023-07-01 NOTE — Telephone Encounter (Signed)
error 

## 2023-07-22 ENCOUNTER — Encounter: Payer: Self-pay | Admitting: Family Medicine

## 2023-07-22 ENCOUNTER — Telehealth: Payer: Medicare HMO | Admitting: Nurse Practitioner

## 2023-07-22 DIAGNOSIS — J0140 Acute pansinusitis, unspecified: Secondary | ICD-10-CM | POA: Diagnosis not present

## 2023-07-22 MED ORDER — BENZONATATE 100 MG PO CAPS: 100.0000 mg | ORAL_CAPSULE | Freq: Three times a day (TID) | ORAL | 0 refills | Status: AC | PRN

## 2023-07-22 MED ORDER — DOXYCYCLINE HYCLATE 100 MG PO TABS: 100.0000 mg | ORAL_TABLET | Freq: Two times a day (BID) | ORAL | 0 refills | Status: AC

## 2023-07-22 NOTE — Progress Notes (Signed)
E-Visit for Sinus Problems  We are sorry that you are not feeling well.  Here is how we plan to help!  Based on what you have shared with me it looks like you have sinusitis.  Sinusitis is inflammation and infection in the sinus cavities of the head.  Based on your presentation I believe you most likely have Acute Bacterial Sinusitis.  This is an infection caused by bacteria and is treated with antibiotics. I have prescribed Doxycycline 100mg  by mouth twice a day for 10 days.  We will also call in some medication for your cough   Meds ordered this encounter  Medications   doxycycline (VIBRA-TABS) 100 MG tablet    Sig: Take 1 tablet (100 mg total) by mouth 2 (two) times daily for 10 days.    Dispense:  20 tablet    Refill:  0   benzonatate (TESSALON) 100 MG capsule    Sig: Take 1 capsule (100 mg total) by mouth 3 (three) times daily as needed.    Dispense:  30 capsule    Refill:  0     You may use an oral decongestant such as Mucinex D or if you have glaucoma or high blood pressure use plain Mucinex. Saline nasal spray help and can safely be used as often as needed for congestion.  If you develop worsening sinus pain, fever or notice severe headache and vision changes, or if symptoms are not better after completion of antibiotic, please schedule an appointment with a health care provider.    Sinus infections are not as easily transmitted as other respiratory infection, however we still recommend that you avoid close contact with loved ones, especially the very young and elderly.  Remember to wash your hands thoroughly throughout the day as this is the number one way to prevent the spread of infection!  Home Care: Only take medications as instructed by your medical team. Complete the entire course of an antibiotic. Do not take these medications with alcohol. A steam or ultrasonic humidifier can help congestion.  You can place a towel over your head and breathe in the steam from hot water  coming from a faucet. Avoid close contacts especially the very young and the elderly. Cover your mouth when you cough or sneeze. Always remember to wash your hands.  Get Help Right Away If: You develop worsening fever or sinus pain. You develop a severe head ache or visual changes. Your symptoms persist after you have completed your treatment plan.  Make sure you Understand these instructions. Will watch your condition. Will get help right away if you are not doing well or get worse.  Thank you for choosing an e-visit.  Your e-visit answers were reviewed by a board certified advanced clinical practitioner to complete your personal care plan. Depending upon the condition, your plan could have included both over the counter or prescription medications.  Please review your pharmacy choice. Make sure the pharmacy is open so you can pick up prescription now. If there is a problem, you may contact your provider through [employer] and have the prescription routed to another pharmacy.  Your safety is important to [place]. If you have drug allergies check your prescription carefully.   For the next 24 hours you can use MyChart to ask questions about today's visit, request a non-urgent call back, or ask for a work or school excuse. You will get an email in the next two days asking about your experience. I hope that your e-visit  has been valuable and will speed your recovery.   I spent approximately 5 minutes reviewing the patient's history, current symptoms and coordinating their care today.

## 2023-07-22 NOTE — Telephone Encounter (Signed)
Contacted pt and daughter answered called.   She states pt is with her and pt couldn't talk due to sore throat. Apologized to them for the delay in reaching out to them. Inform them we have no slot available today. Other option is they can do an e-visit.   Daughter inform they are in the middle of e-visit with provider. Has no further questions.

## 2023-08-02 ENCOUNTER — Encounter: Payer: Self-pay | Admitting: Primary Care

## 2023-08-05 ENCOUNTER — Ambulatory Visit: Payer: Medicare HMO | Admitting: Primary Care

## 2023-08-07 ENCOUNTER — Other Ambulatory Visit: Payer: Self-pay | Admitting: Family Medicine

## 2023-09-09 ENCOUNTER — Telehealth: Payer: Self-pay

## 2023-09-09 NOTE — Telephone Encounter (Signed)
 I called and spoke to a RT with [employer] due to not finding a compliance report on pt for OSA. [employer] stated they did not show any data since 2021. I sent the pt a Mychart message asking is she was doing or using anything to treat her osa. Awaiting response back.

## 2023-09-12 ENCOUNTER — Ambulatory Visit: Payer: Medicare HMO | Admitting: Primary Care

## 2023-09-12 ENCOUNTER — Encounter: Payer: Self-pay | Admitting: Primary Care

## 2023-10-12 ENCOUNTER — Telehealth: Payer: Self-pay

## 2023-10-12 NOTE — Telephone Encounter (Signed)
 I called and spoke to pt regarding her appointment tomorrow ( 10-13-2023) with Buelah Manis, NP. [facility] nor [employer] ( DME) could not find a compliance report on her for CPAP. When I spoke to pt, she stated she does not treat her OSA and her CPAP machine is in storage. I asked [pt if there was anything specific she wanted to discuss with the provider, and she stated she has had a cough and chest congestion and would like meds. I informed pt that the provider could determine medications at the appointment tomorrow and pt verbalized understanding. NFN

## 2023-10-13 ENCOUNTER — Ambulatory Visit: Admitting: Primary Care

## 2023-10-13 ENCOUNTER — Encounter: Payer: Self-pay | Admitting: Primary Care

## 2023-10-13 VITALS — BP 128/78 | HR 82 | Temp 97.7°F | Ht 63.0 in | Wt 161.8 lb

## 2023-10-13 DIAGNOSIS — R0609 Other forms of dyspnea: Secondary | ICD-10-CM | POA: Diagnosis not present

## 2023-10-13 DIAGNOSIS — R918 Other nonspecific abnormal finding of lung field: Secondary | ICD-10-CM | POA: Diagnosis not present

## 2023-10-13 DIAGNOSIS — R053 Chronic cough: Secondary | ICD-10-CM | POA: Diagnosis not present

## 2023-10-13 LAB — BRAIN NATRIURETIC PEPTIDE: Pro B Natriuretic peptide (BNP): 90 pg/mL (ref 0.0–100.0)

## 2023-10-13 MED ORDER — AZITHROMYCIN 250 MG PO TABS: ORAL_TABLET | ORAL | 0 refills | Status: DC

## 2023-10-13 MED ORDER — PREDNISONE 10 MG PO TABS: ORAL_TABLET | ORAL | 0 refills | Status: AC

## 2023-10-13 NOTE — Patient Instructions (Addendum)
 -  PULMONARY FIBROSIS: Pulmonary fibrosis is a condition where the lung tissue becomes scarred and stiff, making it difficult to breathe. Your CT scan shows scarring in your lungs, particularly in the left base, which has not worsened since June 2024. We will repeat your ANA and anti-DNA lab tests and refer you to a rheumatologist to rule out any connective tissue diseases. Additionally, we will schedule a pulmonary function test and follow up with [name] for further management of your lung condition.  -CHRONIC BRONCHITIS: Chronic bronchitis is a long-term inflammation of the airways in the lungs, leading to a persistent cough with mucus. To manage this, we have prescribed azithromycin for any potential bacterial infection and prednisone to reduce inflammation and wheezing. You should use your nebulizer 2-3 times daily and take Mucinex-DM 600mg  tablet twice daily. Please avoid combination medications like Vicks DayQuil.  INSTRUCTIONS: Please follow up with [name] for the management of your interstitial lung disease. Additionally, ensure you complete the repeat ANA and anti-DNA lab tests, and attend the scheduled pulmonary function test. We will also arrange a referral to a rheumatologist to rule out any connective tissue diseases.   Refer: Rheumatology re: elevate ANA/ ILD    Follow-up  Please schedule PFTs first available / 30 mins OV with [name] only after PFT
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
## 2023-10-14 LAB — ANTI-DNA ANTIBODY, DOUBLE-STRANDED: ds DNA Ab: 6 [IU]/mL — ABNORMAL HIGH

## 2023-10-14 LAB — ANA: Anti Nuclear Antibody (ANA): NEGATIVE

## 2023-10-17 NOTE — Progress Notes (Signed)
 ANA was negative. Anti DNA test for lupus was intermittent. Still recommend seeing rheumatology

## 2023-10-18 ENCOUNTER — Telehealth: Payer: Self-pay

## 2023-10-18 NOTE — Telephone Encounter (Signed)
 Called pt in regards to test result,no answer,LMTCB

## 2023-10-18 NOTE — Telephone Encounter (Signed)
-----   Message from Antonio Baumgarten sent at 10/17/2023  8:55 AM EDT ----- ANA was negative. Anti DNA test for lupus was intermittent. Still recommend seeing rheumatology

## 2023-11-07 ENCOUNTER — Encounter (HOSPITAL_BASED_OUTPATIENT_CLINIC_OR_DEPARTMENT_OTHER): Payer: Self-pay | Admitting: Internal Medicine

## 2023-11-07 ENCOUNTER — Encounter (HOSPITAL_BASED_OUTPATIENT_CLINIC_OR_DEPARTMENT_OTHER)

## 2023-11-16 ENCOUNTER — Encounter: Payer: Medicare HMO | Admitting: Family Medicine

## 2023-11-16 NOTE — Progress Notes (Deleted)
 Complete physical exam  Patient: Kaitlin Watkins   DOB: Mar 23, 1946   78 y.o. Female  MRN: [id number]  Subjective:     No chief complaint on file.   Kaitlin Watkins is a 78 y.o. female who presents today for a complete physical exam. She reports consuming a {diet types:17450} diet. {types:19826} She generally feels {DESC; WELL/FAIRLY WELL/POORLY:18703}. She reports sleeping {DESC; WELL/FAIRLY WELL/POORLY:18703}. She {does/does not:200015} have additional problems to discuss today.    Most recent fall risk assessment:    10/13/2023   11:38 AM  Fall Risk   Falls in the past year? 1  Number falls in past yr: 1  Injury with Fall? 1     Most recent depression screenings:    02/16/2023   11:45 AM 02/15/2023    1:01 PM  PHQ 2/9 Scores  PHQ - 2 Score 0 0  PHQ- 9 Score 3 2    {VISON DENTAL STD PSA (Optional):27386}  {History (Optional):23778}  Patient Care Team: Francenia Ingle, NP as PCP - General (Family Medicine)   Outpatient Medications Prior to Visit  Medication Sig   acetaminophen  (TYLENOL ) 500 MG tablet Take 500 mg by mouth every 6 (six) hours as needed for headache.   albuterol  (PROVENTIL ) (2.5 MG/3ML) 0.083% nebulizer solution Take 2.5 mg by nebulization every 6 (six) hours as needed for wheezing or shortness of breath.   aspirin  EC 81 MG tablet Take 81 mg by mouth at bedtime.   atorvastatin  (LIPITOR ) 80 MG tablet TAKE 1 TABLET BY MOUTH EVERY EVENING   azithromycin  (ZITHROMAX ) 250 MG tablet Take 2 tablets on day #1; then 1 tablet daily for additional 4 days   b complex vitamins capsule Take 1 capsule by mouth daily.   benzonatate  (TESSALON ) 100 MG capsule Take 1 capsule (100 mg total) by mouth 3 (three) times daily as needed.   ibuprofen (ADVIL) 200 MG tablet Take 200 mg by mouth every 6 (six) hours as needed for headache.   levocetirizine (XYZAL) 5 MG tablet Take 5 mg by mouth every evening.   losartan  (COZAAR ) 50 MG tablet TAKE 1 TABLET BY MOUTH EVERY DAY    Multiple Vitamin (MULTIVITAMIN WITH MINERALS) TABS tablet Take 1 tablet by mouth daily.   Polyvinyl Alcohol-Povidone (REFRESH OP) Place 1 drop into both eyes 2 (two) times daily as needed (dry eyes).   predniSONE  (DELTASONE ) 10 MG tablet 4 tabs for 2 days, then 3 tabs for 2 days, 2 tabs for 2 days, then 1 tab for 2 days, then stop   VITAMIN D , CHOLECALCIFEROL , PO Take 1 tablet by mouth daily at 6 (six) AM.   No facility-administered medications prior to visit.    ROS See HPI above       Objective:     There were no vitals taken for this visit. {Vitals History (Optional):23777}  Physical Exam   No results found for any visits on 11/16/23. {Show previous labs (optional):23779}    Assessment & Plan:    Routine Health Maintenance and Physical Exam  Immunization History  Administered Date(s) Administered   Influenza,inj,Quad PF,6+ Mos 08/25/2017   Influenza,inj,Quad PF,6-35 Mos 08/25/2017   PFIZER(Purple Top)SARS-COV-2 Vaccination 11/26/2019, 02/02/2020   Pneumococcal Conjugate-13 09/11/2013   Pneumococcal Polysaccharide-23 12/23/2014   Tdap 09/11/2013   Zoster Recombinant(Shingrix) 02/12/2019   Zoster, Live 07/05/2008, 03/31/2012    Health Maintenance  Topic Date Due   COVID-19 Vaccine (3 - 2024-25 season) 03/06/2023   DTaP/Tdap/Td (2 - Td or Tdap) 09/12/2023  Zoster Vaccines- Shingrix (2 of 2) 11/17/2023 (Originally 04/09/2019)   INFLUENZA VACCINE  02/03/2024   Medicare Annual Wellness (AWV)  02/16/2024   Pneumonia Vaccine 84+ Years old  Completed   DEXA SCAN  Completed   Hepatitis C Screening  Completed   HPV VACCINES  Aged Out   Meningococcal B Vaccine  Aged Out   Colonoscopy  Discontinued   Discussed health benefits of physical activity, and encouraged her to engage in regular exercise appropriate for her age and condition.  Essential hypertension, benign  Vitamin D  deficiency  Vitamin B12 deficiency  Prediabetes  Hyperlipidemia, unspecified  hyperlipidemia type  Annual physical exam  1.Review health maintenance:  -Covid booster: -Tdap vaccine:   No follow-ups on file.     Harleigh Civello, NP .Elyce Hams

## 2023-12-05 DIAGNOSIS — I10 Essential (primary) hypertension: Secondary | ICD-10-CM | POA: Diagnosis not present

## 2023-12-05 DIAGNOSIS — R0600 Dyspnea, unspecified: Secondary | ICD-10-CM | POA: Diagnosis not present

## 2023-12-05 DIAGNOSIS — E785 Hyperlipidemia, unspecified: Secondary | ICD-10-CM | POA: Diagnosis not present

## 2023-12-05 DIAGNOSIS — R5383 Other fatigue: Secondary | ICD-10-CM | POA: Diagnosis not present

## 2023-12-05 DIAGNOSIS — Z853 Personal history of malignant neoplasm of breast: Secondary | ICD-10-CM | POA: Diagnosis not present

## 2023-12-15 DIAGNOSIS — I10 Essential (primary) hypertension: Secondary | ICD-10-CM | POA: Diagnosis not present

## 2023-12-15 DIAGNOSIS — J189 Pneumonia, unspecified organism: Secondary | ICD-10-CM | POA: Diagnosis not present

## 2023-12-15 DIAGNOSIS — I110 Hypertensive heart disease with heart failure: Secondary | ICD-10-CM | POA: Diagnosis not present

## 2023-12-15 DIAGNOSIS — J45901 Unspecified asthma with (acute) exacerbation: Secondary | ICD-10-CM | POA: Diagnosis not present

## 2023-12-15 DIAGNOSIS — I951 Orthostatic hypotension: Secondary | ICD-10-CM | POA: Diagnosis not present

## 2023-12-15 DIAGNOSIS — I5032 Chronic diastolic (congestive) heart failure: Secondary | ICD-10-CM | POA: Diagnosis not present

## 2023-12-15 DIAGNOSIS — R0602 Shortness of breath: Secondary | ICD-10-CM | POA: Diagnosis not present

## 2023-12-15 DIAGNOSIS — K7400 Hepatic fibrosis, unspecified: Secondary | ICD-10-CM | POA: Diagnosis not present

## 2023-12-15 DIAGNOSIS — I081 Rheumatic disorders of both mitral and tricuspid valves: Secondary | ICD-10-CM | POA: Diagnosis not present

## 2023-12-15 DIAGNOSIS — I509 Heart failure, unspecified: Secondary | ICD-10-CM | POA: Diagnosis not present

## 2023-12-15 DIAGNOSIS — R269 Unspecified abnormalities of gait and mobility: Secondary | ICD-10-CM | POA: Diagnosis not present

## 2023-12-15 DIAGNOSIS — E538 Deficiency of other specified B group vitamins: Secondary | ICD-10-CM | POA: Diagnosis not present

## 2023-12-15 DIAGNOSIS — R0609 Other forms of dyspnea: Secondary | ICD-10-CM | POA: Diagnosis not present

## 2023-12-15 DIAGNOSIS — R0902 Hypoxemia: Secondary | ICD-10-CM | POA: Diagnosis not present

## 2023-12-15 DIAGNOSIS — R590 Localized enlarged lymph nodes: Secondary | ICD-10-CM | POA: Diagnosis not present

## 2023-12-15 DIAGNOSIS — R0689 Other abnormalities of breathing: Secondary | ICD-10-CM | POA: Diagnosis not present

## 2023-12-15 DIAGNOSIS — I288 Other diseases of pulmonary vessels: Secondary | ICD-10-CM | POA: Diagnosis not present

## 2023-12-15 DIAGNOSIS — R0600 Dyspnea, unspecified: Secondary | ICD-10-CM | POA: Diagnosis not present

## 2023-12-15 DIAGNOSIS — J8410 Pulmonary fibrosis, unspecified: Secondary | ICD-10-CM | POA: Diagnosis not present

## 2023-12-15 DIAGNOSIS — R42 Dizziness and giddiness: Secondary | ICD-10-CM | POA: Diagnosis not present

## 2023-12-15 DIAGNOSIS — R918 Other nonspecific abnormal finding of lung field: Secondary | ICD-10-CM | POA: Diagnosis not present

## 2023-12-16 ENCOUNTER — Other Ambulatory Visit: Payer: Self-pay | Admitting: Family Medicine

## 2023-12-16 DIAGNOSIS — R0600 Dyspnea, unspecified: Secondary | ICD-10-CM | POA: Diagnosis not present

## 2023-12-16 DIAGNOSIS — J45901 Unspecified asthma with (acute) exacerbation: Secondary | ICD-10-CM | POA: Diagnosis not present

## 2023-12-16 DIAGNOSIS — I10 Essential (primary) hypertension: Secondary | ICD-10-CM

## 2023-12-16 DIAGNOSIS — R269 Unspecified abnormalities of gait and mobility: Secondary | ICD-10-CM | POA: Diagnosis not present

## 2023-12-16 DIAGNOSIS — R0609 Other forms of dyspnea: Secondary | ICD-10-CM | POA: Diagnosis not present

## 2023-12-16 DIAGNOSIS — J8410 Pulmonary fibrosis, unspecified: Secondary | ICD-10-CM | POA: Diagnosis not present

## 2023-12-16 DIAGNOSIS — R0689 Other abnormalities of breathing: Secondary | ICD-10-CM | POA: Diagnosis not present

## 2023-12-16 DIAGNOSIS — R42 Dizziness and giddiness: Secondary | ICD-10-CM | POA: Diagnosis not present

## 2023-12-17 DIAGNOSIS — J45901 Unspecified asthma with (acute) exacerbation: Secondary | ICD-10-CM | POA: Diagnosis not present

## 2023-12-17 DIAGNOSIS — R269 Unspecified abnormalities of gait and mobility: Secondary | ICD-10-CM | POA: Diagnosis not present

## 2023-12-17 DIAGNOSIS — R0689 Other abnormalities of breathing: Secondary | ICD-10-CM | POA: Diagnosis not present

## 2023-12-17 DIAGNOSIS — J8410 Pulmonary fibrosis, unspecified: Secondary | ICD-10-CM | POA: Diagnosis not present

## 2023-12-17 DIAGNOSIS — R0609 Other forms of dyspnea: Secondary | ICD-10-CM | POA: Diagnosis not present

## 2023-12-17 DIAGNOSIS — R42 Dizziness and giddiness: Secondary | ICD-10-CM | POA: Diagnosis not present

## 2023-12-17 DIAGNOSIS — R0600 Dyspnea, unspecified: Secondary | ICD-10-CM | POA: Diagnosis not present

## 2023-12-17 DIAGNOSIS — I10 Essential (primary) hypertension: Secondary | ICD-10-CM | POA: Diagnosis not present

## 2023-12-17 DIAGNOSIS — J189 Pneumonia, unspecified organism: Secondary | ICD-10-CM | POA: Diagnosis not present

## 2023-12-20 ENCOUNTER — Encounter: Payer: Self-pay | Admitting: Internal Medicine

## 2023-12-20 ENCOUNTER — Encounter: Admitting: Internal Medicine

## 2023-12-20 NOTE — Patient Instructions (Signed)
 -  PULMONARY FIBROSIS: Pulmonary fibrosis is a condition where the lung tissue becomes scarred and stiff, making it difficult to breathe. Your CT scan shows scarring in your lungs, particularly in the left base, which has not worsened since June 2024. We will repeat your ANA and anti-DNA lab tests and refer you to a rheumatologist to rule out any connective tissue diseases. Additionally, we will schedule a pulmonary function test and follow up with [name] for further management of your lung condition.  -CHRONIC BRONCHITIS: Chronic bronchitis is a long-term inflammation of the airways in the lungs, leading to a persistent cough with mucus. To manage this, we have prescribed azithromycin for any potential bacterial infection and prednisone to reduce inflammation and wheezing. You should use your nebulizer 2-3 times daily and take Mucinex-DM 600mg  tablet twice daily. Please avoid combination medications like Vicks DayQuil.  INSTRUCTIONS: Please follow up with [name] for the management of your interstitial lung disease. Additionally, ensure you complete the repeat ANA and anti-DNA lab tests, and attend the scheduled pulmonary function test. We will also arrange a referral to a rheumatologist to rule out any connective tissue diseases.   Refer: Rheumatology re: elevate ANA/ ILD    Follow-up  Please schedule PFTs first available / 30 mins OV with [name] only after PFT

## 2023-12-20 NOTE — Progress Notes (Signed)
 This encounter was created in error - please disregard.

## 2023-12-26 ENCOUNTER — Other Ambulatory Visit (HOSPITAL_COMMUNITY): Payer: Self-pay | Admitting: Internal Medicine

## 2023-12-26 DIAGNOSIS — Z1231 Encounter for screening mammogram for malignant neoplasm of breast: Secondary | ICD-10-CM

## 2024-01-02 ENCOUNTER — Ambulatory Visit (HOSPITAL_COMMUNITY)
Admission: RE | Admit: 2024-01-02 | Discharge: 2024-01-02 | Disposition: A | Discharge status: Home / Self Care | Source: Ambulatory Visit | Attending: Internal Medicine | Admitting: Internal Medicine

## 2024-01-02 DIAGNOSIS — Z1231 Encounter for screening mammogram for malignant neoplasm of breast: Secondary | ICD-10-CM | POA: Diagnosis not present

## 2024-01-09 DIAGNOSIS — Z8701 Personal history of pneumonia (recurrent): Secondary | ICD-10-CM | POA: Diagnosis not present

## 2024-01-09 DIAGNOSIS — R918 Other nonspecific abnormal finding of lung field: Secondary | ICD-10-CM | POA: Diagnosis not present

## 2024-01-09 DIAGNOSIS — G4733 Obstructive sleep apnea (adult) (pediatric): Secondary | ICD-10-CM | POA: Diagnosis not present

## 2024-01-09 DIAGNOSIS — R0602 Shortness of breath: Secondary | ICD-10-CM | POA: Diagnosis not present

## 2024-01-09 DIAGNOSIS — I10 Essential (primary) hypertension: Secondary | ICD-10-CM | POA: Diagnosis not present

## 2024-01-09 DIAGNOSIS — R1310 Dysphagia, unspecified: Secondary | ICD-10-CM | POA: Diagnosis not present

## 2024-01-09 DIAGNOSIS — R9389 Abnormal findings on diagnostic imaging of other specified body structures: Secondary | ICD-10-CM | POA: Diagnosis not present

## 2024-01-24 DIAGNOSIS — G4733 Obstructive sleep apnea (adult) (pediatric): Secondary | ICD-10-CM | POA: Diagnosis not present

## 2024-01-25 DIAGNOSIS — R768 Other specified abnormal immunological findings in serum: Secondary | ICD-10-CM | POA: Diagnosis not present

## 2024-01-25 DIAGNOSIS — J849 Interstitial pulmonary disease, unspecified: Secondary | ICD-10-CM | POA: Diagnosis not present

## 2024-01-25 DIAGNOSIS — I10 Essential (primary) hypertension: Secondary | ICD-10-CM | POA: Diagnosis not present

## 2024-02-06 DIAGNOSIS — J849 Interstitial pulmonary disease, unspecified: Secondary | ICD-10-CM | POA: Diagnosis not present

## 2024-02-06 DIAGNOSIS — R1319 Other dysphagia: Secondary | ICD-10-CM | POA: Diagnosis not present

## 2024-02-06 DIAGNOSIS — I10 Essential (primary) hypertension: Secondary | ICD-10-CM | POA: Diagnosis not present

## 2024-02-13 DIAGNOSIS — G4733 Obstructive sleep apnea (adult) (pediatric): Secondary | ICD-10-CM | POA: Diagnosis not present

## 2024-02-13 DIAGNOSIS — J479 Bronchiectasis, uncomplicated: Secondary | ICD-10-CM | POA: Diagnosis not present

## 2024-02-13 DIAGNOSIS — J9611 Chronic respiratory failure with hypoxia: Secondary | ICD-10-CM | POA: Diagnosis not present

## 2024-02-13 DIAGNOSIS — I10 Essential (primary) hypertension: Secondary | ICD-10-CM | POA: Diagnosis not present

## 2024-02-13 DIAGNOSIS — J849 Interstitial pulmonary disease, unspecified: Secondary | ICD-10-CM | POA: Diagnosis not present

## 2024-02-13 DIAGNOSIS — R1310 Dysphagia, unspecified: Secondary | ICD-10-CM | POA: Diagnosis not present

## 2024-02-13 DIAGNOSIS — Z79899 Other long term (current) drug therapy: Secondary | ICD-10-CM | POA: Diagnosis not present

## 2024-02-13 DIAGNOSIS — I5032 Chronic diastolic (congestive) heart failure: Secondary | ICD-10-CM | POA: Diagnosis not present

## 2024-03-06 DIAGNOSIS — I10 Essential (primary) hypertension: Secondary | ICD-10-CM | POA: Diagnosis not present

## 2024-03-06 DIAGNOSIS — R0600 Dyspnea, unspecified: Secondary | ICD-10-CM | POA: Diagnosis not present

## 2024-03-06 DIAGNOSIS — E785 Hyperlipidemia, unspecified: Secondary | ICD-10-CM | POA: Diagnosis not present

## 2024-03-19 ENCOUNTER — Encounter: Admitting: Internal Medicine
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
## 2024-03-30 ENCOUNTER — Other Ambulatory Visit: Payer: Self-pay

## 2024-03-30 DIAGNOSIS — Z7982 Long term (current) use of aspirin: Secondary | ICD-10-CM | POA: Insufficient documentation

## 2024-03-30 DIAGNOSIS — S61211A Laceration without foreign body of left index finger without damage to nail, initial encounter: Secondary | ICD-10-CM | POA: Insufficient documentation

## 2024-03-30 DIAGNOSIS — W260XXA Contact with knife, initial encounter: Secondary | ICD-10-CM | POA: Insufficient documentation

## 2024-03-30 NOTE — ED Triage Notes (Signed)
 Pt POV after cutting L index finger with knife, bleeding controlled PTA, on thinners.

## 2024-03-31 ENCOUNTER — Emergency Department (HOSPITAL_BASED_OUTPATIENT_CLINIC_OR_DEPARTMENT_OTHER)
Admission: EM | Admit: 2024-03-31 | Discharge: 2024-03-31 | Disposition: A | Discharge status: Home / Self Care | Attending: Emergency Medicine | Admitting: Emergency Medicine

## 2024-03-31 ENCOUNTER — Emergency Department (HOSPITAL_BASED_OUTPATIENT_CLINIC_OR_DEPARTMENT_OTHER)

## 2024-03-31 DIAGNOSIS — S61211A Laceration without foreign body of left index finger without damage to nail, initial encounter: Secondary | ICD-10-CM

## 2024-03-31 MED ORDER — BACITRACIN ZINC 500 UNIT/GM EX OINT
TOPICAL_OINTMENT | CUTANEOUS | Status: AC
  Filled 2024-03-31: qty 28.35

## 2024-03-31 MED ORDER — LIDOCAINE HCL (PF) 1 % IJ SOLN
5.0000 mL | Freq: Once | INTRAMUSCULAR | Status: AC
  Administered 2024-03-31: 5 mL

## 2024-03-31 MED ORDER — LIDOCAINE HCL (PF) 1 % IJ SOLN
INTRAMUSCULAR | Status: AC
  Filled 2024-03-31: qty 5

## 2024-03-31 MED ORDER — BACITRACIN ZINC 500 UNIT/GM EX OINT: TOPICAL_OINTMENT | CUTANEOUS | Status: AC

## 2024-03-31 NOTE — ED Provider Notes (Signed)
 [place] EMERGENCY DEPARTMENT AT [facility] Provider Note   CSN: [id number] Arrival date & time: 03/30/24  2320     Patient presents with: Laceration   Kaitlin Watkins is a 78 y.o. female.   Patient is an 78 year old female presenting with a left index finger laceration.  She reports getting a new set of knives and accidentally cut herself.  She has a laceration to the index finger of the left hand measuring approximately 2.5 cm.  Bleeding controlled with direct pressure.       Prior to Admission medications   Medication Sig Start Date End Date Taking? Authorizing Provider  acetaminophen  (TYLENOL ) 500 MG tablet Take 500 mg by mouth every 6 (six) hours as needed for headache.    [provider]  albuterol  (PROVENTIL ) (2.5 MG/3ML) 0.083% nebulizer solution Take 2.5 mg by nebulization every 6 (six) hours as needed for wheezing or shortness of breath.    [provider]  aspirin  EC 81 MG tablet Take 81 mg by mouth at bedtime.    [provider]  atorvastatin  (LIPITOR ) 80 MG tablet TAKE 1 TABLET BY MOUTH EVERY EVENING 08/07/23   Webb, Padonda B, FNP  azithromycin  (ZITHROMAX ) 250 MG tablet Take 2 tablets on day #1; then 1 tablet daily for additional 4 days 10/13/23   Hope Almarie ORN, NP  b complex vitamins capsule Take 1 capsule by mouth daily.    [provider]  benzonatate  (TESSALON ) 100 MG capsule Take 1 capsule (100 mg total) by mouth 3 (three) times daily as needed. 07/22/23   Kennyth Domino, FNP  ibuprofen (ADVIL) 200 MG tablet Take 200 mg by mouth every 6 (six) hours as needed for headache.    [provider]  levocetirizine (XYZAL) 5 MG tablet Take 5 mg by mouth every evening.    [provider]  losartan  (COZAAR ) 50 MG tablet TAKE 1 TABLET BY MOUTH EVERY DAY 12/16/23   Williamson, Joanna R, NP  Multiple Vitamin (MULTIVITAMIN WITH MINERALS) TABS tablet Take 1 tablet by mouth daily.    [provider]   Polyvinyl Alcohol-Povidone (REFRESH OP) Place 1 drop into both eyes 2 (two) times daily as needed (dry eyes).    [provider]  predniSONE  (DELTASONE ) 10 MG tablet 4 tabs for 2 days, then 3 tabs for 2 days, 2 tabs for 2 days, then 1 tab for 2 days, then stop 10/13/23   Hope Almarie ORN, NP  VITAMIN D , CHOLECALCIFEROL , PO Take 1 tablet by mouth daily at 6 (six) AM.    [provider]    Allergies: Influenza a (h1n1) monoval vac, Prolia [denosumab], Egg-derived products, and Influenza vaccines    Review of Systems  All other systems reviewed and are negative.   Updated Vital Signs BP 104/77 (BP Location: Right Arm)   Pulse 82   Temp 98.7 F (37.1 C) (Oral)   Resp 18   Ht 5' 3 (1.6 m)   Wt 77.1 kg   SpO2 96%   BMI 30.11 kg/m   Physical Exam Vitals and nursing note reviewed.  Constitutional:      Appearance: Normal appearance.  HENT:     Head: Normocephalic.  Pulmonary:     Effort: Pulmonary effort is normal.  Musculoskeletal:     Comments: There is a 2.5 cm laceration to the left index finger.  The laceration runs parallel with the finger extending from the lateral aspect of the finger pad just past the DIP joint.  There  is no tendon involvement and she has full range of motion of the finger.  Skin:    General: Skin is warm and dry.  Neurological:     Mental Status: She is alert and oriented to person, place, and time.     (all labs ordered are listed, but only abnormal results are displayed) Labs Reviewed - No data to display  EKG: None  Radiology: No results found.   Procedures   LACERATION REPAIR Performed by: Vicenta Able Authorized by: Vicenta Able Consent: Verbal consent obtained. Risks and benefits: risks, benefits and alternatives were discussed Consent given by: patient Patient identity confirmed: provided demographic data Prepped and Draped in normal sterile fashion Wound explored  Laceration Location: Left index  finger  Laceration Length: 2.5 cm  No Foreign Bodies seen or palpated  Anesthesia: local infiltration  Local anesthetic: lidocaine  1% without epinephrine   Anesthetic total: 2 ml  Irrigation method: syringe Amount of cleaning: standard  Skin closure: 4-0 Prolene  Number of sutures: 5  Technique: Simple interrupted  Patient tolerance: Patient tolerated the procedure well with no immediate complications.   Medications Ordered in the ED  lidocaine  (PF) (XYLOCAINE ) 1 % injection 5 mL (5 mLs Other Given by Other 03/31/24 0217)                                    Medical Decision Making Risk Prescription drug management.   Patient presenting with a laceration to her left index finger as described in the HPI.  The laceration was repaired as per procedure note.  Patient will be discharged with local wound care and suture removal in 1 week.     Final diagnoses:  None    ED Discharge Orders     None          Able Vicenta, MD 03/31/24 0230

## 2024-03-31 NOTE — Discharge Instructions (Signed)
 Local wound care with bacitracin  and dressing changes twice daily.  Sutures are to be removed in 1 week.  Please follow-up with your primary doctor for this.  Return to the ER in the meantime if you develop pus draining from the wound, increased pain, streaks extending up the hand and forearm, or for other new and concerning symptoms.

## 2024-04-11 VITALS — Ht 63.0 in | Wt 158.0 lb
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
## 2024-04-11 NOTE — Patient Instructions (Signed)
 Kaitlin Watkins,  Thank you for taking the time for your Medicare Wellness Visit. I appreciate your continued commitment to your health goals. Please review the care plan we discussed, and feel free to reach out if I can assist you further.  Medicare recommends these wellness visits once per year to help you and your care team stay ahead of potential health issues. These visits are designed to focus on prevention, allowing your provider to concentrate on managing your acute and chronic conditions during your regular appointments.  Please note that Annual Wellness Visits do not include a physical exam. Some assessments may be limited, especially if the visit was conducted virtually. If needed, we may recommend a separate in-person follow-up with your provider.  Ongoing Care Seeing your primary care provider every 3 to 6 months helps us  monitor your health and provide consistent, personalized care.   Referrals If a referral was made during today's visit and you haven't received any updates within two weeks, please contact the referred provider directly to check on the status.  Recommended Screenings:  Health Maintenance  Topic Date Due   Zoster (Shingles) Vaccine (2 of 2) 04/09/2019   COVID-19 Vaccine (3 - Pfizer risk series) 03/01/2020   DTaP/Tdap/Td vaccine (2 - Td or Tdap) 09/12/2023   Flu Shot  02/03/2024   Medicare Annual Wellness Visit  04/11/2025   Pneumococcal Vaccine for age over 93  Completed   DEXA scan (bone density measurement)  Completed   Meningitis B Vaccine  Aged Out   Breast Cancer Screening  Discontinued   Colon Cancer Screening  Discontinued   Hepatitis C Screening  Discontinued       03/30/2024   11:42 PM  Advanced Directives  Does Patient Have a Medical Advance Directive? No   Advance Care Planning is important because it: Ensures you receive medical care that aligns with your values, goals, and preferences. Provides guidance to your family and loved ones,  reducing the emotional burden of decision-making during critical moments.  Vision: Annual vision screenings are recommended for early detection of glaucoma, cataracts, and diabetic retinopathy. These exams can also reveal signs of chronic conditions such as diabetes and high blood pressure.  Dental: Annual dental screenings help detect early signs of oral cancer, gum disease, and other conditions linked to overall health, including heart disease and diabetes.  Please see the attached documents for additional preventive care recommendations.

## 2024-05-28 ENCOUNTER — Ambulatory Visit: Admitting: Family Medicine

## 2024-06-05 ENCOUNTER — Emergency Department (HOSPITAL_COMMUNITY)

## 2024-06-05 ENCOUNTER — Other Ambulatory Visit: Payer: Self-pay

## 2024-06-05 ENCOUNTER — Encounter (HOSPITAL_COMMUNITY): Payer: Self-pay

## 2024-06-05 ENCOUNTER — Emergency Department (HOSPITAL_COMMUNITY)
Admission: EM | Admit: 2024-06-05 | Discharge: 2024-06-05 | Disposition: A | Discharge status: Home / Self Care | Attending: Emergency Medicine | Admitting: Emergency Medicine

## 2024-06-05 DIAGNOSIS — J181 Lobar pneumonia, unspecified organism: Secondary | ICD-10-CM | POA: Insufficient documentation

## 2024-06-05 DIAGNOSIS — Z79899 Other long term (current) drug therapy: Secondary | ICD-10-CM | POA: Diagnosis not present

## 2024-06-05 DIAGNOSIS — Z7982 Long term (current) use of aspirin: Secondary | ICD-10-CM | POA: Diagnosis not present

## 2024-06-05 DIAGNOSIS — J189 Pneumonia, unspecified organism: Secondary | ICD-10-CM

## 2024-06-05 DIAGNOSIS — R0602 Shortness of breath: Secondary | ICD-10-CM | POA: Diagnosis present

## 2024-06-05 LAB — CBC
HCT: 39.9 % (ref 36.0–46.0)
Hemoglobin: 12.6 g/dL (ref 12.0–15.0)
MCH: 27.9 pg (ref 26.0–34.0)
MCHC: 31.6 g/dL (ref 30.0–36.0)
MCV: 88.3 fL (ref 80.0–100.0)
Platelets: 436 K/uL — ABNORMAL HIGH (ref 150–400)
RBC: 4.52 MIL/uL (ref 3.87–5.11)
RDW: 13.2 % (ref 11.5–15.5)
WBC: 8.5 K/uL (ref 4.0–10.5)
nRBC: 0 % (ref 0.0–0.2)

## 2024-06-05 LAB — URINALYSIS, W/ REFLEX TO CULTURE (INFECTION SUSPECTED)
Bacteria, UA: NONE SEEN
Bilirubin Urine: NEGATIVE
Glucose, UA: NEGATIVE mg/dL
Hgb urine dipstick: NEGATIVE
Ketones, ur: NEGATIVE mg/dL
Nitrite: NEGATIVE
Protein, ur: NEGATIVE mg/dL
Specific Gravity, Urine: 1.026 (ref 1.005–1.030)
pH: 5 (ref 5.0–8.0)

## 2024-06-05 LAB — COMPREHENSIVE METABOLIC PANEL WITH GFR
ALT: 12 U/L (ref 0–44)
AST: 22 U/L (ref 15–41)
Albumin: 3 g/dL — ABNORMAL LOW (ref 3.5–5.0)
Alkaline Phosphatase: 65 U/L (ref 38–126)
Anion gap: 11 (ref 5–15)
BUN: 11 mg/dL (ref 8–23)
CO2: 23 mmol/L (ref 22–32)
Calcium: 8.7 mg/dL — ABNORMAL LOW (ref 8.9–10.3)
Chloride: 104 mmol/L (ref 98–111)
Creatinine, Ser: 0.8 mg/dL (ref 0.44–1.00)
GFR, Estimated: >60 mL/min (ref >60)
Glucose, Bld: 128 mg/dL — ABNORMAL HIGH (ref 70–99)
Potassium: 4.1 mmol/L (ref 3.5–5.1)
Sodium: 138 mmol/L (ref 135–145)
Total Bilirubin: 0.5 mg/dL (ref 0.0–1.2)
Total Protein: 6.2 g/dL — ABNORMAL LOW (ref 6.5–8.1)

## 2024-06-05 LAB — TROPONIN I (HIGH SENSITIVITY)
Troponin I (High Sensitivity): 5 ng/L (ref <18)
Troponin I (High Sensitivity): 5 ng/L (ref <18)

## 2024-06-05 LAB — I-STAT CG4 LACTIC ACID, ED
Lactic Acid, Venous: 1.8 mmol/L (ref 0.5–1.9)
Lactic Acid, Venous: 0.9 mmol/L (ref 0.5–1.9)

## 2024-06-05 LAB — BRAIN NATRIURETIC PEPTIDE: B Natriuretic Peptide: 94.5 pg/mL (ref 0.0–100.0)

## 2024-06-05 LAB — D-DIMER, QUANTITATIVE: D-Dimer, Quant: 0.42 ug{FEU}/mL (ref 0.00–0.50)

## 2024-06-05 MED ORDER — AMOXICILLIN-POT CLAVULANATE 875-125 MG PO TABS: 1.0000 | ORAL_TABLET | Freq: Two times a day (BID) | ORAL | 0 refills | Status: DC

## 2024-06-05 MED ORDER — SODIUM CHLORIDE 0.9 % IV SOLN
500.0000 mg | Freq: Once | INTRAVENOUS | Status: AC
  Administered 2024-06-05: 500 mg via INTRAVENOUS
  Filled 2024-06-05: qty 5

## 2024-06-05 MED ORDER — AZITHROMYCIN 250 MG PO TABS: 250.0000 mg | ORAL_TABLET | Freq: Every day | ORAL | 0 refills | Status: DC

## 2024-06-05 MED ORDER — ALBUTEROL SULFATE (2.5 MG/3ML) 0.083% IN NEBU
2.5000 mg | INHALATION_SOLUTION | Freq: Once | RESPIRATORY_TRACT | Status: AC
  Administered 2024-06-05: 2.5 mg via RESPIRATORY_TRACT
  Filled 2024-06-05: qty 3

## 2024-06-05 MED ORDER — METHYLPREDNISOLONE SODIUM SUCC 125 MG IJ SOLR
125.0000 mg | Freq: Once | INTRAMUSCULAR | Status: AC
  Administered 2024-06-05: 125 mg via INTRAVENOUS
  Filled 2024-06-05: qty 2

## 2024-06-05 MED ORDER — AMOXICILLIN-POT CLAVULANATE 875-125 MG PO TABS
1.0000 | ORAL_TABLET | Freq: Once | ORAL | Status: AC
  Administered 2024-06-05: 1 via ORAL
  Filled 2024-06-05: qty 1

## 2024-06-05 NOTE — ED Notes (Signed)
 Family at bedside.

## 2024-06-05 NOTE — ED Triage Notes (Signed)
 Sent by PCP. BIB family from home for sob, productive cough, HA and fever. Finished course of amoxicillin for PNA. No change or improvement. Pt alert, NAD, calm, interactive, winded, some increased wob, speaking in shortened phrases. Rates HA 5/10. Sx ongoing 2 weeks.

## 2024-06-05 NOTE — ED Triage Notes (Signed)
 Patient BIB sister from home for no improvement in cough, SHOB, and increase in O2 needs since being treated for URI with Augmentin. Patient also prescribed cipro for UTI but declined to take it. Patient has hx of pulmonary fibrosis and wears 2L O2 PRN.

## 2024-06-05 NOTE — Discharge Instructions (Addendum)
 With tonight's diagnosis of pneumonia it is very important to follow-up with your physician and to monitor your condition carefully.  Do not hesitate to return here for concerning changes in your condition.

## 2024-06-05 NOTE — ED Provider Notes (Signed)
 [place] [facility] AT [facility] Provider Note   CSN: [id number] Arrival date & time: 06/05/24  1442     Patient presents with: Shortness of Breath   Kaitlin Watkins is a 78 y.o. female.   HPI Sent by PCP. BIB family from home for sob, productive cough, HA and fever. Finished course of amoxicillin for PNA. No change or improvement. Pt alert, NAD, calm, interactive, winded, some increased wob, speaking in shortened phrases. Rates HA 5/10. Sx ongoing 2 weeks.     Prior to Admission medications   Medication Sig Start Date End Date Taking? Authorizing Provider  amoxicillin-clavulanate (AUGMENTIN) 875-125 MG tablet Take 1 tablet by mouth every 12 (twelve) hours. 06/05/24  Yes Garrick Charleston, MD  azithromycin  (ZITHROMAX ) 250 MG tablet Take 1 tablet (250 mg total) by mouth daily for 4 days. Take 1 every day until finished. 06/05/24 06/09/24 Yes Garrick Charleston, MD  acetaminophen  (TYLENOL ) 500 MG tablet Take 500 mg by mouth every 6 (six) hours as needed for headache.    [provider]  albuterol  (PROVENTIL ) (2.5 MG/3ML) 0.083% nebulizer solution Take 2.5 mg by nebulization every 6 (six) hours as needed for wheezing or shortness of breath.    [provider]  aspirin  EC 81 MG tablet Take 81 mg by mouth at bedtime.    [provider]  atorvastatin  (LIPITOR ) 80 MG tablet TAKE 1 TABLET BY MOUTH EVERY EVENING 08/07/23   Webb, Padonda B, FNP  b complex vitamins capsule Take 1 capsule by mouth daily.    [provider]  benzonatate  (TESSALON ) 100 MG capsule Take 1 capsule (100 mg total) by mouth 3 (three) times daily as needed. Patient not taking: Reported on 04/11/2024 07/22/23   Kennyth Domino, FNP  ibuprofen (ADVIL) 200 MG tablet Take 200 mg by mouth every 6 (six) hours as needed for headache.    [provider]  levocetirizine (XYZAL) 5 MG tablet Take 5 mg by mouth every evening.    [provider]  losartan  (COZAAR ) 50  MG tablet TAKE 1 TABLET BY MOUTH EVERY DAY 12/16/23   Williamson, Joanna R, NP  Multiple Vitamin (MULTIVITAMIN WITH MINERALS) TABS tablet Take 1 tablet by mouth daily.    [provider]  Polyvinyl Alcohol-Povidone (REFRESH OP) Place 1 drop into both eyes 2 (two) times daily as needed (dry eyes).    [provider]  predniSONE  (DELTASONE ) 10 MG tablet 4 tabs for 2 days, then 3 tabs for 2 days, 2 tabs for 2 days, then 1 tab for 2 days, then stop Patient not taking: Reported on 04/11/2024 10/13/23   Hope Almarie ORN, NP  VITAMIN D , CHOLECALCIFEROL , PO Take 1 tablet by mouth daily at 6 (six) AM.    [provider]    Allergies: Influenza a (h1n1) monoval vac, Prolia [denosumab], Egg protein-containing drug products, and Influenza vaccines    Review of Systems  Updated Vital Signs BP 120/72   Pulse 84   Temp 98.4 F (36.9 C) (Oral)   Resp 20   Ht 1.6 m (5' 3)   Wt 70.3 kg   SpO2 100%   BMI 27.46 kg/m   Physical Exam Vitals and nursing note reviewed.  Constitutional:      General: She is not in acute distress.    Appearance: She is well-developed.  HENT:     Head: Normocephalic and atraumatic.  Eyes:     Conjunctiva/sclera: Conjunctivae normal.  Cardiovascular:     Rate and  Rhythm: Normal rate and regular rhythm.  Pulmonary:     Effort: Tachypnea and accessory muscle usage present.     Breath sounds: Decreased breath sounds present.  Abdominal:     General: There is no distension.  Skin:    General: Skin is warm and dry.  Neurological:     Mental Status: She is alert and oriented to person, place, and time.     Cranial Nerves: No cranial nerve deficit.  Psychiatric:        Mood and Affect: Mood normal.     (all labs ordered are listed, but only abnormal results are displayed) Labs Reviewed  CBC - Abnormal; Notable for the following components:      Result Value   Platelets 436 (*)    All other components within normal limits  COMPREHENSIVE  METABOLIC PANEL WITH GFR - Abnormal; Notable for the following components:   Glucose, Bld 128 (*)    Calcium  8.7 (*)    Total Protein 6.2 (*)    Albumin  3.0 (*)    All other components within normal limits  URINALYSIS, W/ REFLEX TO CULTURE (INFECTION SUSPECTED) - Abnormal; Notable for the following components:   Color, Urine AMBER (*)    APPearance HAZY (*)    Leukocytes,Ua TRACE (*)    All other components within normal limits  BRAIN NATRIURETIC PEPTIDE  D-DIMER, QUANTITATIVE  I-STAT CG4 LACTIC ACID, ED  I-STAT CG4 LACTIC ACID, ED  TROPONIN I (HIGH SENSITIVITY)  TROPONIN I (HIGH SENSITIVITY)    EKG: EKG Interpretation Date/Time:  Tuesday June 05 2024 16:04:03 EST Ventricular Rate:  99 PR Interval:  134 QRS Duration:  84 QT Interval:  336 QTC Calculation: 431 R Axis:   14  Text Interpretation: Normal sinus rhythm Anterior injury pattern ST-t wave abnormality Artifact Abnormal ECG Confirmed by Garrick Charleston [phone]) on 06/05/2024 4:18:02 PM  Radiology: DG Chest 2 View Result Date: 06/05/2024 CLINICAL DATA:  Shortness of breath.  Productive cough.  Fever. EXAM: CHEST - 2 VIEW COMPARISON:  Radiograph 12/13/2022, CT 05/12/2023 FINDINGS: Stable heart size and mediastinal contours. Aortic atherosclerosis. Chronic interstitial coarsening with honeycombing at the bases consistent with interstitial lung disease. There is mild superimposed patchy opacity within both upper lung zones and the right lung base. No pleural effusion or pneumothorax. No acute osseous findings. IMPRESSION: Chronic interstitial lung disease. Mild superimposed patchy opacity within both upper lung zones and the right lung base, suspicious for pneumonia. Electronically Signed   By: Andrea Gasman M.D.   On: 06/05/2024 16:52     Procedures   Medications Ordered in the ED  albuterol  (PROVENTIL ) (2.5 MG/3ML) 0.083% nebulizer solution 2.5 mg (2.5 mg Nebulization Given 06/05/24 1759)  methylPREDNISolone  sodium  succinate (SOLU-MEDROL ) 125 mg/2 mL injection 125 mg (125 mg Intravenous Given 06/05/24 1803)  azithromycin  (ZITHROMAX ) 500 mg in sodium chloride  0.9 % 250 mL IVPB (0 mg Intravenous Stopped 06/05/24 2122)  amoxicillin-clavulanate (AUGMENTIN) 875-125 MG per tablet 1 tablet (1 tablet Oral Given 06/05/24 2010)                                    Medical Decision Making Adult female with history of pulmonary fibrosis presents with dyspnea, fatigue.  Description of dyspnea worse with exertion, fatigue suggestive of progression of disease, though other phenomena including pneumonia, pulmonary embolism considered. Given the duration of symptoms, ACS less likely. Patient accompanied by her sister. Cardiac 85  sinus normal pulse ox 100% room air at rest, though the patient typically wears 2 L via nasal cannula.   Amount and/or Complexity of Data Reviewed Independent Historian:     Details: Sister at bedside External Data Reviewed: notes. Labs: ordered. Decision-making details documented in ED Course. Radiology: ordered and independent interpretation performed. Decision-making details documented in ED Course. ECG/medicine tests: ordered and independent interpretation performed. Decision-making details documented in ED Course.  Risk Prescription drug management.   Update: Patient without oxygen requirement, though she does have some shortness of breath ambulation, this is typical for her. D-dimer negative, troponin unremarkable, no leukocytosis. X-ray does suggest pneumonia, though with her history of pulmonary disease, she has started antibiotics here. With no new oxygen requirement, no evidence for pulmonary embolism, no evidence of bacteremia, sepsis, patient will follow-up with primary care.     Final diagnoses:  Community acquired pneumonia of right lower lobe of lung    ED Discharge Orders          Ordered    amoxicillin-clavulanate (AUGMENTIN) 875-125 MG tablet  Every 12 hours         06/05/24 2001    azithromycin  (ZITHROMAX ) 250 MG tablet  Daily        06/05/24 RAYNOLD Garrick Charleston, MD 06/05/24 2129

## 2024-06-06 ENCOUNTER — Telehealth (HOSPITAL_COMMUNITY): Payer: Self-pay | Admitting: Emergency Medicine

## 2024-06-06 MED ORDER — AMOXICILLIN-POT CLAVULANATE 875-125 MG PO TABS: 1.0000 | ORAL_TABLET | Freq: Two times a day (BID) | ORAL | 0 refills | Status: AC

## 2024-06-06 MED ORDER — AZITHROMYCIN 250 MG PO TABS: 250.0000 mg | ORAL_TABLET | Freq: Every day | ORAL | 0 refills | Status: AC

## 2024-06-06 NOTE — Telephone Encounter (Signed)
 Pt requesting new pharmacy for rx, sent
